# Patient Record
Sex: Male | Born: 1935 | Race: White | Hispanic: No | Marital: Married | Smoking: Former smoker
Health system: Southern US, Community
[De-identification: ages and names within clinical notes are randomized; demographics above are authoritative.]

## PROBLEM LIST (undated history)

## (undated) DIAGNOSIS — Z951 Presence of aortocoronary bypass graft: Secondary | ICD-10-CM

## (undated) DIAGNOSIS — K5730 Diverticulosis of large intestine without perforation or abscess without bleeding: Secondary | ICD-10-CM

## (undated) DIAGNOSIS — E7800 Pure hypercholesterolemia, unspecified: Secondary | ICD-10-CM

## (undated) DIAGNOSIS — G4733 Obstructive sleep apnea (adult) (pediatric): Secondary | ICD-10-CM

## (undated) DIAGNOSIS — J42 Unspecified chronic bronchitis: Secondary | ICD-10-CM

## (undated) DIAGNOSIS — IMO0001 Reserved for inherently not codable concepts without codable children: Secondary | ICD-10-CM

## (undated) DIAGNOSIS — J449 Chronic obstructive pulmonary disease, unspecified: Secondary | ICD-10-CM

## (undated) DIAGNOSIS — Z950 Presence of cardiac pacemaker: Secondary | ICD-10-CM

## (undated) DIAGNOSIS — M545 Other chronic pain: Secondary | ICD-10-CM

## (undated) DIAGNOSIS — J180 Bronchopneumonia, unspecified organism: Secondary | ICD-10-CM

## (undated) DIAGNOSIS — R519 Headache, unspecified: Secondary | ICD-10-CM

## (undated) DIAGNOSIS — I739 Peripheral vascular disease, unspecified: Secondary | ICD-10-CM

## (undated) DIAGNOSIS — E669 Obesity, unspecified: Secondary | ICD-10-CM

## (undated) DIAGNOSIS — F419 Anxiety disorder, unspecified: Secondary | ICD-10-CM

## (undated) DIAGNOSIS — M1990 Unspecified osteoarthritis, unspecified site: Secondary | ICD-10-CM

## (undated) DIAGNOSIS — J209 Acute bronchitis, unspecified: Secondary | ICD-10-CM

## (undated) DIAGNOSIS — H9190 Unspecified hearing loss, unspecified ear: Secondary | ICD-10-CM

## (undated) DIAGNOSIS — I2510 Atherosclerotic heart disease of native coronary artery without angina pectoris: Secondary | ICD-10-CM

## (undated) DIAGNOSIS — R51 Headache: Secondary | ICD-10-CM

## (undated) DIAGNOSIS — G8929 Other chronic pain: Secondary | ICD-10-CM

## (undated) DIAGNOSIS — I10 Essential (primary) hypertension: Secondary | ICD-10-CM

## (undated) DIAGNOSIS — I779 Disorder of arteries and arterioles, unspecified: Secondary | ICD-10-CM

## (undated) DIAGNOSIS — I679 Cerebrovascular disease, unspecified: Secondary | ICD-10-CM

## (undated) DIAGNOSIS — D126 Benign neoplasm of colon, unspecified: Secondary | ICD-10-CM

## (undated) DIAGNOSIS — I4891 Unspecified atrial fibrillation: Secondary | ICD-10-CM

## (undated) DIAGNOSIS — I509 Heart failure, unspecified: Secondary | ICD-10-CM

## (undated) DIAGNOSIS — I442 Atrioventricular block, complete: Secondary | ICD-10-CM

## (undated) HISTORY — DX: Obstructive sleep apnea (adult) (pediatric): G47.33

## (undated) HISTORY — DX: Anxiety disorder, unspecified: F41.9

## (undated) HISTORY — DX: Diverticulosis of large intestine without perforation or abscess without bleeding: K57.30

## (undated) HISTORY — DX: Unspecified chronic bronchitis: J42

## (undated) HISTORY — DX: Obesity, unspecified: E66.9

## (undated) HISTORY — PX: EYE SURGERY: SHX253

## (undated) HISTORY — DX: Atherosclerotic heart disease of native coronary artery without angina pectoris: I25.10

## (undated) HISTORY — DX: Presence of cardiac pacemaker: Z95.0

## (undated) HISTORY — DX: Cerebrovascular disease, unspecified: I67.9

## (undated) HISTORY — DX: Presence of aortocoronary bypass graft: Z95.1

## (undated) HISTORY — DX: Atrioventricular block, complete: I44.2

## (undated) HISTORY — DX: Peripheral vascular disease, unspecified: I73.9

## (undated) HISTORY — DX: Bronchopneumonia, unspecified organism: J18.0

## (undated) HISTORY — DX: Heart failure, unspecified: I50.9

## (undated) HISTORY — DX: Unspecified atrial fibrillation: I48.91

## (undated) HISTORY — DX: Pure hypercholesterolemia, unspecified: E78.00

## (undated) HISTORY — PX: ADENOIDECTOMY: SUR15

## (undated) HISTORY — DX: Essential (primary) hypertension: I10

## (undated) HISTORY — DX: Acute bronchitis, unspecified: J20.9

## (undated) HISTORY — DX: Unspecified osteoarthritis, unspecified site: M19.90

## (undated) HISTORY — DX: Benign neoplasm of colon, unspecified: D12.6

## (undated) HISTORY — DX: Chronic obstructive pulmonary disease, unspecified: J44.9

## (undated) HISTORY — DX: Low back pain: M54.5

## (undated) HISTORY — DX: Other chronic pain: M54.50

## (undated) HISTORY — DX: Other chronic pain: G89.29

## (undated) HISTORY — DX: Disorder of arteries and arterioles, unspecified: I77.9

---
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
## 1994-08-18 DIAGNOSIS — D126 Benign neoplasm of colon, unspecified: Secondary | ICD-10-CM

## 1994-08-18 HISTORY — DX: Benign neoplasm of colon, unspecified: D12.6

## 1998-08-27 ENCOUNTER — Other Ambulatory Visit: Admission: RE | Admit: 1998-08-27 | Discharge: 1998-08-27 | Payer: Self-pay | Admitting: Gastroenterology

## 1999-10-19 HISTORY — PX: OTHER SURGICAL HISTORY: SHX169

## 2000-06-17 ENCOUNTER — Observation Stay (HOSPITAL_COMMUNITY): Admission: RE | Admit: 2000-06-17 | Discharge: 2000-06-18 | Payer: Self-pay | Admitting: *Deleted

## 2000-06-17 ENCOUNTER — Encounter: Payer: Self-pay | Admitting: *Deleted

## 2000-10-18 HISTORY — PX: CAROTID ENDARTERECTOMY: SUR193

## 2001-03-02 ENCOUNTER — Encounter: Payer: Self-pay | Admitting: *Deleted

## 2001-03-02 ENCOUNTER — Ambulatory Visit (HOSPITAL_COMMUNITY): Admission: RE | Admit: 2001-03-02 | Discharge: 2001-03-02 | Payer: Self-pay | Admitting: *Deleted

## 2001-03-22 ENCOUNTER — Encounter (INDEPENDENT_AMBULATORY_CARE_PROVIDER_SITE_OTHER): Payer: Self-pay | Admitting: Specialist

## 2001-03-22 ENCOUNTER — Inpatient Hospital Stay (HOSPITAL_COMMUNITY): Admission: RE | Admit: 2001-03-22 | Discharge: 2001-03-23 | Payer: Self-pay | Admitting: Vascular Surgery

## 2001-04-24 ENCOUNTER — Inpatient Hospital Stay (HOSPITAL_COMMUNITY): Admission: RE | Admit: 2001-04-24 | Discharge: 2001-04-25 | Payer: Self-pay | Admitting: Vascular Surgery

## 2001-07-07 ENCOUNTER — Ambulatory Visit (HOSPITAL_COMMUNITY): Admission: RE | Admit: 2001-07-07 | Discharge: 2001-07-08 | Payer: Self-pay | Admitting: *Deleted

## 2003-09-18 ENCOUNTER — Ambulatory Visit (HOSPITAL_BASED_OUTPATIENT_CLINIC_OR_DEPARTMENT_OTHER): Admission: RE | Admit: 2003-09-18 | Discharge: 2003-09-18 | Payer: Self-pay | Admitting: Pulmonary Disease

## 2003-10-08 ENCOUNTER — Ambulatory Visit (HOSPITAL_COMMUNITY): Admission: RE | Admit: 2003-10-08 | Discharge: 2003-10-08 | Payer: Self-pay | Admitting: *Deleted

## 2003-10-19 DIAGNOSIS — Z951 Presence of aortocoronary bypass graft: Secondary | ICD-10-CM

## 2003-10-19 HISTORY — PX: CORONARY ARTERY BYPASS GRAFT: SHX141

## 2003-10-19 HISTORY — DX: Presence of aortocoronary bypass graft: Z95.1

## 2003-10-25 ENCOUNTER — Encounter (INDEPENDENT_AMBULATORY_CARE_PROVIDER_SITE_OTHER): Payer: Self-pay | Admitting: Specialist

## 2003-10-25 ENCOUNTER — Inpatient Hospital Stay (HOSPITAL_COMMUNITY): Admission: RE | Admit: 2003-10-25 | Discharge: 2003-11-04 | Payer: Self-pay | Admitting: Cardiothoracic Surgery

## 2003-12-02 ENCOUNTER — Encounter (HOSPITAL_COMMUNITY): Admission: RE | Admit: 2003-12-02 | Discharge: 2004-03-01 | Payer: Self-pay | Admitting: *Deleted

## 2004-03-05 ENCOUNTER — Inpatient Hospital Stay (HOSPITAL_COMMUNITY): Admission: AD | Admit: 2004-03-05 | Discharge: 2004-03-07 | Payer: Self-pay | Admitting: *Deleted

## 2004-03-18 ENCOUNTER — Encounter (HOSPITAL_COMMUNITY): Admission: RE | Admit: 2004-03-18 | Discharge: 2004-06-16 | Payer: Self-pay | Admitting: *Deleted

## 2004-09-02 ENCOUNTER — Ambulatory Visit: Payer: Self-pay | Admitting: Pulmonary Disease

## 2004-09-15 ENCOUNTER — Ambulatory Visit: Payer: Self-pay | Admitting: Pulmonary Disease

## 2005-04-08 ENCOUNTER — Ambulatory Visit: Payer: Self-pay | Admitting: Pulmonary Disease

## 2005-09-16 ENCOUNTER — Ambulatory Visit: Payer: Self-pay | Admitting: Pulmonary Disease

## 2006-03-02 ENCOUNTER — Ambulatory Visit: Payer: Self-pay | Admitting: Pulmonary Disease

## 2006-03-10 ENCOUNTER — Ambulatory Visit: Payer: Self-pay | Admitting: Pulmonary Disease

## 2006-09-22 ENCOUNTER — Ambulatory Visit: Payer: Self-pay | Admitting: Pulmonary Disease

## 2006-09-22 LAB — CONVERTED CEMR LAB
ALT: 21 units/L (ref 0–40)
Albumin: 3.8 g/dL (ref 3.5–5.2)
Alkaline Phosphatase: 73 units/L (ref 39–117)
BUN: 23 mg/dL (ref 6–23)
Bacteria, U Microscopic: NEGATIVE /hpf
Basophils Absolute: 0 10*3/uL (ref 0.0–0.1)
Basophils Relative: 0.1 % (ref 0.0–1.0)
Calcium: 9.3 mg/dL (ref 8.4–10.5)
Cholesterol: 130 mg/dL (ref 0–200)
Epithelial cells, urine: NEGATIVE /lpf
Glomerular Filtration Rate, Af Am: 84 mL/min/{1.73_m2}
Glucose, Bld: 109 mg/dL — ABNORMAL HIGH (ref 70–99)
HDL: 32.2 mg/dL — ABNORMAL LOW (ref >39.0)
Hemoglobin, Urine: NEGATIVE
Hemoglobin: 16.6 g/dL (ref 13.0–17.0)
Hgb A1c MFr Bld: 5.6 % (ref 4.6–6.0)
LDL Cholesterol: 68 mg/dL (ref 0–99)
Lymphocytes Relative: 21.3 % (ref 12.0–46.0)
Monocytes Relative: 6.4 % (ref 3.0–11.0)
PSA: 1.3 ng/mL (ref 0.10–4.00)
Platelets: 155 10*3/uL (ref 150–400)
Potassium: 4.5 meq/L (ref 3.5–5.1)
RDW: 12.4 % (ref 11.5–14.6)
TSH: 0.98 microintl units/mL (ref 0.35–5.50)
Total Bilirubin: 1 mg/dL (ref 0.3–1.2)
Total Protein: 6.5 g/dL (ref 6.0–8.3)
WBC: 10.3 10*3/uL (ref 4.5–10.5)

## 2006-09-29 ENCOUNTER — Ambulatory Visit: Payer: Self-pay | Admitting: Gastroenterology

## 2006-10-04 ENCOUNTER — Encounter (INDEPENDENT_AMBULATORY_CARE_PROVIDER_SITE_OTHER): Payer: Self-pay | Admitting: *Deleted

## 2006-10-04 ENCOUNTER — Ambulatory Visit: Payer: Self-pay | Admitting: Gastroenterology

## 2006-12-05 ENCOUNTER — Ambulatory Visit: Payer: Self-pay | Admitting: Pulmonary Disease

## 2007-03-23 ENCOUNTER — Ambulatory Visit: Payer: Self-pay | Admitting: Pulmonary Disease

## 2007-07-06 ENCOUNTER — Ambulatory Visit (HOSPITAL_COMMUNITY): Admission: RE | Admit: 2007-07-06 | Discharge: 2007-07-06 | Payer: Self-pay | Admitting: Cardiovascular Disease

## 2007-09-26 ENCOUNTER — Ambulatory Visit: Payer: Self-pay | Admitting: Pulmonary Disease

## 2007-10-05 ENCOUNTER — Encounter: Admission: RE | Admit: 2007-10-05 | Discharge: 2007-10-18 | Payer: Self-pay | Admitting: Pulmonary Disease

## 2007-10-05 ENCOUNTER — Encounter: Payer: Self-pay | Admitting: Pulmonary Disease

## 2007-10-07 DIAGNOSIS — I739 Peripheral vascular disease, unspecified: Secondary | ICD-10-CM | POA: Insufficient documentation

## 2007-10-07 DIAGNOSIS — I679 Cerebrovascular disease, unspecified: Secondary | ICD-10-CM

## 2007-10-07 DIAGNOSIS — G4733 Obstructive sleep apnea (adult) (pediatric): Secondary | ICD-10-CM

## 2007-10-07 DIAGNOSIS — K5730 Diverticulosis of large intestine without perforation or abscess without bleeding: Secondary | ICD-10-CM | POA: Insufficient documentation

## 2007-10-07 DIAGNOSIS — K649 Unspecified hemorrhoids: Secondary | ICD-10-CM | POA: Insufficient documentation

## 2007-10-07 DIAGNOSIS — D126 Benign neoplasm of colon, unspecified: Secondary | ICD-10-CM

## 2007-10-07 DIAGNOSIS — F411 Generalized anxiety disorder: Secondary | ICD-10-CM | POA: Insufficient documentation

## 2007-10-07 DIAGNOSIS — M1990 Unspecified osteoarthritis, unspecified site: Secondary | ICD-10-CM | POA: Insufficient documentation

## 2007-10-07 DIAGNOSIS — M545 Low back pain: Secondary | ICD-10-CM | POA: Insufficient documentation

## 2007-10-07 DIAGNOSIS — E7800 Pure hypercholesterolemia, unspecified: Secondary | ICD-10-CM | POA: Insufficient documentation

## 2007-10-07 DIAGNOSIS — J449 Chronic obstructive pulmonary disease, unspecified: Secondary | ICD-10-CM

## 2007-10-07 DIAGNOSIS — I2510 Atherosclerotic heart disease of native coronary artery without angina pectoris: Secondary | ICD-10-CM | POA: Insufficient documentation

## 2007-10-07 DIAGNOSIS — I10 Essential (primary) hypertension: Secondary | ICD-10-CM

## 2007-10-07 LAB — CONVERTED CEMR LAB
ALT: 28 units/L (ref 0–53)
AST: 23 units/L (ref 0–37)
Alkaline Phosphatase: 64 units/L (ref 39–117)
BUN: 20 mg/dL (ref 6–23)
Basophils Relative: 0 % (ref 0.0–1.0)
Bilirubin, Direct: 0.1 mg/dL (ref 0.0–0.3)
CO2: 32 meq/L (ref 19–32)
Calcium: 9.5 mg/dL (ref 8.4–10.5)
Chloride: 102 meq/L (ref 96–112)
Creatinine, Ser: 1.1 mg/dL (ref 0.4–1.5)
Eosinophils Relative: 0.6 % (ref 0.0–5.0)
GFR calc Af Amer: 84 mL/min
Glucose, Bld: 121 mg/dL — ABNORMAL HIGH (ref 70–99)
Lymphocytes Relative: 21.7 % (ref 12.0–46.0)
Monocytes Relative: 7.5 % (ref 3.0–11.0)
Platelets: 163 10*3/uL (ref 150–400)
RBC: 5.24 M/uL (ref 4.22–5.81)
RDW: 12.5 % (ref 11.5–14.6)
Total Bilirubin: 0.9 mg/dL (ref 0.3–1.2)
Total Protein: 7 g/dL (ref 6.0–8.3)
WBC: 11 10*3/uL — ABNORMAL HIGH (ref 4.5–10.5)

## 2007-10-24 ENCOUNTER — Encounter: Admission: RE | Admit: 2007-10-24 | Discharge: 2007-11-02 | Payer: Self-pay | Admitting: Pulmonary Disease

## 2007-11-01 ENCOUNTER — Encounter: Payer: Self-pay | Admitting: Pulmonary Disease

## 2007-11-24 ENCOUNTER — Telehealth: Payer: Self-pay | Admitting: Pulmonary Disease

## 2007-12-06 ENCOUNTER — Encounter: Payer: Self-pay | Admitting: Pulmonary Disease

## 2007-12-07 ENCOUNTER — Encounter: Payer: Self-pay | Admitting: Pulmonary Disease

## 2007-12-28 ENCOUNTER — Ambulatory Visit: Payer: Self-pay | Admitting: Pulmonary Disease

## 2007-12-28 DIAGNOSIS — J441 Chronic obstructive pulmonary disease with (acute) exacerbation: Secondary | ICD-10-CM | POA: Insufficient documentation

## 2008-01-15 ENCOUNTER — Telehealth: Payer: Self-pay | Admitting: Pulmonary Disease

## 2008-01-29 ENCOUNTER — Ambulatory Visit: Payer: Self-pay | Admitting: Pulmonary Disease

## 2008-01-30 ENCOUNTER — Ambulatory Visit: Payer: Self-pay | Admitting: Cardiology

## 2008-02-02 ENCOUNTER — Inpatient Hospital Stay (HOSPITAL_COMMUNITY): Admission: EM | Admit: 2008-02-02 | Discharge: 2008-02-06 | Payer: Self-pay | Admitting: Emergency Medicine

## 2008-02-02 HISTORY — PX: CARDIAC CATHETERIZATION: SHX172

## 2008-02-04 ENCOUNTER — Telehealth: Payer: Self-pay | Admitting: Pulmonary Disease

## 2008-02-06 ENCOUNTER — Encounter: Payer: Self-pay | Admitting: Pulmonary Disease

## 2008-02-07 ENCOUNTER — Telehealth (INDEPENDENT_AMBULATORY_CARE_PROVIDER_SITE_OTHER): Payer: Self-pay | Admitting: *Deleted

## 2008-02-07 ENCOUNTER — Telehealth: Payer: Self-pay | Admitting: Pulmonary Disease

## 2008-04-08 ENCOUNTER — Ambulatory Visit: Payer: Self-pay | Admitting: Pulmonary Disease

## 2008-04-11 ENCOUNTER — Inpatient Hospital Stay (HOSPITAL_COMMUNITY): Admission: EM | Admit: 2008-04-11 | Discharge: 2008-04-19 | Payer: Self-pay | Admitting: Emergency Medicine

## 2008-04-12 DIAGNOSIS — Z950 Presence of cardiac pacemaker: Secondary | ICD-10-CM

## 2008-04-12 HISTORY — DX: Presence of cardiac pacemaker: Z95.0

## 2008-04-16 ENCOUNTER — Ambulatory Visit: Payer: Self-pay | Admitting: Internal Medicine

## 2008-04-17 HISTORY — PX: PACEMAKER PLACEMENT: SHX43

## 2008-04-26 ENCOUNTER — Encounter: Payer: Self-pay | Admitting: Pulmonary Disease

## 2008-05-04 ENCOUNTER — Inpatient Hospital Stay (HOSPITAL_COMMUNITY): Admission: EM | Admit: 2008-05-04 | Discharge: 2008-05-11 | Payer: Self-pay | Admitting: Pulmonary Disease

## 2008-05-15 ENCOUNTER — Encounter: Payer: Self-pay | Admitting: Pulmonary Disease

## 2008-08-26 ENCOUNTER — Ambulatory Visit: Payer: Self-pay | Admitting: Pulmonary Disease

## 2008-08-26 DIAGNOSIS — J180 Bronchopneumonia, unspecified organism: Secondary | ICD-10-CM | POA: Insufficient documentation

## 2008-10-01 ENCOUNTER — Ambulatory Visit: Payer: Self-pay | Admitting: Pulmonary Disease

## 2008-10-01 DIAGNOSIS — I4891 Unspecified atrial fibrillation: Secondary | ICD-10-CM

## 2008-10-01 DIAGNOSIS — Z950 Presence of cardiac pacemaker: Secondary | ICD-10-CM | POA: Insufficient documentation

## 2008-10-06 LAB — CONVERTED CEMR LAB
Albumin: 3.6 g/dL (ref 3.5–5.2)
Alkaline Phosphatase: 75 units/L (ref 39–117)
BUN: 23 mg/dL (ref 6–23)
Basophils Relative: 1.9 % (ref 0.0–3.0)
Calcium: 9.2 mg/dL (ref 8.4–10.5)
Cholesterol: 138 mg/dL (ref 0–200)
Creatinine, Ser: 1 mg/dL (ref 0.4–1.5)
Eosinophils Absolute: 0 10*3/uL (ref 0.0–0.7)
GFR calc Af Amer: 94 mL/min
GFR calc non Af Amer: 77 mL/min
Hemoglobin: 17.3 g/dL — ABNORMAL HIGH (ref 13.0–17.0)
Hgb A1c MFr Bld: 6.1 % — ABNORMAL HIGH (ref 4.6–6.0)
LDL Cholesterol: 60 mg/dL (ref 0–99)
Lymphocytes Relative: 20.1 % (ref 12.0–46.0)
MCHC: 34.8 g/dL (ref 30.0–36.0)
Monocytes Absolute: 0.5 10*3/uL (ref 0.1–1.0)
Monocytes Relative: 4.8 % (ref 3.0–12.0)
Neutro Abs: 7.2 10*3/uL (ref 1.4–7.7)
Neutrophils Relative %: 72.9 % (ref 43.0–77.0)
PSA: 1.54 ng/mL (ref 0.10–4.00)
Potassium: 3.8 meq/L (ref 3.5–5.1)
Pro B Natriuretic peptide (BNP): 203 pg/mL — ABNORMAL HIGH (ref 0.0–100.0)
RBC: 5.14 M/uL (ref 4.22–5.81)
RDW: 13.4 % (ref 11.5–14.6)
TSH: 0.9 microintl units/mL (ref 0.35–5.50)
Total CHOL/HDL Ratio: 4
VLDL: 44 mg/dL — ABNORMAL HIGH (ref 0–40)

## 2008-10-07 ENCOUNTER — Telehealth (INDEPENDENT_AMBULATORY_CARE_PROVIDER_SITE_OTHER): Payer: Self-pay | Admitting: *Deleted

## 2008-10-23 ENCOUNTER — Telehealth (INDEPENDENT_AMBULATORY_CARE_PROVIDER_SITE_OTHER): Payer: Self-pay | Admitting: *Deleted

## 2008-12-09 ENCOUNTER — Telehealth (INDEPENDENT_AMBULATORY_CARE_PROVIDER_SITE_OTHER): Payer: Self-pay | Admitting: *Deleted

## 2008-12-24 ENCOUNTER — Ambulatory Visit: Payer: Self-pay | Admitting: Pulmonary Disease

## 2008-12-31 ENCOUNTER — Ambulatory Visit: Payer: Self-pay | Admitting: Internal Medicine

## 2009-01-09 ENCOUNTER — Ambulatory Visit: Payer: Self-pay | Admitting: Pulmonary Disease

## 2009-01-09 ENCOUNTER — Telehealth (INDEPENDENT_AMBULATORY_CARE_PROVIDER_SITE_OTHER): Payer: Self-pay | Admitting: *Deleted

## 2009-01-10 LAB — CONVERTED CEMR LAB
ALT: 23 units/L (ref 0–53)
Alkaline Phosphatase: 58 units/L (ref 39–117)
BUN: 27 mg/dL — ABNORMAL HIGH (ref 6–23)
Basophils Relative: 3.4 % — ABNORMAL HIGH (ref 0.0–3.0)
Bilirubin, Direct: 0.2 mg/dL (ref 0.0–0.3)
Calcium: 9 mg/dL (ref 8.4–10.5)
Creatinine, Ser: 1.2 mg/dL (ref 0.4–1.5)
Eosinophils Relative: 0.4 % (ref 0.0–5.0)
GFR calc non Af Amer: 62.61 mL/min (ref >60)
Lymphocytes Relative: 18.3 % (ref 12.0–46.0)
Neutrophils Relative %: 70.9 % (ref 43.0–77.0)
Platelets: 140 10*3/uL — ABNORMAL LOW (ref 150.0–400.0)
RBC: 5.04 M/uL (ref 4.22–5.81)
Total Bilirubin: 0.9 mg/dL (ref 0.3–1.2)
Total Protein: 6.5 g/dL (ref 6.0–8.3)
WBC: 11.1 10*3/uL — ABNORMAL HIGH (ref 4.5–10.5)

## 2009-02-03 ENCOUNTER — Ambulatory Visit: Payer: Self-pay | Admitting: Pulmonary Disease

## 2009-03-31 ENCOUNTER — Ambulatory Visit: Payer: Self-pay | Admitting: Pulmonary Disease

## 2009-04-23 ENCOUNTER — Encounter: Payer: Self-pay | Admitting: Pulmonary Disease

## 2009-05-12 ENCOUNTER — Ambulatory Visit (HOSPITAL_COMMUNITY): Admission: RE | Admit: 2009-05-12 | Discharge: 2009-05-12 | Payer: Self-pay | Admitting: Cardiovascular Disease

## 2009-06-03 ENCOUNTER — Encounter: Payer: Self-pay | Admitting: Pulmonary Disease

## 2009-07-01 ENCOUNTER — Ambulatory Visit: Payer: Self-pay | Admitting: Pulmonary Disease

## 2009-08-14 ENCOUNTER — Encounter: Payer: Self-pay | Admitting: Pulmonary Disease

## 2009-08-21 ENCOUNTER — Encounter (INDEPENDENT_AMBULATORY_CARE_PROVIDER_SITE_OTHER): Payer: Self-pay | Admitting: *Deleted

## 2009-09-17 ENCOUNTER — Encounter: Payer: Self-pay | Admitting: Pulmonary Disease

## 2009-10-03 ENCOUNTER — Telehealth: Payer: Self-pay | Admitting: Pulmonary Disease

## 2009-10-07 ENCOUNTER — Ambulatory Visit: Payer: Self-pay | Admitting: Pulmonary Disease

## 2009-10-14 ENCOUNTER — Ambulatory Visit: Payer: Self-pay | Admitting: Pulmonary Disease

## 2009-10-14 LAB — CONVERTED CEMR LAB
ALT: 18 units/L (ref 0–53)
AST: 18 units/L (ref 0–37)
Albumin: 3.5 g/dL (ref 3.5–5.2)
Alkaline Phosphatase: 62 units/L (ref 39–117)
Basophils Absolute: 0 10*3/uL (ref 0.0–0.1)
Bilirubin, Direct: 0 mg/dL (ref 0.0–0.3)
CO2: 32 meq/L (ref 19–32)
Chloride: 104 meq/L (ref 96–112)
Cholesterol: 132 mg/dL (ref 0–200)
Eosinophils Relative: 0.6 % (ref 0.0–5.0)
Glucose, Bld: 90 mg/dL (ref 70–99)
HCT: 50.1 % (ref 39.0–52.0)
Hemoglobin: 16.9 g/dL (ref 13.0–17.0)
Lymphocytes Relative: 25 % (ref 12.0–46.0)
Lymphs Abs: 2.5 10*3/uL (ref 0.7–4.0)
Monocytes Relative: 9.3 % (ref 3.0–12.0)
PSA: 1.65 ng/mL (ref 0.10–4.00)
Platelets: 133 10*3/uL — ABNORMAL LOW (ref 150.0–400.0)
Potassium: 3.8 meq/L (ref 3.5–5.1)
RDW: 13.3 % (ref 11.5–14.6)
Sodium: 146 meq/L — ABNORMAL HIGH (ref 135–145)
Total CHOL/HDL Ratio: 4
Total Protein: 6.3 g/dL (ref 6.0–8.3)
Triglycerides: 253 mg/dL — ABNORMAL HIGH (ref 0.0–149.0)
VLDL: 50.6 mg/dL — ABNORMAL HIGH (ref 0.0–40.0)
WBC: 9.8 10*3/uL (ref 4.5–10.5)

## 2009-10-16 ENCOUNTER — Telehealth (INDEPENDENT_AMBULATORY_CARE_PROVIDER_SITE_OTHER): Payer: Self-pay | Admitting: *Deleted

## 2009-10-20 ENCOUNTER — Telehealth: Payer: Self-pay | Admitting: Pulmonary Disease

## 2010-02-09 ENCOUNTER — Encounter: Payer: Self-pay | Admitting: Pulmonary Disease

## 2010-02-16 ENCOUNTER — Encounter: Payer: Self-pay | Admitting: Pulmonary Disease

## 2010-02-24 ENCOUNTER — Telehealth: Payer: Self-pay | Admitting: Pulmonary Disease

## 2010-03-09 IMAGING — CT CT HEAD W/O CM
1 series · 16 of 30 positions shown, 20 images · non-contrast
Comparison: None

CLINICAL DATA: Syncope and headache

CT HEAD WITHOUT CONTRAST
TECHNIQUE: Contiguous axial images were obtained from the base of
the skull through the vertex without contrast.

[Series 2: brain · axial · 0.47mm/px · z∈[+120,+275]mm · 16 of 32 slices shown, 20 images]
[im 2/32  brain]
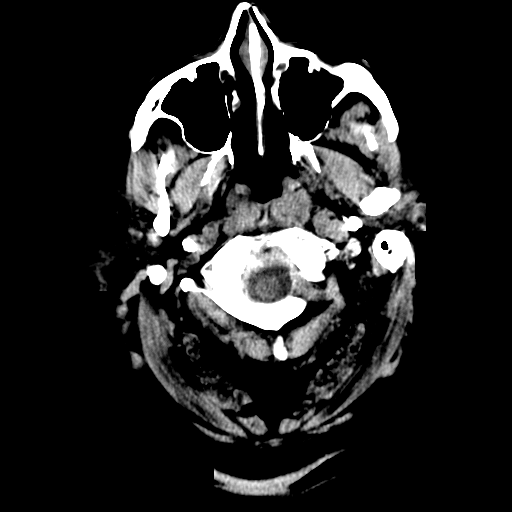
[im 2/32  bone]
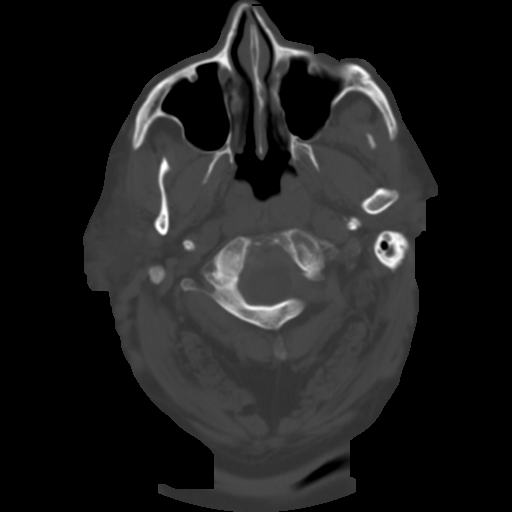
[im 4/32  brain]
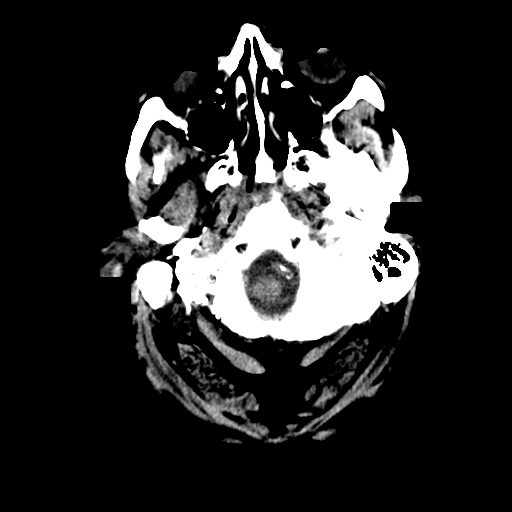
[im 6/32  brain]
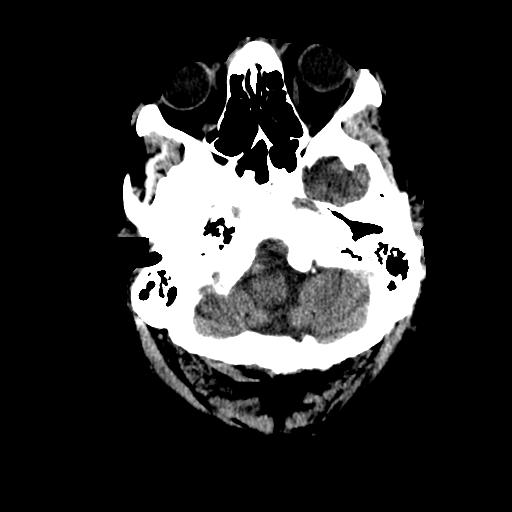
[im 8/32  brain]
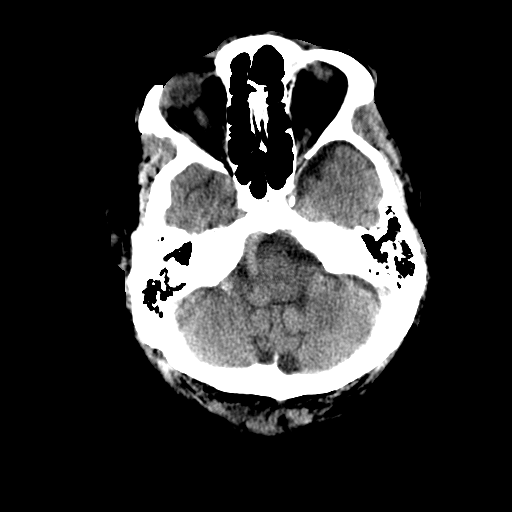
[im 9/32  brain]
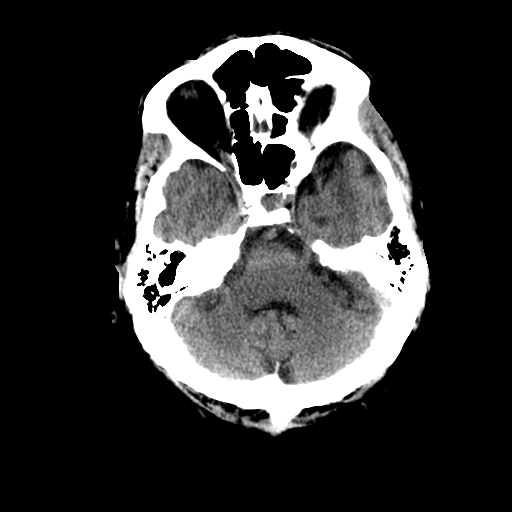
[im 9/32  bone]
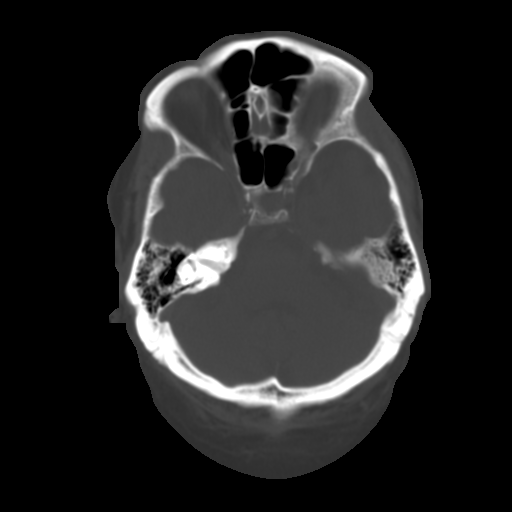
[im 11/32  brain]
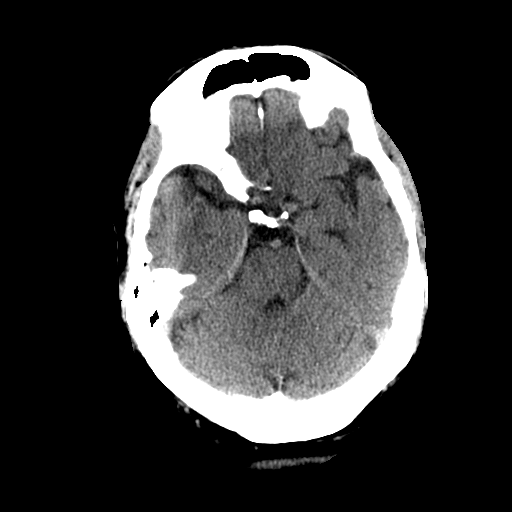
[im 13/32  brain]
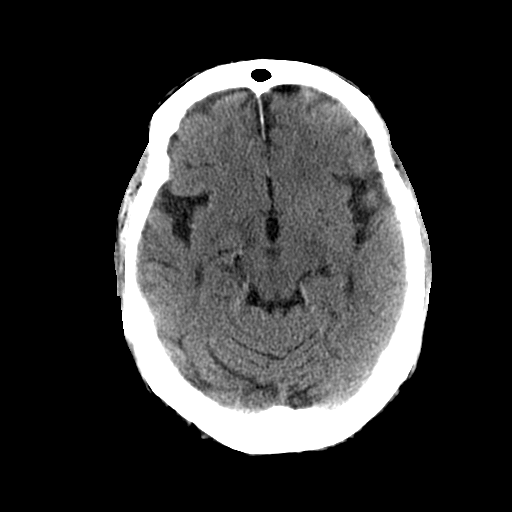
[im 15/32  brain]
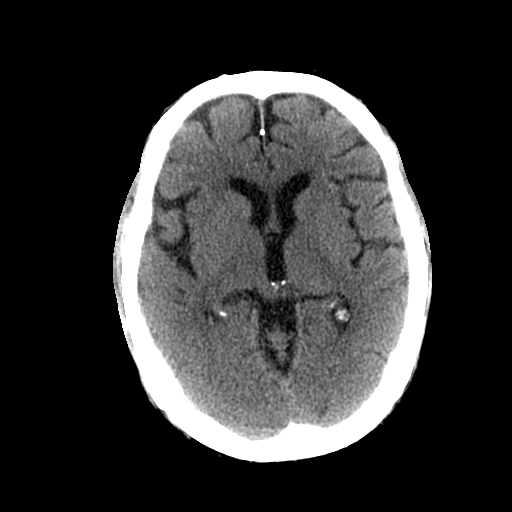
[im 17/32  brain]
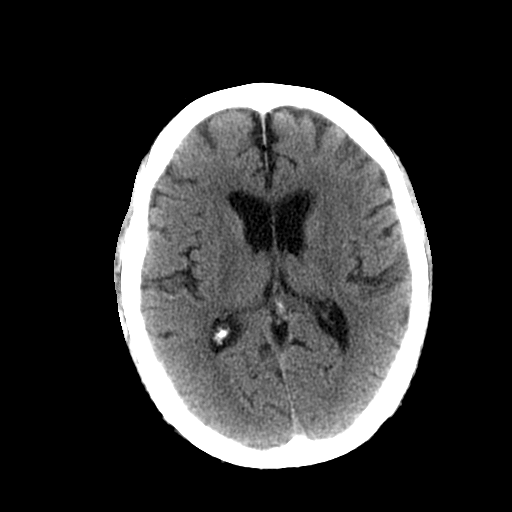
[im 17/32  bone]
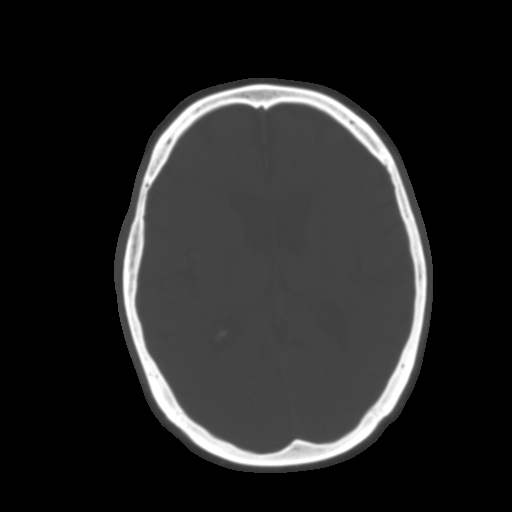
[im 19/32  brain]
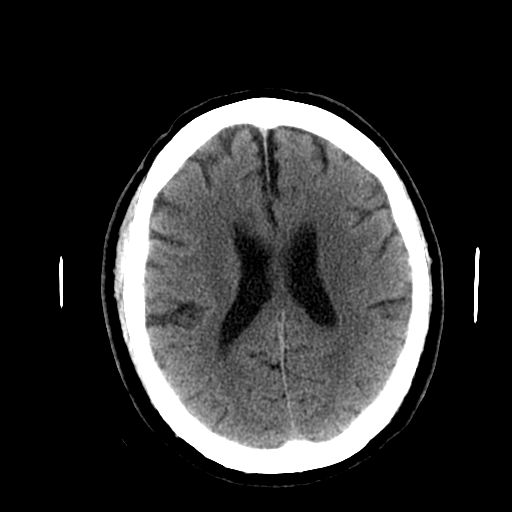
[im 21/32  brain]
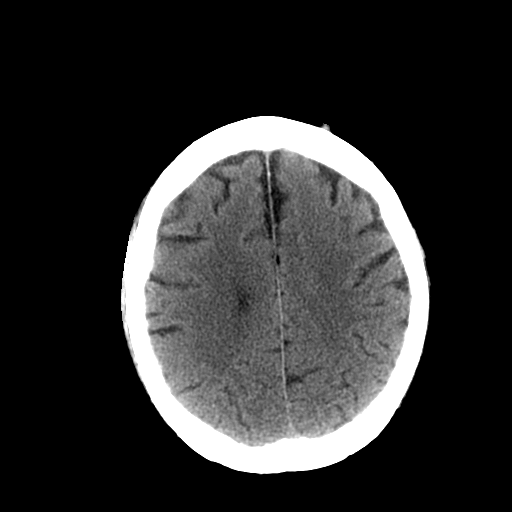
[im 23/32  brain]
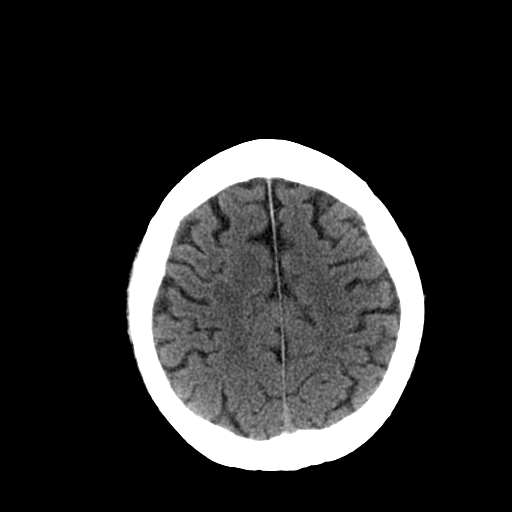
[im 24/32  brain]
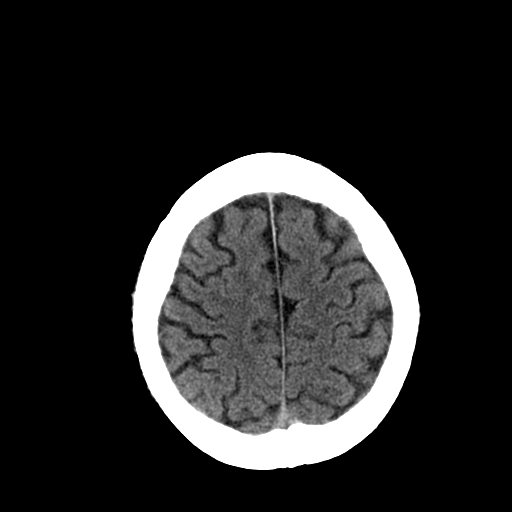
[im 24/32  bone]
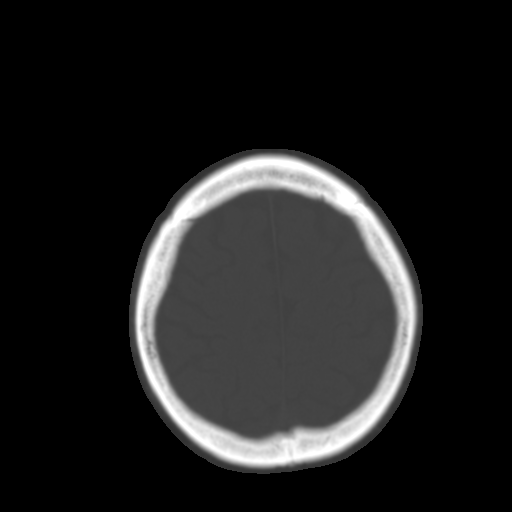
[im 26/32  brain]
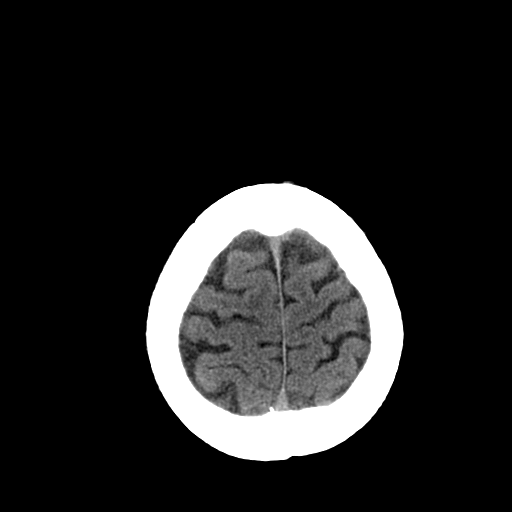
[im 28/32  brain]
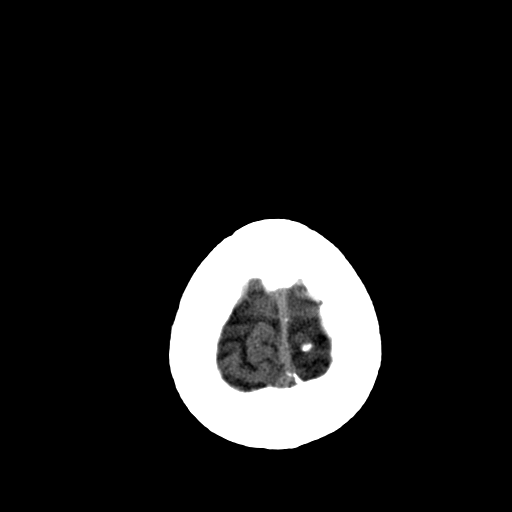
[im 30/32  brain]
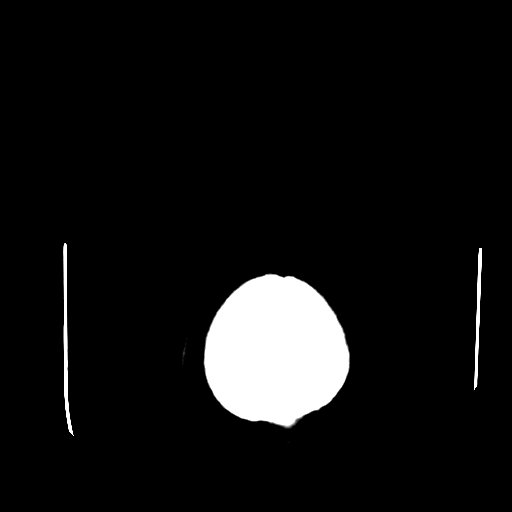

[16 of 30 positions shown; findings below may reference images not displayed]

FINDINGS: There is no intra or extra-axial hemorrhage.  The
ventricles are normal in size.  No evidence of acute infarct, mass
effect, or midline shift.  The calvarium is intact.  The visualized
paranasal sinuses and mastoid air cells are clear.
IMPRESSION: No evidence of acute intracranial abnormality.

## 2010-04-02 ENCOUNTER — Telehealth: Payer: Self-pay | Admitting: Gastroenterology

## 2010-04-13 ENCOUNTER — Ambulatory Visit: Payer: Self-pay | Admitting: Pulmonary Disease

## 2010-08-06 ENCOUNTER — Encounter: Payer: Self-pay | Admitting: Pulmonary Disease

## 2010-08-17 ENCOUNTER — Encounter: Payer: Self-pay | Admitting: Pulmonary Disease

## 2010-09-04 HISTORY — PX: NM MYOVIEW LTD: HXRAD82

## 2010-09-25 ENCOUNTER — Encounter: Payer: Self-pay | Admitting: Pulmonary Disease

## 2010-10-14 ENCOUNTER — Ambulatory Visit: Payer: Self-pay | Admitting: Pulmonary Disease

## 2010-10-14 ENCOUNTER — Ambulatory Visit
Admission: RE | Admit: 2010-10-14 | Discharge: 2010-10-14 | Payer: Self-pay | Source: Home / Self Care | Attending: Pulmonary Disease | Admitting: Pulmonary Disease

## 2010-10-19 LAB — CONVERTED CEMR LAB
ALT: 21 units/L (ref 0–53)
AST: 23 units/L (ref 0–37)
BUN: 23 mg/dL (ref 6–23)
CO2: 30 meq/L (ref 19–32)
Chloride: 104 meq/L (ref 96–112)
Cholesterol: 144 mg/dL (ref 0–200)
Creatinine, Ser: 0.9 mg/dL (ref 0.4–1.5)
Eosinophils Absolute: 0.1 10*3/uL (ref 0.0–0.7)
Eosinophils Relative: 0.5 % (ref 0.0–5.0)
Glucose, Bld: 103 mg/dL — ABNORMAL HIGH (ref 70–99)
HDL: 33.3 mg/dL — ABNORMAL LOW (ref >39.00)
LDL Cholesterol: 71 mg/dL (ref 0–99)
Lymphocytes Relative: 21.6 % (ref 12.0–46.0)
MCV: 93.7 fL (ref 78.0–100.0)
Monocytes Absolute: 0.8 10*3/uL (ref 0.1–1.0)
Neutrophils Relative %: 70.8 % (ref 43.0–77.0)
Platelets: 149 10*3/uL — ABNORMAL LOW (ref 150.0–400.0)
Potassium: 3.9 meq/L (ref 3.5–5.1)
Total Bilirubin: 0.7 mg/dL (ref 0.3–1.2)
Total Protein: 6.4 g/dL (ref 6.0–8.3)
Triglycerides: 200 mg/dL — ABNORMAL HIGH (ref 0.0–149.0)
WBC: 11.2 10*3/uL — ABNORMAL HIGH (ref 4.5–10.5)

## 2010-11-17 NOTE — Progress Notes (Signed)
Summary: Schedule Colonoscopy  Phone Note Outgoing Call Call back at Home Phone [phone]   Call placed by: Harlow Mares CMA Duncan Dull),  April 02, 2010 8:47 AM Call placed to: Patient Summary of Call: Left message on patients machine to call back. patient is due for colonoscopy Initial call taken by: Harlow Mares CMA Duncan Dull),  April 02, 2010 8:47 AM  Follow-up for Phone Call        Left a message on the patient machine to call back and schedule a previsit and procedure with our office. A letter will be mailed to the patient.   Follow-up by: Harlow Mares CMA (AAMA),  April 23, 2010 3:01 PM

## 2010-11-17 NOTE — Progress Notes (Signed)
Summary: rx request/ flu-like symptoms  Phone Note Call from Patient   Caller: Spouse Call For: nadel Summary of Call: pt's spouse says that pt is still in bed- not feeling any better from cold than he did when he was last seen recently for cpx. coughing/ chills/ low-grade fever. spouse is [facility] [address] (spouse says she doesn't care that ins. won't cover this- says doxicycline just isn't helping). call spouse at work # [phone] Initial call taken by: Tivis Ringer,  October 20, 2009 10:45 AM  Follow-up for Phone Call        Please advise. Zackery Barefoot CMA  October 20, 2009 12:35 P   per SN----change to avelox  400mg   #7  1 by mouth once daily until gone and refill x 1.  thanks Randell Loop CMA  October 21, 2009 12:21 PM   rx sent. pt aware.Carron Curie CMA  October 21, 2009 12:43 PM     New/Updated Medications: AVELOX 400 MG TABS (MOXIFLOXACIN HCL) Take 1 tablet by mouth once a day Prescriptions: AVELOX 400 MG TABS (MOXIFLOXACIN HCL) Take 1 tablet by mouth once a day  #7 x 1   Entered by:   Carron Curie CMA   Authorized by:   Michele Mcalpine MD   Signed by:   Carron Curie CMA on 10/21/2009   Method used:   [facility] to        [facility] [address] [phone]* (retail)       [address]       [place], [place]  [address]       Ph: [phone] or [phone]       Fax: [phone]   RxID:   [id number]

## 2010-11-17 NOTE — Assessment & Plan Note (Signed)
Summary: 6 MONTHS/ MBW   CC:  6 month ROV & review of mult medical problems....  History of Present Illness: 77 y/o WM here for a follow up visit... he has mult med problems as noted below...  Followed for COPD/ AB/ ex-smoker, HBP/ ASHD/ AFib/ Pacer- followed by DrBerry/ [facility], cerebrovasc & peripheral vasc disease, Hyperlipidemia/ Obesity, DJD/ LBP, Anxiety, etc...   ~  October 14, 2009:  here for 3mo ROV w/ another exac- cough, congestion, sore throat, brown sputum, & incr SOB w/ wheezing etc... doesn't want Avelox due to cost- discussed trying Depo80/ Medrol dosepak, Doxycycline + his Symbicort/ Spiriva/ Singulair/ Mucinex regularly w/ fluids etc... he has gained to 252# & MUST diet, incr exercise & get weight down if he wants to survive...    ~  April 13, 2010:  no luck w/ weight reduction but then again he is not really on diet or exercising & we discussed this... [facility]- pacer function normally & 2DEcho showed mod conc LVH, mod reduced LVF w/ EF= 35-40% w/ apical AK, dil RA... he is c/o a skin lesion ?SK? on vertex & I advised excision by Derm (he wants to call on his own to set this up)... needs refill Vicodin & Xanax...    Current Problem List:  BRONCHITIS, CHRONIC, ACUTE EXACERBATION (ICD-491.21) >> see prev notes... COPD (ICD-496) - ex-smoker, freq flairs when off Rx... now taking: SYMBICORT 160- 2spBid,  SPIRIVA daily, SINGULAIR 10mg /d,  MUCINEX 1-2 Bid w/ fluids, + FLONASE...  ~  baseline CXR shows COPD, prev median sternotomy, NAD.Marland KitchenMarland Kitchen   ~   CT CHEST 4/09 w/ extensive coronary calcif, prior CABG, no signif abn in the lungs.  ~  PFT's 4/09 showed FVC=2.88 (71%), FEV1=2.13 (67%), FEV1/ FVC= 74%, mid-flows= 51%pred...  ~  CXR 3/10 showed sl cardiomeg, pleural thickening, pacer, DJD sp, NAD...  OBSTRUCTIVE SLEEP APNEA (ICD-327.23) - he does not tolerate his CPAP...  HYPERTENSION (ICD-401.9) controlled on meds:  METOPROLOL 50mg Bid, COZAAR 100/d, CARDIZEM CD 180mg /d,  & HCTZ 12.5mg /d... BP today = 150/80 and not really checking BP at home as requested... advised to elim sodium, get on deit & get weight down!!!  ARTERIOSCLEROTIC HEART DISEASE (ICD-414.00) - follow by [facility]... he is s/p CABG 1/05... he is on ASA 81mg /d,  now COUMADIN, + meds as listed above... note 2DEcho w/ LVD & EF= 35-40%...  ~   CT CHEST 4/09 w/ extensive coronary calcif, prior CABG, no signif abn in the lungs.  ~  cath 4/09 showed patent grafts, some disease in Circ & RCA, patent stent to left renal art... med Rx.   ~  2DEcho 5/11 showed mod conc LVH, mod reduced LVF w/ EF= 35-40% w/ apical AK, dil RA...  ATRIAL FIBRILLATION (ICD-427.31) CARDIAC PACEMAKER IN SITU (ICD-V45.01) - he had syncope w/ 4 fx right ribs and CHB & AFib requiring pacer & Coumadin started... [facility] Dr[facility]... seen 4/11 & pacer check OK, no changes made...  CEREBROVASCULAR DISEASE (ICD-437.9) - S/P Bilat CAE's 2000 & doppler's are followed by DrBerry...   ~  last CDoppler reported by [facility] 4/09 showed mod distal right CCA stenosis.  PERIPHERAL VASCULAR DISEASE (ICD-443.9) - S/P Rt fem art thrombectomy 2000, and Rt renal art stent 2002... he has all this followed by [facility] now & last doppler & cath showed patent stent.  HYPERCHOLESTEROLEMIA (ICD-272.0) - controlled on SIMVASTATIN 80mg /d & Fish Oil and labs followed by DrBerry (pt will ask for copies to be  sent to [place])... he recently added NIASPAN for HDL of 31, but this was stopped due to flushing/ intol...   ~  FLP 12/09 showed TChol 138, TG 220, HDL 34, LDL 60  ~  FLP 12/10 showed TChol 132, TG 253, HDL 33, LDL 61  OBESITY (ICD-278.00) - weight fluctuates betw 240-250# & he has been unable to lose weight... he is sedentary and we discussed an exercise program for him, since he is limited by LBP.  ~  NOTE:  labs 12/09 showed BS= 131, HgA1c= 6.1.[name]. advised low carb diet, get wt down!  ~  6/10: wt today= 249#, but pt states down to 233# at home- "just decr  portions"  ~  12/10:  wt today= 252# post holiday... he states "I'm starving myself to death"...  ~  04/08/23:  weight = 252#.[name]. states he's lost 4" at the waist...  DIVERTICULOSIS OF COLON (ICD-562.10) COLONIC POLYPS (ICD-211.3) HEMORRHOIDS (ICD-455.6)  ~  last colonoscopy was 12/07 by DrStark and showed divertic, polyps 2-2mm size (ademomas), and hems... f/u planned 44yrs.  DEGENERATIVE JOINT DISEASE (ICD-715.90) LOW BACK PAIN, CHRONIC (ICD-724.2)  ~  he uses DCN 100 for pain and prev requested refill of #360 for 3 month supply...  ~  12/10: DCN now off the market & Rx for Vicodin #270- up to 3/d as needed for pain.  ANXIETY (ICD-300.00) - he uses ALPRAZOLAM 0.5mg  Tid and prev requested #270 for 3 month supply...   Preventive Screening-Counseling & Management  Alcohol-Tobacco     Smoking Status: quit     Year Quit: 1998  Allergies: 1)  ! Pcn  Comments:  Nurse/Medical Assistant: The patient's medications and allergies were reviewed with the patient and were updated in the Medication and Allergy Lists.  Past History:  Past Medical History: OBSTRUCTIVE SLEEP APNEA (ICD-327.23) Hx of BRONCHIAL PNEUMONIA (ICD-485) BRONCHITIS, CHRONIC, ACUTE EXACERBATION (ICD-491.21) COPD (ICD-496) HYPERTENSION (ICD-401.9) ARTERIOSCLEROTIC HEART DISEASE (ICD-414.00) ATRIAL FIBRILLATION (ICD-427.31) CARDIAC PACEMAKER IN SITU (ICD-V45.01) CEREBROVASCULAR DISEASE (ICD-437.9) PERIPHERAL VASCULAR DISEASE (ICD-443.9) HYPERCHOLESTEROLEMIA (ICD-272.0) OBESITY (ICD-278.00) DIVERTICULOSIS OF COLON (ICD-562.10) COLONIC POLYPS (ICD-211.3) HEMORRHOIDS (ICD-455.6) DEGENERATIVE JOINT DISEASE (ICD-715.90) LOW BACK PAIN, CHRONIC (ICD-724.2) ANXIETY (ICD-300.00)  Past Surgical History: S/P PTA to right common femoral artery by drGupta in 2001 S/P bilat carotid endarterectomies in 2002 by DrLawson S/P CABG in 2005 S/P pacemaker for CHB 7/09 by DrBerry  Family History: Reviewed history from 07/01/2009  and no changes required. asthma - paternal uncle heart disease - PGM, mother, father had MI rheumatism - father, paternal uncle cancer - mother (pancreatic) hypercholesterolemia - mother  Social History: Reviewed history from 07/01/2009 and no changes required. Retired - worked for medical supply Former Smoker---quit in 1998---smoked 1 1/2 ppd no alcohol drinks 1 cup of caffeine married no children  Review of Systems      See HPI       The patient complains of decreased hearing, dyspnea on exertion, and difficulty walking.  The patient denies anorexia, fever, weight loss, weight gain, vision loss, hoarseness, chest pain, syncope, peripheral edema, prolonged cough, headaches, hemoptysis, abdominal pain, melena, hematochezia, severe indigestion/heartburn, hematuria, incontinence, muscle weakness, suspicious skin lesions, transient blindness, depression, unusual weight change, abnormal bleeding, enlarged lymph nodes, and angioedema.    Vital Signs:  Patient profile:   77 year old male Height:      69 inches Weight:      252 pounds BMI:     37.35 O2 Sat:      96 % on Room air Temp:  98.1 degrees F oral Pulse rate:   81 / minute BP sitting:   150 / 80  (left arm) Cuff size:   regular  Vitals Entered By: Randell Loop CMA (April 13, 2010 10:16 AM)  O2 Sat at Rest %:  96 O2 Flow:  Room air CC: 6 month ROV & review of mult medical problems... Is Patient Diabetic? No Pain Assessment Patient in pain? no      Comments meds updated today with pt   Physical Exam  Additional Exam:  WD, Obese, 77 y/o WM in NAD... GENERAL:  Alert & oriented; pleasant & cooperative... HEENT:  [place]/AT, EOM-full, PERRLA, TMs-wnl, NOSE-clear, THROAT- sl red w/o exud... NECK:  Supple w/ fair ROM; no JVD; s/p bilat CAE's w/ bruits; no thyromegaly or nodules palpated; no lymphadenopathy. CHEST:  decr BS bilat w/ few scat rhonchi & wheezes, no rales, no signs of consolidation... HEART:  Regular Rhythm;  without murmurs/ rubs/ or gallops- median sternotomy scar...pacemaker in place ABDOMEN:  Obese, soft & nontender; normal bowel sounds; no organomegaly or masses detected. EXT: warm and dry, mild arthritic changes; no varicose veins/ +venous insuffic/ no edema. NEURO:  CN's intact; no focal neuro deficits...  DERM: no lesions seen, no rash etc...    Impression & Recommendations:  Problem # 1:  BRONCHITIS, CHRONIC, ACUTE EXACERBATION (ICD-491.21) COPD, AB>  improved if he says on his meds regularly- Symbicort, Spiriva, Singulaire, Mucinex...  Problem # 2:  HYPERTENSION (ICD-401.9) Controleed>  same meds, must get weight down!!! His updated medication list for this problem includes:    Metoprolol Tartrate 50 Mg Tabs (Metoprolol tartrate) .[name]... Take 1 tabs by mouth two times a day.    Cardizem Cd 180 Mg Xr24h-cap (Diltiazem hcl coated beads) .[name]... Take 1 capsule by mouth once a day    Cozaar 100 Mg Tabs (Losartan potassium) .[name]... Take 1 tablet by mouth once a day    Hydrochlorothiazide 12.5 Mg Tabs (Hydrochlorothiazide) .[name]... Take 1 tablet by mouth once a day  Problem # 3:  ARTERIOSCLEROTIC HEART DISEASE (ICD-414.00) Followed by [facility]>  HBP, ASHD, LVDysfunction, Hx AFib, Pacer... continue their Rx including Coumadin. His updated medication list for this problem includes:    Adult Aspirin Low Strength 81 Mg Tbdp (Aspirin) .[name]... Take 1 tablet by mouth once a day    Metoprolol Tartrate 50 Mg Tabs (Metoprolol tartrate) .[name]... Take 1 tabs by mouth two times a day.    Cardizem Cd 180 Mg Xr24h-cap (Diltiazem hcl coated beads) .[name]... Take 1 capsule by mouth once a day    Cozaar 100 Mg Tabs (Losartan potassium) .[name]... Take 1 tablet by mouth once a day    Hydrochlorothiazide 12.5 Mg Tabs (Hydrochlorothiazide) .[name]... Take 1 tablet by mouth once a day  Problem # 4:  CEREBROVASCULAR DISEASE (ICD-437.9) He is a vasculopath>  cerebrovasc & periph vasc dis... on COUMADIN per [facility].  Problem # 5:   HYPERCHOLESTEROLEMIA (ICD-272.0) Stable-  same Rx. His updated medication list for this problem includes:    Simvastatin 80 Mg Tabs (Simvastatin) .[name]... Take 1 tab by mouth at bedtime  Problem # 6:  OBESITY (ICD-278.00) Weight reduction is critically important but he doesn't see it> discussed w/ pt & advised diet + exercise.  Problem # 7:  DEGENERATIVE JOINT DISEASE (ICD-715.90) Vicodin refilled per request... His updated medication list for this problem includes:    Adult Aspirin Low Strength 81 Mg Tbdp (Aspirin) .[name]... Take 1 tablet by mouth once a day    Vicodin 5-500  Mg Tabs (Hydrocodone-acetaminophen) .[name]... Take 1 tab by mouth every 8-12 h as needed for pain... not to exceed 3 per day.  Problem # 8:  ANXIETY (ICD-300.00) Alpraz refilled per request... His updated medication list for this problem includes:    Xanax 0.5 Mg Tabs (Alprazolam) .[name]... Take 1 tablet by mouth three times a day as needed for nerves.... not to exceed 3 per day.  Complete Medication List: 1)  Fluticasone Propionate 50 Mcg/act Susp (Fluticasone propionate) .[name].. 1 spray each nostril two times a day 2)  Symbicort 160-4.5 Mcg/act Aero (Budesonide-formoterol fumarate) .... 2 sprays two times a day... 3)  Spiriva Handihaler 18 Mcg Caps (Tiotropium bromide monohydrate) .... Inhale the contents of one capsule via handihaler daily.Marland KitchenMarland Kitchen 4)  Singulair 10 Mg Tabs (Montelukast sodium) .... Take 1 tab by mouth once daily as directed... 5)  Mucinex 600 Mg Tb12 (Guaifenesin) .... Take 1-2 tabs by mouth two times a day w/ fluids for thick phlegm... 6)  Promethazine-codeine 6.25-10 Mg/64ml Syrp (Promethazine-codeine) .[name].. 1-2 tsp every 4-6 hr as needed cough 7)  Adult Aspirin Low Strength 81 Mg Tbdp (Aspirin) .... Take 1 tablet by mouth once a day 8)  Warfarin Sodium 5 Mg Tabs (Warfarin sodium) .... As directed by drberry 9)  Metoprolol Tartrate 50 Mg Tabs (Metoprolol tartrate) .... Take 1 tabs by mouth two times a day. 10)  Cardizem  Cd 180 Mg Xr24h-cap (Diltiazem hcl coated beads) .... Take 1 capsule by mouth once a day 11)  Cozaar 100 Mg Tabs (Losartan potassium) .... Take 1 tablet by mouth once a day 12)  Hydrochlorothiazide 12.5 Mg Tabs (Hydrochlorothiazide) .... Take 1 tablet by mouth once a day 13)  Simvastatin 80 Mg Tabs (Simvastatin) .... Take 1 tab by mouth at bedtime 14)  Fish Oil Concentrate 1000 Mg Caps (Omega-3 fatty acids) .... Take 1 tab by mouth at bedtime 15)  Prilosec Otc 20 Mg Tbec (Omeprazole magnesium) .... Take 1 tablet by mouth once a day 16)  Miralax Powd (Polyethylene glycol 3350) .... Once daily 17)  Colace 100 Mg Caps (Docusate sodium) .... Take 2 tablets by mouth once daily 18)  Vicodin 5-500 Mg Tabs (Hydrocodone-acetaminophen) .... Take 1 tab by mouth every 8-12 h as needed for pain... not to exceed 3 per day. 19)  Xanax 0.5 Mg Tabs (Alprazolam) .... Take 1 tablet by mouth three times a day as needed for nerves.... not to exceed 3 per day. 20)  Meclizine Hcl 25 Mg Tabs (Meclizine hcl) .... 1/2 to 1 tab by mouth every 6h as needed for dizziness... 21)  Proctosol Hc 2.5 % Crea (Hydrocortisone) .... Apply as directed... 22)  Coq10 100 Mg Caps (Coenzyme q10) .... Take 1 tablet by mouth once a day  Patient Instructions: 1)  Today we updated your med list- see below.... 2)  Continue your current medications the same... 3)  Today we refilled your Vicodin & Xanax per request... 4)  Keep up the good work w/ diet + exercise... 5)  Call for any questions.Marland KitchenMarland Kitchen 6)  Please schedule a follow-up appointment in 6 months, & we will plan FASTING blood work at that time... Prescriptions: XANAX 0.5 MG  TABS (ALPRAZOLAM) Take 1 tablet by mouth three times a day as needed for nerves.... not to exceed 3 per day.  #270 x 1   Entered and Authorized by:   Michele Mcalpine MD   Signed by:   Michele Mcalpine MD on 04/13/2010   Method used:   Print  then Give to Patient   RxID:   [id number] VICODIN 5-500 MG TABS  (HYDROCODONE-ACETAMINOPHEN) take 1 tab by mouth every 8-12 H as needed for pain... not to exceed 3 per day.  #270 x 1   Entered and Authorized by:   Michele Mcalpine MD   Signed by:   Michele Mcalpine MD on 04/13/2010   Method used:   Print then Give to Patient   RxID:   [id number]

## 2010-11-17 NOTE — Consult Note (Signed)
Summary: [facility]  [facility]   Imported By: Sherian Rein 02/17/2010 13:19:13  _____________________________________________________________________  External Attachment:    Type:   Image     Comment:   External Document

## 2010-11-17 NOTE — Letter (Signed)
Summary: Pre-Op Clearance/[facility]  Pre-Op Clearance/[facility]   Imported By: Sherian Rein 08/25/2010 09:21:57  _____________________________________________________________________  External Attachment:    Type:   Image     Comment:   External Document

## 2010-11-17 NOTE — Progress Notes (Signed)
Summary: handicapp sticker  Phone Note Call from Patient Call back at [phone]   Caller: Spouse Call For: Laberta Wilbon Summary of Call: pt purchased another car need note to get handicapp sticker. will pick up Initial call taken by: Rickard Patience,  Feb 24, 2010 10:25 AM  Follow-up for Phone Call        called and spoke with pt's spouse.  spouse states pt recently got a new car and they didn't want to trade the tags on the old car and therefore got new tags and now need a new handicap form filled out.  Will forward message to SN to address.  Aundra Millet Reynolds LPN  Feb 24, 2010 10:40 AM   Additional Follow-up for Phone Call Additional follow up Details #1::        called and spoke with pts wife and she will stop by this afternoon and pick this up for the pt.  left placard up front for her. Randell Loop CMA  Feb 24, 2010 3:06 PM

## 2010-11-19 NOTE — Assessment & Plan Note (Signed)
Summary: 6 months w/labs/apc   CC:  6 month ROV & review of mult medical problems....  History of Present Illness: 77 y/o WM here for a follow up visit... he has mult med problems as noted below...  Followed for COPD/ AB/ ex-smoker, HBP/ ASHD/ AFib/ Pacer- followed by DrSolomon/ [facility], cerebrovasc & peripheral vasc disease, Hyperlipidemia/ Obesity, DJD/ LBP, Anxiety, etc...   ~  October 14, 2009:  here for 68mo ROV w/ another exac- cough, congestion, sore throat, brown sputum, & incr SOB w/ wheezing etc... doesn't want Avelox due to cost- discussed trying Depo80/ Medrol dosepak, Doxycycline + his Symbicort/ Spiriva/ Singulair/ Mucinex regularly w/ fluids etc... he has gained to 252# & MUST diet, incr exercise & get weight down if he wants to survive...    ~  April 13, 2010:  no luck w/ weight reduction but then again he is not really on diet or exercising & we discussed this... he saw DrSolomon [facility] 4/11- pacer function normally & 2DEcho showed mod conc LVH, mod reduced LVF w/ EF= 35-40% w/ apical AK, dil RA... he is c/o a skin lesion ?SK? on vertex & I advised excision by Derm (he wants to call on his own to set this up)... needs refill Vicodin & Xanax...   ~  October 14, 2010:  68mo ROV- c/o recent dental issues, otherw Ok  w/ f/u DrSolomon 10/11- he had CDoppler & Nuclear Stress Test which were OK according to the pt (we don't have these reports)... weight remains 252# (no change);  BP controlled on meds;  denies CP, palpit edema;  remains in AFib on Coumadin per [facility];  he is on Simva80- & they want him to continue this med & this dose... meds refilled per request.    Current Problem List:  BRONCHITIS, CHRONIC, ACUTE EXACERBATION (ICD-491.21) >> see prev notes... COPD (ICD-496) - ex-smoker, freq flairs when off Rx... now taking: SYMBICORT 160- 2spBid,  SPIRIVA daily, SINGULAIR 10mg /d,  MUCINEX 1-2 Bid w/ fluids, + FLONASE...  ~  baseline CXR shows COPD, prev median sternotomy, NAD.Marland KitchenMarland Kitchen   ~   CT  CHEST 4/09 w/ extensive coronary calcif, prior CABG, no signif abn in the lungs.  ~  PFT's 4/09 showed FVC=2.88 (71%), FEV1=2.13 (67%), FEV1/ FVC= 74%, mid-flows= 51%pred...  ~  CXR 3/10 showed sl cardiomeg, pleural thickening, pacer, DJD sp, NAD.  ~  CXR 121// showed s/p CABG, pacer on left, borderline cardiomeg, basilar scarring, NAD.  OBSTRUCTIVE SLEEP APNEA (ICD-327.23) - he does not tolerate his CPAP...  HYPERTENSION (ICD-401.9) controlled on meds:  METOPROLOL 50mg Bid, COZAAR 100/d, CARDIZEM CD 180mg /d, & HCTZ 12.5mg /d... BP today = 140/72 and not really checking BP at home as requested... advised to elim sodium, get on deit & get weight down!!!  ARTERIOSCLEROTIC HEART DISEASE (ICD-414.00) - follow by [facility]... he is s/p CABG 1/05... he is on ASA 81mg /d,  now COUMADIN, + meds as listed above... note 2DEcho w/ LVD & EF= 35-40%...  ~   CT CHEST 4/09 w/ extensive coronary calcif, prior CABG, no signif abn in the lungs.  ~  cath 4/09 showed patent grafts, some disease in Circ & RCA, patent stent to left renal art... med Rx.   ~  2DEcho 5/11 showed mod conc LVH, mod reduced LVF w/ EF= 35-40% w/ apical AK, dil RA...  ~  pt reports Nuclear Stress Test after 10/11 OV w/ DrSolomon was OK.  ATRIAL FIBRILLATION (ICD-427.31) CARDIAC PACEMAKER IN SITU (ICD-V45.01) - he had syncope w/ 4 fx  right ribs and CHB & AFib requiring pacer & Coumadin started... [facility] + DrBerry... f/u pacer checks OK & no changes made...  CEREBROVASCULAR DISEASE (ICD-437.9) - S/P Bilat CAE's 2000 & doppler's are followed by DrBerry...   ~  last CDoppler reported by [facility] 4/09 showed mod distal right CCA stenosis.  ~  pt reports CDopplers done after 10/11 OV w/ DrSolomon was OK.  PERIPHERAL VASCULAR DISEASE (ICD-443.9) - S/P Rt fem art thrombectomy 2000, and Rt renal art stent 2002... he has all this followed by [facility] now & last doppler & cath showed patent stent.  HYPERCHOLESTEROLEMIA (ICD-272.0) - controlled on  SIMVASTATIN 80mg /d & Fish Oil and labs followed by DrBerry (pt will ask for copies to be sent to [place])... he recently added NIASPAN for HDL of 31, but this was stopped due to flushing/ intol...   ~  FLP 12/09 showed TChol 138, TG 220, HDL 34, LDL 60  ~  FLP 12/10 showed TChol 132, TG 253, HDL 33, LDL 61  ~  FLP 12/11 showed TChol 144, TG 200, HDL 33, LDL 71... advised low fat diet & get weight down  OBESITY (ICD-278.00) - weight fluctuates betw 240-250# & he has been unable to lose weight... he is sedentary and we discussed an exercise program for him, since he is limited by LBP.  ~  NOTE:  labs 12/09 showed BS= 131, HgA1c= 6.1.[name]. advised low carb diet, get wt down!  ~  6/10: wt today= 249#, but pt states down to 233# at home- "just decr portions"  ~  12/10:  wt today= 252# post holiday... he states "I'm starving myself to death"...  ~  April 02, 2023:  weight = 252#.[name]. states he's lost 4" at the waist...  ~  12/11:  weight = 252#  DIVERTICULOSIS OF COLON (ICD-562.10) COLONIC POLYPS (ICD-211.3) HEMORRHOIDS (ICD-455.6)  ~  last colonoscopy was 12/07 by DrStark and showed divertic, polyps 2-18mm size (ademomas), and hems... f/u planned 3yrs.  DEGENERATIVE JOINT DISEASE (ICD-715.90) LOW BACK PAIN, CHRONIC (ICD-724.2)  ~  he uses DCN 100 for pain and prev requested refill of #360 for 3 month supply...  ~  12/10: DCN now off the market & Rx for Vicodin #270- up to 3/d as needed for pain.  ANXIETY (ICD-300.00) - he uses ALPRAZOLAM 0.5mg  Tid and prev requested #270 for 3 month supply...   Preventive Screening-Counseling & Management  Alcohol-Tobacco     Smoking Status: quit     Packs/Day: 2ppd     Year Started: 1946     Year Quit: 1998     Pack years: 33yrs, 1-3ppd  Allergies: 1)  ! Pcn  Comments:  Nurse/Medical Assistant: The patient's medications and allergies were reviewed with the patient and were updated in the Medication and Allergy Lists.  Past History:  Past Medical  History: OBSTRUCTIVE SLEEP APNEA (ICD-327.23) Hx of BRONCHIAL PNEUMONIA (ICD-485) BRONCHITIS, CHRONIC, ACUTE EXACERBATION (ICD-491.21) COPD (ICD-496) HYPERTENSION (ICD-401.9) ARTERIOSCLEROTIC HEART DISEASE (ICD-414.00) ATRIAL FIBRILLATION (ICD-427.31) CARDIAC PACEMAKER IN SITU (ICD-V45.01) CEREBROVASCULAR DISEASE (ICD-437.9) PERIPHERAL VASCULAR DISEASE (ICD-443.9) HYPERCHOLESTEROLEMIA (ICD-272.0) OBESITY (ICD-278.00) DIVERTICULOSIS OF COLON (ICD-562.10) COLONIC POLYPS (ICD-211.3) HEMORRHOIDS (ICD-455.6) DEGENERATIVE JOINT DISEASE (ICD-715.90) LOW BACK PAIN, CHRONIC (ICD-724.2) ANXIETY (ICD-300.00)  Past Surgical History: S/P PTA to right common femoral artery by drGupta in 2001 S/P bilat carotid endarterectomies in 2002 by DrLawson S/P CABG in 2005 S/P pacemaker for CHB 7/09 by DrBerry  Family History: Reviewed history from 04/13/2010 and no changes required. asthma - paternal uncle heart disease -  PGM, mother, father had MI rheumatism - father, paternal uncle cancer - mother (pancreatic) hypercholesterolemia - mother  Social History: Reviewed history from 04/13/2010 and no changes required. Retired - worked for medical supply Former Smoker---quit in 1998---smoked 1 1/2 ppd no alcohol drinks 1 cup of caffeine married no children  Review of Systems      See HPI       The patient complains of dyspnea on exertion.  The patient denies anorexia, fever, weight loss, weight gain, vision loss, decreased hearing, hoarseness, chest pain, syncope, peripheral edema, prolonged cough, headaches, hemoptysis, abdominal pain, melena, hematochezia, severe indigestion/heartburn, hematuria, incontinence, muscle weakness, suspicious skin lesions, transient blindness, difficulty walking, depression, unusual weight change, abnormal bleeding, enlarged lymph nodes, and angioedema.    Vital Signs:  Patient profile:   77 year old male Height:      69 inches Weight:      252 pounds O2 Sat:       96 % on Room air Temp:     96.8 degrees F oral Pulse rate:   53 / minute BP sitting:   140 / 72  (left arm) Cuff size:   regular  Vitals Entered By: Randell Loop CMA (October 14, 2010 9:19 AM)  O2 Sat at Rest %:  96 O2 Flow:  Room air CC: 6 month ROV & review of mult medical problems... Is Patient Diabetic? No Pain Assessment Patient in pain? no      Comments meds  updated today with pt   Physical Exam  Additional Exam:  WD, Obese, 77 y/o WM in NAD... GENERAL:  Alert & oriented; pleasant & cooperative... HEENT:  [place]/AT, EOM-full, PERRLA, TMs-wnl, NOSE-clear, THROAT- sl red w/o exud... NECK:  Supple w/ fair ROM; no JVD; s/p bilat CAE's w/ bruits; no thyromegaly or nodules palpated; no lymphadenopathy. CHEST:  decr BS bilat w/ few scat rhonchi & wheezes, no rales, no signs of consolidation... HEART:  Regular Rhythm; without murmurs/ rubs/ or gallops- median sternotomy scar...pacemaker in place ABDOMEN:  Obese, soft & nontender; normal bowel sounds; no organomegaly or masses detected. EXT: warm and dry, mild arthritic changes; no varicose veins/ +venous insuffic/ no edema. NEURO:  CN's intact; no focal neuro deficits...  DERM: no lesions seen, no rash etc...    MISC. Report  Procedure date:  10/14/2010  Findings:      Data Reviewed >> reported to pt via Phone Tree.  ~  CXR  ~  Fasting labs   Impression & Recommendations:  Problem # 1:  COPD (ICD-496) Stable w/o acute exac > continue current meds. His updated medication list for this problem includes:    Symbicort 160-4.5 Mcg/act Aero (Budesonide-formoterol fumarate) .[name]... 2 sprays two times a day...    Spiriva Handihaler 18 Mcg Caps (Tiotropium bromide monohydrate) ..... Inhale the contents of one capsule via handihaler daily...    Singulair 10 Mg Tabs (Montelukast sodium) .[name]... Take 1 tab by mouth once daily as directed...  Orders: T-2 View CXR (71020TC)  Problem # 2:  HYPERTENSION (ICD-401.9) BP controlled>   Same meds. His updated medication list for this problem includes:    Metoprolol Tartrate 50 Mg Tabs (Metoprolol tartrate) .[name]... Take 1 tabs by mouth two times a day.    Cardizem Cd 180 Mg Xr24h-cap (Diltiazem hcl coated beads) .[name]... Take 1 capsule by mouth once a day    Cozaar 100 Mg Tabs (Losartan potassium) .[name]... Take 1 tablet by mouth once a day    Hydrochlorothiazide 12.5 Mg Tabs (  Hydrochlorothiazide) .[name]... Take 1 tablet by mouth once a day  Orders: T-2 View CXR (71020TC) TLB-BMP (Basic Metabolic Panel-BMET) (80048-METABOL) TLB-Hepatic/Liver Function Pnl (80076-HEPATIC) TLB-CBC Platelet - w/Differential (85025-CBCD) TLB-Lipid Panel (80061-LIPID) TLB-TSH (Thyroid Stimulating Hormone) (84443-TSH) TLB-PSA (Prostate Specific Antigen) (84153-PSA)  Problem # 3:  ARTERIOSCLEROTIC HEART DISEASE (ICD-414.00) Followed by [facility]- DrSolomon et al... continue their Rx. His updated medication list for this problem includes:    Adult Aspirin Low Strength 81 Mg Tbdp (Aspirin) .[name]... Take 1 tablet by mouth once a day    Metoprolol Tartrate 50 Mg Tabs (Metoprolol tartrate) .[name]... Take 1 tabs by mouth two times a day.    Cardizem Cd 180 Mg Xr24h-cap (Diltiazem hcl coated beads) .[name]... Take 1 capsule by mouth once a day    Cozaar 100 Mg Tabs (Losartan potassium) .[name]... Take 1 tablet by mouth once a day    Hydrochlorothiazide 12.5 Mg Tabs (Hydrochlorothiazide) .[name]... Take 1 tablet by mouth once a day  Problem # 4:  ATRIAL FIBRILLATION (ICD-427.31) Coumadin per [facility]... His updated medication list for this problem includes:    Adult Aspirin Low Strength 81 Mg Tbdp (Aspirin) .[name]... Take 1 tablet by mouth once a day    Warfarin Sodium 5 Mg Tabs (Warfarin sodium) .[name]... As directed by drberry    Metoprolol Tartrate 50 Mg Tabs (Metoprolol tartrate) .[name]... Take 1 tabs by mouth two times a day.    Cardizem Cd 180 Mg Xr24h-cap (Diltiazem hcl coated beads) .[name]... Take 1 capsule by mouth once a day  Problem # 5:  CARDIAC  PACEMAKER IN SITU (ICD-V45.01) Pacer checks per [facility]...  Problem # 6:  CEREBROVASCULAR DISEASE (ICD-437.9) CDopplers per [facility]> pt states OK, we don't have copies to review.  Problem # 7:  HYPERCHOLESTEROLEMIA (ICD-272.0) Chol looks OK, still w/ incr TG & low HDL>  advised low fat diet, get weight down, incr exerc. His updated medication list for this problem includes:    Simvastatin 80 Mg Tabs (Simvastatin) .[name]... Take 1 tab by mouth at bedtime  Problem # 8:  OTHER MEDICAL ISSUES AS NOTED>>>  Complete Medication List: 1)  Fluticasone Propionate 50 Mcg/act Susp (Fluticasone propionate) .[name].. 1 spray each nostril two times a day 2)  Symbicort 160-4.5 Mcg/act Aero (Budesonide-formoterol fumarate) .... 2 sprays two times a day... 3)  Spiriva Handihaler 18 Mcg Caps (Tiotropium bromide monohydrate) .... Inhale the contents of one capsule via handihaler daily.Marland KitchenMarland Kitchen 4)  Singulair 10 Mg Tabs (Montelukast sodium) .... Take 1 tab by mouth once daily as directed... 5)  Mucinex 600 Mg Tb12 (Guaifenesin) .... Take 1-2 tabs by mouth two times a day w/ fluids for thick phlegm... 6)  Adult Aspirin Low Strength 81 Mg Tbdp (Aspirin) .... Take 1 tablet by mouth once a day 7)  Warfarin Sodium 5 Mg Tabs (Warfarin sodium) .... As directed by drberry 8)  Metoprolol Tartrate 50 Mg Tabs (Metoprolol tartrate) .... Take 1 tabs by mouth two times a day. 9)  Cardizem Cd 180 Mg Xr24h-cap (Diltiazem hcl coated beads) .... Take 1 capsule by mouth once a day 10)  Cozaar 100 Mg Tabs (Losartan potassium) .... Take 1 tablet by mouth once a day 11)  Hydrochlorothiazide 12.5 Mg Tabs (Hydrochlorothiazide) .... Take 1 tablet by mouth once a day 12)  Simvastatin 80 Mg Tabs (Simvastatin) .... Take 1 tab by mouth at bedtime 13)  Fish Oil Concentrate 1000 Mg Caps (Omega-3 fatty acids) .... Take 1 tab by mouth at bedtime 14)  Coq10 100 Mg Caps (Coenzyme q10) .Marland KitchenMarland KitchenMarland Kitchen  Take 1 tablet by mouth once a day 15)  Prilosec Otc 20 Mg Tbec (Omeprazole  magnesium) .... Take 1 tablet by mouth once a day 16)  Miralax Powd (Polyethylene glycol 3350) .... Once daily 17)  Colace 100 Mg Caps (Docusate sodium) .... Take 2 tablets by mouth once daily 18)  Proctosol Hc 2.5 % Crea (Hydrocortisone) .... Apply as directed... 19)  Vicodin 5-500 Mg Tabs (Hydrocodone-acetaminophen) .... Take 1 tab by mouth every 8-12 h as needed for pain... not to exceed 3 per day. 20)  Xanax 0.5 Mg Tabs (Alprazolam) .... Take 1 tablet by mouth three times a day as needed for nerves.... not to exceed 3 per day. 21)  Meclizine Hcl 25 Mg Tabs (Meclizine hcl) .... 1/2 to 1 tab by mouth every 6h as needed for dizziness...  Patient Instructions: 1)  Today we updated your med list- see below.... 2)  We refilled the meds you requested... 3)  Today we did your f/u CXR & FASTING blood work... please call the "phone tree" in a few days for your lab results.Marland KitchenMarland Kitchen 4)  Let's get on track w/ our diet + exercise program> the goal is to lose 15-20 lbs over the next few months... 5)  Call for any problems.Marland KitchenMarland Kitchen 6)  Please schedule a follow-up appointment in 6 months. Prescriptions: MECLIZINE HCL 25 MG  TABS (MECLIZINE HCL) 1/2 to 1 tab by mouth every 6H as needed for dizziness...  #100 x 6   Entered and Authorized by:   Michele Mcalpine MD   Signed by:   Michele Mcalpine MD on 10/14/2010   Method used:   Print then Give to Patient   RxID:   [id number] XANAX 0.5 MG  TABS (ALPRAZOLAM) Take 1 tablet by mouth three times a day as needed for nerves.... not to exceed 3 per day.  #270 x 1   Entered and Authorized by:   Michele Mcalpine MD   Signed by:   Michele Mcalpine MD on 10/14/2010   Method used:   Print then Give to Patient   RxID:   [id number] VICODIN 5-500 MG TABS (HYDROCODONE-ACETAMINOPHEN) take 1 tab by mouth every 8-12 H as needed for pain... not to exceed 3 per day.  #270 x 1   Entered and Authorized by:   Michele Mcalpine MD   Signed by:   Michele Mcalpine MD on 10/14/2010   Method used:    Print then Give to Patient   RxID:   [id number] SINGULAIR 10 MG  TABS (MONTELUKAST SODIUM) take 1 tab by mouth once daily as directed...  #90 x 4   Entered and Authorized by:   Michele Mcalpine MD   Signed by:   Michele Mcalpine MD on 10/14/2010   Method used:   Print then Give to Patient   RxID:   [id number] SPIRIVA HANDIHALER 18 MCG CAPS (TIOTROPIUM BROMIDE MONOHYDRATE) inhale the contents of one capsule via Handihaler daily...  #90 x 4   Entered and Authorized by:   Michele Mcalpine MD   Signed by:   Michele Mcalpine MD on 10/14/2010   Method used:   Print then Give to Patient   RxID:   [id number] SYMBICORT 160-4.5 MCG/ACT  AERO (BUDESONIDE-FORMOTEROL FUMARATE) 2 sprays two times a day...  #3 x 4   Entered and Authorized by:   Michele Mcalpine MD   Signed by:   Michele Mcalpine MD on 10/14/2010   Method used:  Print then Give to Patient   RxID:   [id number] FLUTICASONE PROPIONATE 50 MCG/ACT SUSP (FLUTICASONE PROPIONATE) 1 spray each nostril two times a day  #3 x 4   Entered and Authorized by:   Michele Mcalpine MD   Signed by:   Michele Mcalpine MD on 10/14/2010   Method used:   Print then Give to Patient   RxID:   [id number]    Immunization History:  Influenza Immunization History:    Influenza:  historical (07/27/2010)

## 2010-11-19 NOTE — Letter (Signed)
Summary: [facility]  [facility]   Imported By: Lester [place] 10/15/2010 09:15:38  _____________________________________________________________________  External Attachment:    Type:   Image     Comment:   External Document

## 2010-11-20 NOTE — Consult Note (Signed)
Summary: [facility]  [facility]   Imported By: Sherian Rein 08/21/2010 11:10:56  _____________________________________________________________________  External Attachment:    Type:   Image     Comment:   External Document

## 2010-12-08 ENCOUNTER — Telehealth (INDEPENDENT_AMBULATORY_CARE_PROVIDER_SITE_OTHER): Payer: Self-pay | Admitting: *Deleted

## 2010-12-15 NOTE — Progress Notes (Signed)
Summary: sick-LMOMTCB x 1  Phone Note Call from Patient Call back at [phone] wife cell-until 4:00   Caller: Spouse--shirley Call For: nadel Reason for Call: Acute Illness, Talk to Nurse Summary of Call: Wife calling for husband c/o nonproductive cough and congestion since Thursday last week.  Patient started using mucinex with little help.  Requesting appt or rx.  [facility]   after 4:00 use home number [phone] Initial call taken by: Lehman Prom,  December 08, 2010 2:22 PM  Follow-up for Phone Call        Spoke with pt's spouse.  She states that pt has been c/o dry cough and chest congestion x 2 days.  Feels very weak and tired.  No fever, SOB, wheeze.  Taking mucinex DM without much relief. Pls advise thanks allergic to PCN.  Follow-up by: Vernie Murders,  December 08, 2010 3:08 PM  Additional Follow-up for Phone Call Additional follow up Details #1::        per SN---ok for pt to have the avelox 400mg    #7  1 daily and pred dosepak #1  take as directed with no refills.  5mg    6 day pack, cont the mucinex 2 by mouth two times a day with plenty of fluids.  thanks Randell Loop CMA  December 08, 2010 4:44 PM     Additional Follow-up for Phone Call Additional follow up Details #2::    LMOMTCB  Vernie Murders  December 08, 2010 4:56 PM    New/Updated Medications: AVELOX 400 MG TABS (MOXIFLOXACIN HCL) 1 by mouth once daily until gone PREDNISONE (PAK) 5 MG TABS (PREDNISONE) 6 day pack, take as directed Prescriptions: PREDNISONE (PAK) 5 MG TABS (PREDNISONE) 6 day pack, take as directed  #1 x 0   Entered by:   Vernie Murders   Authorized by:   Michele Mcalpine MD   Signed by:   Vernie Murders on 12/08/2010   Method used:   [facility]ronically to        [facility] [address] [phone]* (retail)       [address]       [place], [place]  [address]       Ph: [phone] or [phone]       Fax: [phone]   RxID:   [id number] AVELOX 400 MG TABS  (MOXIFLOXACIN HCL) 1 by mouth once daily until gone  #7 x 0   Entered by:   Vernie Murders   Authorized by:   Michele Mcalpine MD   Signed by:   Vernie Murders on 12/08/2010   Method used:   Electronically to        [facility] [address] [phone]* (retail)       [address]       [place], [place]  [address]       Ph: [phone] or [phone]       Fax: [phone]   RxID:   [id number]

## 2010-12-18 ENCOUNTER — Telehealth (INDEPENDENT_AMBULATORY_CARE_PROVIDER_SITE_OTHER): Payer: Self-pay | Admitting: *Deleted

## 2010-12-21 ENCOUNTER — Telehealth (INDEPENDENT_AMBULATORY_CARE_PROVIDER_SITE_OTHER): Payer: Self-pay | Admitting: *Deleted

## 2010-12-24 ENCOUNTER — Ambulatory Visit: Payer: Self-pay | Admitting: Adult Health

## 2010-12-24 NOTE — Progress Notes (Signed)
Summary: Refill Avelox and Prednisone  Phone Note Call from Patient Call back at [phone]   Caller: Spouse Shirley Call For: nadel Summary of Call: Patient had COPD episode and has finished taking Avelox and Prednisone.  Only slight improvement and running low-grade fever.  Taking Mucinex DM still.  Patient's wife wants refill on Avelox and Prednisone.  [facility] [address] Initial call taken by: Leonette Monarch,  December 18, 2010 11:20 AM  Follow-up for Phone Call        Spoke with pt wife [name] and she staets the pt has had some inprovement but he is still having chest congestion, productive cough with yellow phlegm and a low grade temp.   Pt has finsihed Avelox and pred pak on 12-15-10. Pt spouse is requesting a refill on avelox and prednisone. Please advise. Carron Curie CMA  December 18, 2010 11:40 AM allergies: PCN  Additional Follow-up for Phone Call Additional follow up Details #1::        per SN---ok for pt to have avelox 400mg   #7  1 by mouth once daily and pred 20mg   #30  1 by mouth once daily as directed.  thanks Randell Loop CMA  December 18, 2010 12:31 PM      Additional Follow-up for Phone Call Additional follow up Details #2::    Rxs were sent to pharm.  Pt's spouse made aware this has been done.  Follow-up by: Vernie Murders,  December 18, 2010 1:40 PM  New/Updated Medications: PREDNISONE 20 MG TABS (PREDNISONE) 1 by mouth once daily as directed AVELOX 400 MG TABS (MOXIFLOXACIN HCL) 1 by mouth once daily until gone Prescriptions: AVELOX 400 MG TABS (MOXIFLOXACIN HCL) 1 by mouth once daily until gone  #7 x 0   Entered by:   Vernie Murders   Authorized by:   Michele Mcalpine MD   Signed by:   Vernie Murders on 12/18/2010   Method used:   Electronically to        [facility] [address] [phone]* (retail)       [address]       [place], [place]  [address]       Ph: [phone] or [phone]       Fax: [phone]   RxID:    [id number] PREDNISONE 20 MG TABS (PREDNISONE) 1 by mouth once daily as directed  #30 x 0   Entered by:   Vernie Murders   Authorized by:   Michele Mcalpine MD   Signed by:   Vernie Murders on 12/18/2010   Method used:   Electronically to        [facility]  [facility] [address] [phone]* (retail)       [address]       [place], [place]  [address]       Ph: [phone] or [phone]       Fax: [phone]   RxID:   [id number]

## 2010-12-29 NOTE — Progress Notes (Signed)
Summary: req to see dr Thomasena Edis  Phone Note Call from Patient   Caller: Spouse Dorita Fray Call For: nadel Summary of Call: spouse wants pt to see dr Thomasena Edis. offered appt w/ tp but she requests that pt see dr Kriste Basque for non-productive cough. pt still taking abx/ musinex. spouse # [phone] Initial call taken by: Tivis Ringer, CNA,  December 21, 2010 2:46 PM  Follow-up for Phone Call        Spoke with pt's spouse, [name].  She states that overall pt is improving, but she is worried that the cough will not completely go away before the end of the wk, so wanted to sched appt with SN for Friday "just in case".  I advised that SN has no opeings, but can see TP on Thurs 3/8.  Appt sched for 2:45 pm and she will call and cancel if appt no longer needed.  Follow-up by: Vernie Murders,  December 21, 2010 5:18 PM

## 2011-01-18 ENCOUNTER — Telehealth: Payer: Self-pay | Admitting: Pulmonary Disease

## 2011-01-18 DIAGNOSIS — J180 Bronchopneumonia, unspecified organism: Secondary | ICD-10-CM

## 2011-01-18 DIAGNOSIS — J449 Chronic obstructive pulmonary disease, unspecified: Secondary | ICD-10-CM

## 2011-01-18 NOTE — Telephone Encounter (Signed)
Per SN----pt needs ov with Sn this week.  Ok to add pt on for in the am at 9:00.  With cxr prior to ov please. thanks

## 2011-01-18 NOTE — Telephone Encounter (Signed)
lmomtcb x1 on home number

## 2011-01-18 NOTE — Telephone Encounter (Signed)
Spoke with pt wife and she states pt has been c/o chest congestion, dry cough, wheezing, some increased SOB at rest. Pt has already been through 2 rounds of avelox and pred taper. Pt also been taking mucinex dm twice a day and is on his 6th box. Wife thinks pt may have PNA. Wife would like pt to be worked in if possible. Please advise Dr. Kriste Basque. Thanks  Allergies  Allergen Reactions  . Penicillins     REACTION: Allergic to PCN w/ throat swelling \\T \ hives

## 2011-01-19 NOTE — Telephone Encounter (Signed)
Spoke with [name] (pt wife) and informed her pt needs to be seen this with w/ cxr prior. Pt is scheduled to come in 4/5 at 2:30 w/ cxr prior to OV. Pt verbalized understanding of the ov w/ cxr. Order already placed for cxr to be done

## 2011-01-19 NOTE — Telephone Encounter (Signed)
Wife calling back.  [phone]

## 2011-01-20 ENCOUNTER — Encounter: Payer: Self-pay | Admitting: Pulmonary Disease

## 2011-01-21 ENCOUNTER — Ambulatory Visit (INDEPENDENT_AMBULATORY_CARE_PROVIDER_SITE_OTHER): Payer: 59 | Admitting: Pulmonary Disease

## 2011-01-21 ENCOUNTER — Ambulatory Visit (INDEPENDENT_AMBULATORY_CARE_PROVIDER_SITE_OTHER)
Admission: RE | Admit: 2011-01-21 | Discharge: 2011-01-21 | Disposition: A | Discharge status: Home / Self Care | Payer: 59 | Source: Ambulatory Visit | Attending: Pulmonary Disease | Admitting: Pulmonary Disease

## 2011-01-21 ENCOUNTER — Encounter: Payer: Self-pay | Admitting: Pulmonary Disease

## 2011-01-21 VITALS — BP 140/84 | HR 50 | Temp 97.0°F | Ht 69.0 in | Wt 247.4 lb

## 2011-01-21 DIAGNOSIS — J180 Bronchopneumonia, unspecified organism: Secondary | ICD-10-CM

## 2011-01-21 DIAGNOSIS — J449 Chronic obstructive pulmonary disease, unspecified: Secondary | ICD-10-CM

## 2011-01-21 DIAGNOSIS — J4489 Other specified chronic obstructive pulmonary disease: Secondary | ICD-10-CM

## 2011-01-21 DIAGNOSIS — H9192 Unspecified hearing loss, left ear: Secondary | ICD-10-CM

## 2011-01-21 DIAGNOSIS — I2510 Atherosclerotic heart disease of native coronary artery without angina pectoris: Secondary | ICD-10-CM

## 2011-01-21 DIAGNOSIS — N139 Obstructive and reflux uropathy, unspecified: Secondary | ICD-10-CM

## 2011-01-21 DIAGNOSIS — J441 Chronic obstructive pulmonary disease with (acute) exacerbation: Secondary | ICD-10-CM

## 2011-01-21 DIAGNOSIS — H9190 Unspecified hearing loss, unspecified ear: Secondary | ICD-10-CM

## 2011-01-21 MED ORDER — METHYLPREDNISOLONE ACETATE 80 MG/ML IJ SUSP
80.0000 mg | Freq: Once | INTRAMUSCULAR | Status: AC
  Administered 2011-01-21: 80 mg via INTRA_ARTICULAR

## 2011-01-21 MED ORDER — PREDNISONE 20 MG PO TABS: 20.0000 mg | ORAL_TABLET | Freq: Every day | ORAL | Status: AC

## 2011-01-21 NOTE — Progress Notes (Signed)
Subjective:    Patient ID: Todd Kelly, male    DOB: Jan 23, 1931, 77 y.o.   MRN: [id number]  HPI 77 y/o WM here for a follow up visit... he has mult med problems including:  COPD/ AB/ ex-smoker;  HBP/ ASHD/ AFib/ Pacer- followed by DrSolomon/ [facility];  Cerebrovasc & peripheral vasc disease;  Hyperlipidemia/ Obesity;  DJD/ LBP;  Anxiety, etc...  ~  October 14, 2010:  36mo ROV- c/o recent dental issues, otherw OK w/ f/u DrSolomon 10/11- he had CDoppler & Nuclear Stress Test which were OK according to the pt (we don't have these reports)... weight remains 252# (no change);  BP controlled on meds;  denies CP, palpit edema;  remains in AFib on Coumadin per [facility];  he is on Simva80- & they want him to continue this med & this dose... meds refilled per request.  ~  January 21, 2011:  Add-on due to refractory AB episode> started 2/12 w/ cough, congestion, weak & tired- given Avelox & Dosepak w/ improvement;  Called again 3/12 w/ recurrent symptoms & wanted refill Rx, this time given Pred 20mg /d for longer course;  Presents now w/ same senario> symptoms returned off Rx & now w/ thick beige mucus, cough/ congestion, etc;  Exam is actually clear & CXR is clear (chr changes, no pneumonia);  Sputum is thick, beige & not purulent;  We discussed Rx w/ Mucinex 1200mg  Bid regularly & lots of fluids, plus continuing his regular Symbicort, Spiriva, Singulair, Flonase, and adding Pred 20mg  tapering sched but STAY on this til rov...      He is also c/o decr hearing in left ear> exam shows hard wax on the drum distally, he says he checked w/ ENT Jearld Fenton recently & told OK, I rec that he start using debrox to loosen wax & seek second opinion from DrCrosley for lavage & ?hearing eval...    He is also c/o BOO symptoms & we will arrange fopr Urology eval (he has seen DrPeterson in the past...   Past Medical History  Diagnosis Date  . OSA (obstructive sleep apnea)   . Bronchial pneumonia   . Bronchitis, chronic with acute  exacerbation   . COPD (chronic obstructive pulmonary disease)   . Hypertension   . Arteriosclerotic heart disease   . Atrial fibrillation   . Cardiac pacemaker in situ   . Cerebrovascular disease   . Peripheral vascular disease   . Hypercholesteremia   . Obesity   . Diverticulosis of colon   . Colon polyps   . Hemorrhoids   . DJD (degenerative joint disease)   . Chronic low back pain   . Anxiety     Past Surgical History  Procedure Date  . Pta to right common femoral artery 2001    Dr. [name]  . Carotid endarterectomy 2002    bilateral.  Dr. Hart Rochester  . Coronary artery bypass graft 2005  . Pacemaker placement July 2009    for CHB  Dr. Allyson Sabal.    Outpatient Encounter Prescriptions as of 01/21/2011  Medication Sig Dispense Refill  . ALPRAZolam (XANAX) 0.5 MG tablet Take 0.5 mg by mouth 3 (three) times daily as needed. Not to exceed 3 per day.       [name] aspirin 81 MG tablet Take 81 mg by mouth daily.        . budesonide-formoterol (SYMBICORT) 160-4.5 MCG/ACT inhaler Inhale 2 puffs into the lungs 2 (two) times daily.        . Coenzyme Q10 (COQ10) 100  MG CAPS Take 1 capsule by mouth daily.        [name] diltiazem (CARDIZEM CD) 180 MG 24 hr capsule Take 180 mg by mouth daily.        [name] docusate sodium (COLACE) 100 MG capsule Take 2 tabs by mouth once daily       . fluticasone (FLONASE) 50 MCG/ACT nasal spray 1 spray by Nasal route 2 (two) times daily.        [name] guaiFENesin (MUCINEX) 600 MG 12 hr tablet Take 1 to 2 tabs by mouth two times a day with fluids for thick phlegm       . hydrochlorothiazide (,MICROZIDE/HYDRODIURIL,) 12.5 MG capsule Take 12.5 mg by mouth daily.        [name] HYDROcodone-acetaminophen (VICODIN) 5-500 MG per tablet Take 1 tab by mouth every 8 to 12 hours as needed for pain. Not to exceed 3 per day.       . hydrocortisone (PROCTOSOL HC) 2.5 % rectal cream as directed.        [name] losartan (COZAAR) 100 MG tablet Take 100 mg by mouth daily.        . meclizine (ANTIVERT) 25 MG tablet  Take 1/2 to 1 tab by mouth every 6 hours as needed for dizziness.       . metoprolol (LOPRESSOR) 50 MG tablet Take 50 mg by mouth 2 (two) times daily.        . montelukast (SINGULAIR) 10 MG tablet Take 10 mg by mouth at bedtime.        . Omega-3 Fatty Acids (FISH OIL) 1000 MG CAPS Take 1 capsule by mouth daily.        [name] omeprazole (PRILOSEC OTC) 20 MG tablet Take 20 mg by mouth daily.        . polyethylene glycol (MIRALAX / GLYCOLAX) packet Take 17 g by mouth daily.        . simvastatin (ZOCOR) 80 MG tablet Take 80 mg by mouth at bedtime.        [name] tiotropium (SPIRIVA) 18 MCG inhalation capsule Place 18 mcg into inhaler and inhale daily.        [name] warfarin (COUMADIN) 5 MG tablet Take as directed by Dr. Allyson Sabal       . moxifloxacin (AVELOX) 400 MG tablet Take 400 mg by mouth daily. Until gone.       . predniSONE (DELTASONE) 20 MG tablet Take as directed         Allergies  Allergen Reactions  . Penicillins     REACTION: Allergic to PCN w/ throat swelling \\T \ hives    Review of Systems        See HPI - all other systems neg except as noted...       The patient complains of dyspnea on exertion.  The patient denies anorexia, fever, weight loss, weight gain, vision loss, decreased hearing, hoarseness, chest pain, syncope, peripheral edema, prolonged cough, headaches, hemoptysis, abdominal pain, melena, hematochezia, severe indigestion/heartburn, hematuria, incontinence, muscle weakness, suspicious skin lesions, transient blindness, difficulty walking, depression, unusual weight change, abnormal bleeding, enlarged lymph nodes, and angioedema.     Objective:   Physical Exam     WD, Obese, 77 y/o WM in NAD... GENERAL:  Alert & oriented; pleasant & cooperative... HEENT:  [place]/AT, EOM-full, PERRLA, TMs- distal wax blocking left TM, NOSE-clear, THROAT- sl red w/o exud... NECK:  Supple w/ fair ROM; no JVD; s/p bilat CAE's w/ bruits; no thyromegaly or nodules palpated; no lymphadenopathy. CHEST:  decr  BS bilat w/ few scat rhonchi, no rales, no signs of consolidation... HEART:  Regular Rhythm; without murmurs/ rubs/ or gallops- median sternotomy scar...pacemaker in place ABDOMEN:  Obese, soft & nontender; normal bowel sounds; no organomegaly or masses detected. EXT: warm and dry, mild arthritic changes; no varicose veins/ +venous insuffic/ no edema. NEURO:  CN's intact; no focal neuro deficits...  DERM: no lesions seen, no rash etc...   Assessment & Plan:

## 2011-01-21 NOTE — Patient Instructions (Addendum)
We updated your med list today...    We wrote to re-start your Prednisone 20mg  per day for now...    Continue your Symbicort, Spiriva, Singulair, and the Mucinex 1200mg  twice daily WITH LOTS OF WATER>  We will arrange for an ENT eval of your left ear> in the meanwhile try the [facility] or similar ear wax removal kit to loosen the wax...  We will also arrange for a Urology appt to check your voiding symptoms & prostate gland...  Let's plan a brief follow up appt in 3 weeks to see if we can decr the Pred dose at that time.Marland KitchenMarland Kitchen

## 2011-01-22 ENCOUNTER — Encounter: Payer: Self-pay | Admitting: Pulmonary Disease

## 2011-01-22 DIAGNOSIS — N320 Bladder-neck obstruction: Secondary | ICD-10-CM | POA: Insufficient documentation

## 2011-01-22 DIAGNOSIS — H9192 Unspecified hearing loss, left ear: Secondary | ICD-10-CM | POA: Insufficient documentation

## 2011-01-22 NOTE — Assessment & Plan Note (Signed)
He notes hearing loss on left side & ?recent eval by ENT DrByers w/ nothing found per pt hx (we don't have note to review);  Exam shows distal wax on drum & this could be impacting his hearing> rec DEBROX of similar ear wax removal kit & second opinion ENT eval from DrCrossley w/ hearing testing as needed.Marland KitchenMarland Kitchen

## 2011-01-22 NOTE — Assessment & Plan Note (Signed)
He complains of recent BOO symptoms and has seen Urology, DrPeterson in the past;  His PSAs have been normal & last checked 12/11 = 1.75;  We will refer to Urology for further eval..[name]

## 2011-01-22 NOTE — Assessment & Plan Note (Signed)
He is followed by [facility] on Metop, Cozaar, Cardizem, HCT, ASA, Coumadin, etc...  BP stable & denies angina, ch in DOE, palpit, syncope, edema, etc... Continue same Rx & f/u w/ Cards regularly.Todd KitchenMarland Kelly

## 2011-01-22 NOTE — Assessment & Plan Note (Signed)
Presents w/ refractory AB exac>  Wife was concerned he had pneumonia & his CXR is clear (no infiltrate, etc);  Sputum examined & is a thick, ropey, beige but non-purulent;  We discussed RX for refractory attacks w/ Depo/ Pred taper/ Mucinex/ Fluids/ and continuing his regular regimen w/ Symbicort, Spiriva, Singulair, etc..[name]

## 2011-02-10 ENCOUNTER — Encounter: Payer: Self-pay | Admitting: Pulmonary Disease

## 2011-02-11 ENCOUNTER — Ambulatory Visit (INDEPENDENT_AMBULATORY_CARE_PROVIDER_SITE_OTHER): Payer: 59 | Admitting: Pulmonary Disease

## 2011-02-11 ENCOUNTER — Encounter: Payer: Self-pay | Admitting: Pulmonary Disease

## 2011-02-11 DIAGNOSIS — I679 Cerebrovascular disease, unspecified: Secondary | ICD-10-CM

## 2011-02-11 DIAGNOSIS — I2510 Atherosclerotic heart disease of native coronary artery without angina pectoris: Secondary | ICD-10-CM

## 2011-02-11 DIAGNOSIS — M545 Low back pain, unspecified: Secondary | ICD-10-CM

## 2011-02-11 DIAGNOSIS — J4489 Other specified chronic obstructive pulmonary disease: Secondary | ICD-10-CM

## 2011-02-11 DIAGNOSIS — E7800 Pure hypercholesterolemia, unspecified: Secondary | ICD-10-CM

## 2011-02-11 DIAGNOSIS — F411 Generalized anxiety disorder: Secondary | ICD-10-CM

## 2011-02-11 DIAGNOSIS — I4891 Unspecified atrial fibrillation: Secondary | ICD-10-CM

## 2011-02-11 DIAGNOSIS — I739 Peripheral vascular disease, unspecified: Secondary | ICD-10-CM

## 2011-02-11 DIAGNOSIS — J441 Chronic obstructive pulmonary disease with (acute) exacerbation: Secondary | ICD-10-CM

## 2011-02-11 DIAGNOSIS — M1990 Unspecified osteoarthritis, unspecified site: Secondary | ICD-10-CM

## 2011-02-11 DIAGNOSIS — J449 Chronic obstructive pulmonary disease, unspecified: Secondary | ICD-10-CM

## 2011-02-11 DIAGNOSIS — I10 Essential (primary) hypertension: Secondary | ICD-10-CM

## 2011-02-11 DIAGNOSIS — E669 Obesity, unspecified: Secondary | ICD-10-CM

## 2011-02-11 NOTE — Patient Instructions (Signed)
Today we updated your medication list in our EPIC system...    We decided to continue ALL meds the same, except for the PREDNISONE> decrease this to 1/2 tab (10mg ) each AM until your return visit...  Let's get on track w/ our diet & exercise program, the goal is to lose 15-20 lbs!!!  Call for any problems... Keep your scheduled follow up appt in June.Marland KitchenMarland Kitchen

## 2011-02-11 NOTE — Progress Notes (Signed)
Subjective:    Patient ID: Todd Kelly, male    DOB: August 18, 1931, 77 y.o.   MRN: [id number]  HPI  77 y/o WM here for a follow up visit... he has mult med problems including:  COPD/ AB/ ex-smoker;  HBP/ ASHD/ AFib/ Pacer- followed by DrSolomon/ [facility];  Cerebrovasc & peripheral vasc disease;  Hyperlipidemia/ Obesity;  DJD/ LBP;  Anxiety, etc...  ~  October 14, 2010:  79mo ROV- c/o recent dental issues, otherw OK w/ f/u DrSolomon 10/11- he had CDoppler & Nuclear Stress Test which were OK according to the pt (we don't have these reports)... weight remains 252# (no change);  BP controlled on meds;  denies CP, palpit edema;  remains in AFib on Coumadin per [facility];  he is on Simva80- & they want him to continue this med & this dose... meds refilled per request.  ~  January 21, 2011:  Add-on due to refractory AB episode> started 2/12 w/ cough, congestion, weak & tired- given Avelox & Dosepak w/ improvement;  Called again 3/12 w/ recurrent symptoms & wanted refill Rx, this time given Pred 20mg /d for longer course;  Presents now w/ same senario> symptoms returned off Rx & now w/ thick beige mucus, cough/ congestion, etc;  Exam is actually clear & CXR is clear (chr changes, no pneumonia);  Sputum is thick, beige & not purulent;  We discussed Rx w/ Mucinex 1200mg  Bid regularly & lots of fluids, plus continuing his regular Symbicort, Spiriva, Singulair, Flonase, and adding Pred 20mg  tapering sched but STAY on this til rov...      He is also c/o decr hearing in left ear> exam shows hard wax on the drum distally, he says he checked w/ ENT Jearld Fenton recently & told OK, I rec that he start using debrox to loosen wax & seek second opinion from DrCrosley for lavage & ?hearing eval...  He is also c/o BOO symptoms & we will arrange for Urology eval (he has seen DrPeterson in the past)...  ~  February 11, 2011:  3 week ROV & he reports improved> on the Prednisone 20mg /d & we discussed decr to 10mg /d and stay on this... He saw  DrCrossley in the interim & he said canal was "caked over" & he lavaged the wax out & prescribed some ear drops...  Fringe benefit from the Pred "I don't hurt like I did, my body has made a change for the better", he's more active, etc...         Problem List:  BRONCHITIS, CHRONIC, ACUTE EXACERBATION (ICD-491.21) >> see prev notes... COPD (ICD-496) - ex-smoker, freq flairs when off Rx... now taking: SYMBICORT 160- 2spBid,  SPIRIVA daily, SINGULAIR 10mg /d,  MUCINEX 1-2 Bid w/ fluids, + FLONASE... ~  baseline CXR shows COPD, prev median sternotomy, NAD.Marland KitchenMarland Kitchen  ~   CT CHEST 4/09 w/ extensive coronary calcif, prior CABG, no signif abn in the lungs. ~  PFT's 4/09 showed FVC=2.88 (71%), FEV1=2.13 (67%), FEV1/ FVC= 74%, mid-flows= 51%pred... ~  CXR 3/10 showed sl cardiomeg, pleural thickening, pacer, DJD sp, NAD. ~  CXR 12/11 showed s/p CABG, pacer on left, borderline cardiomeg, basilar scarring, NAD. ~  CXR 4/12 showed s/p CABG, pacer, extrapleural fat deposition bilat, NAD...  OBSTRUCTIVE SLEEP APNEA (ICD-327.23) - he does not tolerate his CPAP...  HYPERTENSION (ICD-401.9) controlled on meds:  METOPROLOL 50mg Bid, COZAAR 100/d, CARDIZEM CD 180mg /d, & HCTZ 12.5mg /d...  ~  4/12:  BP = 140/72 and not really checking BP at home as requested... advised to elim  sodium, get on deit & get weight down!!!  ARTERIOSCLEROTIC HEART DISEASE (ICD-414.00) - follow by [facility]... he is s/p CABG 1/05... he is on ASA 81mg /d,  now COUMADIN, + meds as listed above... note 2DEcho w/ LVD & EF= 35-40%... ~   CT CHEST 4/09 w/ extensive coronary calcif, prior CABG, no signif abn in the lungs. ~  cath 4/09 showed patent grafts, some disease in Circ & RCA, patent stent to left renal art... med Rx.  ~  2DEcho 5/11 showed mod conc LVH, mod reduced LVF w/ EF= 35-40% w/ apical AK, dil RA... ~  pt reports Nuclear Stress Test after 10/11 OV w/ DrSolomon was OK.  ATRIAL FIBRILLATION (ICD-427.31) CARDIAC PACEMAKER IN SITU (ICD-V45.01) - he  had syncope w/ 4 fx right ribs and CHB & AFib requiring pacer & Coumadin started... [facility]Solomon [facility] + DrBerry... f/u pacer checks OK & no changes made...  CEREBROVASCULAR DISEASE (ICD-437.9) - S/P Bilat CAE's 2000 & doppler's are followed by DrBerry...  ~  last CDoppler reported by [facility] 4/09 showed mod distal right CCA stenosis. ~  pt reports CDopplers done after 10/11 OV w/ DrSolomon was OK.  PERIPHERAL VASCULAR DISEASE (ICD-443.9) - S/P Rt fem art thrombectomy 2000, and Rt renal art stent 2002... he has all this followed by [facility] now & last doppler & cath showed patent stent.  HYPERCHOLESTEROLEMIA (ICD-272.0) - controlled on SIMVASTATIN 80mg /d & Fish Oil and labs followed by DrBerry (pt will ask for copies to be sent to [place])... he recently added NIASPAN for HDL of 31, but this was stopped due to flushing/ intol...  ~  FLP 12/09 showed TChol 138, TG 220, HDL 34, LDL 60 ~  FLP 12/10 showed TChol 132, TG 253, HDL 33, LDL 61 ~  FLP 12/11 showed TChol 144, TG 200, HDL 33, LDL 71... advised low fat diet & get weight down  OBESITY (ICD-278.00) - weight fluctuates betw 240-250# & he has been unable to lose weight... he is sedentary and we discussed an exercise program for him, since he is limited by LBP. ~  NOTE:  labs 12/09 showed BS= 131, HgA1c= 6.1.[name]. advised low carb diet, get wt down! ~  6/10: wt today= 249#, but pt states down to 233# at home- "just decr portions" ~  12/10:  wt today= 252# post holiday... he states "I'm starving myself to death"... ~  01-Apr-2023:  weight = 252#.[name]. states he's lost 4" at the waist... ~  12/11:  weight = 252# ~  4/12:  Weight = 256#  DIVERTICULOSIS OF COLON (ICD-562.10) COLONIC POLYPS (ICD-211.3) HEMORRHOIDS (ICD-455.6) ~  last colonoscopy was 12/07 by DrStark and showed divertic, polyps 2-61mm size (ademomas), and hems... f/u planned 95yrs.  DEGENERATIVE JOINT DISEASE (ICD-715.90) LOW BACK PAIN, CHRONIC (ICD-724.2) ~  he uses DCN 100 for pain and prev  requested refill of #360 for 3 month supply... ~  12/10: DCN now off the market & Rx for Vicodin #270- up to 3/d as needed for pain.  ANXIETY (ICD-300.00) - he uses ALPRAZOLAM 0.5mg  Tid and prev requested #270 for 3 month supply...   Past Surgical History  Procedure Date  . Pta to right common femoral artery 2001    Dr. [name]  . Carotid endarterectomy 2002    bilateral.  Dr. Hart Rochester  . Coronary artery bypass graft 2005  . Pacemaker placement July 2009    for CHB  Dr. Allyson Sabal.    Outpatient Encounter Prescriptions as of 02/11/2011  Medication Sig Dispense  Refill  . ALPRAZolam (XANAX) 0.5 MG tablet Take 0.5 mg by mouth 3 (three) times daily as needed. Not to exceed 3 per day.       [name] aspirin 81 MG tablet Take 81 mg by mouth daily.        . budesonide-formoterol (SYMBICORT) 160-4.5 MCG/ACT inhaler Inhale 2 puffs into the lungs 2 (two) times daily.        . Coenzyme Q10 (COQ10) 100 MG CAPS Take 1 capsule by mouth daily.        [name] diltiazem (CARDIZEM CD) 180 MG 24 hr capsule Take 180 mg by mouth daily.        [name] docusate sodium (COLACE) 100 MG capsule Take 2 tabs by mouth once daily       . fluticasone (FLONASE) 50 MCG/ACT nasal spray 1 spray by Nasal route 2 (two) times daily.        [name] guaiFENesin (MUCINEX) 600 MG 12 hr tablet Take 1 to 2 tabs by mouth two times a day with fluids for thick phlegm       . hydrochlorothiazide (,MICROZIDE/HYDRODIURIL,) 12.5 MG capsule Take 12.5 mg by mouth daily.        [name] HYDROcodone-acetaminophen (VICODIN) 5-500 MG per tablet Take 1 tab by mouth every 8 to 12 hours as needed for pain. Not to exceed 3 per day.       . hydrocortisone (PROCTOSOL HC) 2.5 % rectal cream as directed.        [name] losartan (COZAAR) 100 MG tablet Take 100 mg by mouth daily.        . meclizine (ANTIVERT) 25 MG tablet Take 1/2 to 1 tab by mouth every 6 hours as needed for dizziness.       . metoprolol (LOPRESSOR) 50 MG tablet Take 50 mg by mouth 2 (two) times daily.        . montelukast  (SINGULAIR) 10 MG tablet Take 10 mg by mouth at bedtime.        . moxifloxacin (AVELOX) 400 MG tablet Take 400 mg by mouth daily. Until gone.       . Omega-3 Fatty Acids (FISH OIL) 1000 MG CAPS Take 1 capsule by mouth daily.        [name] omeprazole (PRILOSEC OTC) 20 MG tablet Take 20 mg by mouth daily.        . polyethylene glycol (MIRALAX / GLYCOLAX) packet Take 17 g by mouth daily.        . simvastatin (ZOCOR) 80 MG tablet Take 80 mg by mouth at bedtime.        [name] tiotropium (SPIRIVA) 18 MCG inhalation capsule Place 18 mcg into inhaler and inhale daily.        [name] warfarin (COUMADIN) 5 MG tablet Take as directed by Dr. Allyson Sabal         Allergies  Allergen Reactions  . Penicillins     REACTION: Allergic to PCN w/ throat swelling \\T \ hives    Review of Systems        See HPI - all other systems neg except as noted...       The patient complains of dyspnea on exertion.  The patient denies anorexia, fever, weight loss, weight gain, vision loss, decreased hearing, hoarseness, chest pain, syncope, peripheral edema, prolonged cough, headaches, hemoptysis, abdominal pain, melena, hematochezia, severe indigestion/heartburn, hematuria, incontinence, muscle weakness, suspicious skin lesions, transient blindness, difficulty walking, depression, unusual weight change, abnormal bleeding, enlarged lymph nodes, and angioedema.  Objective:   Physical Exam     WD, Obese, 77 y/o WM in NAD... GENERAL:  Alert & oriented; pleasant & cooperative... HEENT:  [place]/AT, EOM-full, PERRLA, TMs- distal wax blocking left TM, NOSE-clear, THROAT- sl red w/o exud... NECK:  Supple w/ fair ROM; no JVD; s/p bilat CAE's w/ bruits; no thyromegaly or nodules palpated; no lymphadenopathy. CHEST:  decr BS bilat w/ few scat rhonchi, no rales, no signs of consolidation... HEART:  Regular Rhythm; without murmurs/ rubs/ or gallops- median sternotomy scar...pacemaker in place ABDOMEN:  Obese, soft & nontender; normal bowel sounds; no  organomegaly or masses detected. EXT: warm and dry, mild arthritic changes; no varicose veins/ +venous insuffic/ no edema. NEURO:  CN's intact; no focal neuro deficits...  DERM: no lesions seen, no rash etc...   Assessment & Plan:   COPD/ Chr Bronchitis>  Improved w/ the Pred> asked to decr to 10mg /d now & stay on this...  HBP>  controllled on meds, continue same...  ASHD/ AFib/ Pacer>  Followed by Marland Mcalpine [facility] & stable...  CHOL>  Managed by [employer] he says on Simva80...  Obesity>  Still hasn't been able to lose any wt...  DJD>  Feeling better w/ the Pred...  Anxiety>  Stable on alpraz Prn.Marland KitchenMarland Kitchen

## 2011-02-21 ENCOUNTER — Encounter: Payer: Self-pay | Admitting: Pulmonary Disease

## 2011-03-02 NOTE — Op Note (Signed)
Todd Kelly, Todd Kelly NO.:  [id number]   MEDICAL RECORD NO.:  [id number]          PATIENT TYPE:  INP   LOCATION:  4735                         FACILITY:  [facility]   PHYSICIAN:  Georgiana Spinner, M.D.    DATE OF BIRTH:  Jul 18, 1931   DATE OF PROCEDURE:  DATE OF DISCHARGE:                               OPERATIVE REPORT   PROCEDURE:  Upper endoscopy.   INDICATIONS:  Rule out gastric outlet obstruction.   ANESTHESIA:  Fentanyl 35 mcg and Versed 3.5 mg.   PROCEDURE:  With the patient mildly sedated in the left lateral  decubitus position, the Pentax videoscopic endoscope was inserted in the  mouth, passed under direct vision through the esophagus, which appeared  mildly inflamed in the distal esophagus, but there was no evidence of  Barrett's or esophagitis on entering into the stomach and there was  bowel and some food materials in the liquid form in the fundus but  fundus body antrum were seen and  pylorus was open.  We passed through  into the duodenum bulb and second portion duodenum both of which  appeared normal.  From this point, the endoscope was slowly withdrawn  taking circumferential views of the duodenal mucosa.  The endoscope was  then pulled back into the stomach placed in retroflexion revealed the  stomach from below.  The endoscope was then straightened and withdrawn  taking  circumferential views of the remaining gastric and esophageal  mucosa stopping the suction the liquid material and as much of the gas  from the stomach as we could.  The endoscope was withdrawn.  The  patient's vital signs and pulse oximeter remained stable.  The patient  tolerated the procedure well without complications.   FINDINGS:  Electrical engineer with some fluid contents and small amount  of  food particles and also small amount of this material seen in the  duodenum indicating probably bilious, not just a gastric outlet  obstruction.   PLAN:  We will have radiology place  NG-tube and repeat x-rays and placed  the patient on suction.           ______________________________  Georgiana Spinner, M.D.     GMO/MEDQ  D:  04/14/2008  T:  04/15/2008  Job:  [id number]   cc:   Nanetta Batty, M.D.

## 2011-03-02 NOTE — Cardiovascular Report (Signed)
NAME:  Todd Kelly, Todd Kelly NO.:  [id number]   MEDICAL RECORD NO.:  [id number]           PATIENT TYPE:   LOCATION:                                 FACILITY:   PHYSICIAN:  Nicki Guadalajara, M.D.     DATE OF BIRTH:  Jun 07, 1931   DATE OF PROCEDURE:  02/02/2008  DATE OF DISCHARGE:                            CARDIAC CATHETERIZATION   INDICATIONS:  [name] is a 77 year old male with known  coronary artery disease, who is status post CABG surgery x3 in 2005 with  LIMA graft to the LAD, a vein graft to the first intermediate vessel,  and a vein graft to the second intermediate vessel.  He also has  peripheral vascular disease and is status post stenting of his left  renal artery.  He has a history of labile hypertension, hyperlipidemia,  obesity, obstructive sleep with sleep apnea, and recently he had had  syncope.  He was admitted to [facility] yesterday, February 01, 2008, and noncardiac catheterization was recommended.   PROCEDURE:  After premedication with Valium 5 mg intravenous, the  patient prepped and draped in the usual fashion.  His right femoral  artery was punctured anteriorly and a 5-French sheath was inserted.  Diagnostic catheterization was done utilizing 5-French Judkins for left  and right coronary catheters.  The right catheter was used for selective  angiography into both vein grafts.  A LIMA catheter was necessary for  selective angiography into left internal mammary artery.  A pigtail  catheter was used for RAO ventriculography.  With the patient's  peripheral vascular history, distal aortography was also performed, and  due to questionable area of haziness in this previously placed left  renal stent, selective angiography with the right coronary catheter was  performed in the left renal artery.  The patient tolerated the procedure  well.  Hemostasis was obtained by direct manual pressure.   HEMODYNAMIC DATA:  Central aortic pressure is  128/62.  Left ventricle  pressure 120/90.   ANGIOGRAPHIC DATA:  Left main coronary artery had 30% distal left main  narrowing and branched into an LAD, an apparently 2 intermediate-like  vessels, and a dominant circumflex system.   The proximal portion of the LAD was narrowed to approximately 30-40%  diffusely.  Competitive filling was seen via the LIMA graft.   Intermediate vessel was opacified from the native injection and one can  see some slight retrograde filling in the vein graft supplying this  vessel.   This circumflex was a dominant vessel that had 50% ostial stenosis  followed by 40-50% proximal stenosis before a small marginal branch.  The mid AV groove circumflex had 30% narrowing.  The distal AV groove  circumflex had 50% narrowing between several inferior LV branches.  The  vessel ended in the PDA and posterolateral system.   The right coronary was a small-caliber nondominant vessel, and there was  90% focal stenosis in the very proximal branch of this vessel, which was  of very-small caliber.   The vein graft supplying the intermediate 1 vessel was widely patent.   The vein  graft supplying the intermediate 2 vessel was widely patent.   The LIMA graft supplying the LAD was widely patent without distal  disease beyond the graft insertion.   RAO ventriculography revealed preserved global contractility with an  ejection fraction of approximately 55-60%.  There was a small focal area  of mid anterolateral hypocontractility.   Distal aortography revealed mild tortuosity to the infrarenal aorta.  A  stent was placed in the left renal artery, and there was some question  of possible haziness.  For this reason, selective angiography was done,  which revealed the stent to be widely patent.  Mild staining was noted  within the stented segments, which were seen on the distal aortogram and  visualized with the selective injection.   IMPRESSION:  1. Normal left ventricular  function with focal mild mid anterolateral      hypocontractility, an ejection fraction of approximately 55-60%.  2. Native coronary obstructive disease with 30% distal left main      narrowing, diffuse 40% left anterior descending artery stenosis      proximally, 50% stenosis ostially involving the circumflex vessel      followed by 40-50 % narrowing proximally, 30% in the mid      atrioventricular groove circumflex, and segmental 50% narrowings in      the distal atrioventricular groove in the distal circumflex vessel,      and evidence for 90% stenosis of a very small proximal branch of a      nondominant right coronary artery.  3. Patent left internal mammary artery graft to the left anterior      descending artery.  4. Patent vein graft to the intermediate 1.  5. Patent vein graft to the intermediate 2.  6. Patent stent to the left renal artery.   RECOMMENDATION:  Medical therapy.           ______________________________  Nicki Guadalajara, M.D.     TK/MEDQ  D:  02/02/2008  T:  02/03/2008  Job:  [id number]   cc:   Nanetta Batty, M.D.  Lonzo Cloud. Kriste Basque, MD

## 2011-03-02 NOTE — H&P (Signed)
Todd Kelly, HICKLIN NO.:  [id number]   MEDICAL RECORD NO.:  [id number]          PATIENT TYPE:  EMS   LOCATION:  MAJO                         FACILITY:  [facility]   PHYSICIAN:  Nanetta Batty, M.D.   DATE OF BIRTH:  Feb 18, 1931   DATE OF ADMISSION:  04/11/2008  DATE OF DISCHARGE:                              HISTORY & PHYSICAL   CHIEF COMPLAINT:  Syncope.   HISTORY OF PRESENT ILLNESS:  Mr. Deerman is a 77 year old male followed  by Dr. Allyson Sabal.  He has had previous bypass surgery.  He was just admitted  in April of this year and restudied and had patent grafts with some  distal 50% disease.  He has normal LV function.  He had a syncopal spell  back in April.  He had a tilt table and a CT scan of his chest at that  time, both of which were negative.  Since discharge he has done well  until today, when he had another syncopal spell.  This was unwitnessed.  He apparently was standing by a table and collapsed suddenly.  He fell  and hit his right shoulder and right ribs.  He is now seen in the  emergency room for further evaluation.  He denies any spontaneous  urination or other injuries.  In the emergency room he is splinting and  short of breath because of pain in his right chest.   CURRENT MEDICATIONS:  1. Metoprolol 75 mg b.i.d.  2. Cozaar 100 mg b.i.d.  3. Zocor 80 mg a day.  4. Plavix 75 mg a day.  5. Xanax 0.5 mg b.i.d.  6. Aspirin 81 mg a day.  7. Symbicort inhaler b.i.d.  8. Norvasc 10 mg a day.   He was actually supposed to be taking Norvasc 5 mg a day and Cozaar 100  mg a day.  He did not cut these back.   He reportedly is allergic to PENICILLIN.   SOCIAL HISTORY:  He is married.  He has two stepchildren.  He quit  smoking 9 years ago.  Past.   MEDICAL HISTORY:  1. Remarkable for sleep apnea.  He is intolerant to CPAP.  2. He has had previous renal artery stenting, this was patent and      catheterization.  3. He has had prior carotid  endarterectomy and has moderate disease as      an outpatient by Doppler.  4. He has treated hypertension.  5. Dyslipidemia.  6. He has chronic back problems and chronic back pain.  7. His bypass surgery was in January 2005.  He had a LIMA to the LAD,      a vein graft to the intermediate and a vein graft to the      intermediate #2.  8. Other surgeries include appendectomy, tonsillectomy and a bilateral      carotid endarterectomy in 2002.   The patient has a family history of coronary disease, his father died in  his 37s.  His mother had bypass in her 36s with coronary disease.   REVIEW OF SYSTEMS:  The patient has nocturia but  has not been worked up  for prostate problems.  He has had remote peptic ulcer disease but has  not had any recent GI bleeding.  He has not had palpitations or  tachycardia.  He is chronically short of breath and sees Dr. Kriste Basque for  COPD.  He has had some trace lower extremity edema.   PHYSICAL EXAM:  Blood pressure 141/75, pulse 60, respirations 18, O2  saturation is 98% on room air.  GENERAL:  He is an overweight male in moderate distress but complaining  of right chest pain.  HEENT:  Normocephalic, atraumatic.  Extraocular movements are intact.  NECK:  Without significant JVD.  He has bilateral carotid endarterectomy  scars.  CHEST:  Diminished breath sounds bilaterally with splinting on the  right.  CARDIAC:  Regular rate and rhythm with diminished heart sounds.  ABDOMEN:  Obese, nontender.  Bowel sounds are present.  EXTREMITIES:  Trace pretibial edema bilaterally.  NEUROLOGIC:  Grossly intact.  He is awake, alert and oriented and  cooperative.   IMPRESSION:  1. Recurrent syncope, undetermined etiology, negative workup including      a tilt table and cardiac catheterization April 2009.  2. Coronary artery bypass grafting in the past.  3. Chronic obstructive pulmonary disease.  4. Morbid obesity.  5. Sleep apnea, intolerant to continuous positive  airway pressure.  6. Hypertension, he was felt to be hypotensive back in April.  He      apparently is not on the correct doses of his medications.  7. Treated dyslipidemia.   PLAN:  The patient will be admitted to telemetry.  We will check rib  films and rule out a fracture and possible pneumothorax.  A loop  recorder may be indicated, but the patient at this time cannot lie flat.      Abelino Derrick, P.A.      Nanetta Batty, M.D.  Electronically Signed    LKK/MEDQ  D:  04/11/2008  T:  04/11/2008  Job:  [id number]

## 2011-03-02 NOTE — Op Note (Signed)
NAMEJASSIAH, Todd Kelly NO.:  [id number]   MEDICAL RECORD NO.:  [id number]          PATIENT TYPE:  INP   LOCATION:  3714                         FACILITY:  [facility]   PHYSICIAN:  Ritta Slot, MD     DATE OF BIRTH:  08/11/1931   DATE OF PROCEDURE:  02/06/2008  DATE OF DISCHARGE:  02/06/2008                               OPERATIVE REPORT   INDICATIONS:  Mr. Jamelle Goldston is a 77 year old Caucasian gentleman  with a recent history of syncope.  He is status post coronary artery  bypass grafting in 2005 and recently underwent diagnostic coronary  angiography that demonstrated patent LIMA to the LAD, patent saphenous  vein graft to a diagonal with an EF of 55% to 65%.  He has had no  further syncope since admission but was scheduled tilt table study to  exclude any evidence of vasovagal or neurocardiogenic syncope.   PROCEDURE:  He was brought to the second floor at [facility] cardiac  catheterization laboratory and the patient signed the informed consent,  the patient was placed in the supine position for 5 minutes, noticed  that his resting heart rate was 92 beats and sinus rhythm with a blood  pressure 198/105.  He was then tilted to 70 degrees for 45 minutes and  stopped during that period.  There was no evidence of any arrhythmia. BP  remained stable 160/74 and no evidence of significant brady or  tachyarrhythmias.  His heart rate to increase to maximum heart rate of  128 sinus tach, with a blood pressure drop down to 130/85.  He was  returned to his normal baseline parameters in the supine position after  the tilt was discontinued after 45 minutes.   IMPRESSION AND PLAN:  Essentially negative tilt study with no evidence  of tachy or bradyarrhythmias and no evidence of vasodepressive syncope.   PLAN:  I suspect the syncope was most likely due to dehydration.  The  patient will return home today.  Followup with Dr. Allyson Sabal as an  outpatient.      Ritta Slot, MD  Electronically Signed     HS/MEDQ  D:  02/06/2008  T:  02/07/2008  Job:  [id number]

## 2011-03-02 NOTE — Consult Note (Signed)
NAME:  Todd Kelly, HELLBERG NO.:  [id number]   MEDICAL RECORD NO.:  [id number]          PATIENT TYPE:  INP   LOCATION:  2627                         FACILITY:  [facility]   PHYSICIAN:  Melvyn Novas, M.D.  DATE OF BIRTH:  [birth year]   DATE OF CONSULTATION:  DATE OF DISCHARGE:                                 CONSULTATION   This is a 77 year old married Caucasian right-handed gentleman who has  had two previous admissions this year for problems with vertigo,  lightheadedness, increasing fall risks, and finally with syncope.  The  patient was believed to be orthostatic.  He was twice admitted this year in April and in June, first for what was  supposed to be an orthostatic hypotension and a second time for a  catheterization of the heart.  The patient was found to be in Wenckebach  block rhythm and received a pacemaker.  This second evaluation folowed a syncopal spell that caused him to fall,  fracture 2 ribs on the right side, no. #6 and #9.  He was seen in a  trauma consult and has now voiced a main complaint of dizziness upon  standing up.  By description, he developed a clockwise vertigo , a rotation sensation  with the postural change to standing erect after sitting or supine rest.  Orthostatic blood pressures have been obtained and systolic blood  pressures have been varying greatly since the patient was taken off  antihypertensive to prevent a presumed orthostatic component.   REVIEW OF SYSTEMS:  The patient had a vertigo complaint but also has  sometimes lightheadedness that is not vertiginous in nature. The non-  vertiginous component was present prior to his heart block being  diagnosed.    He has no nausea, sickness, fever, vision impairment, hearing loss, or  shortness of breath associated with it.  He states that he took some  meclizine until recently which has sometimes suppressed his  lightheadedness but it did nothing for last Friday's severe vertigo  spell.   PAST MEDICAL HISTORY:  Coronary artery disease, CABG in 2005,  catheterization in April 2009 which showed patent vessels,  obstructive sleep apnea treated by the [facility],  coronary artery disease with carotid endarterectomy in 2002,  history of hypertension,  hypercholesterolemia,  St. Jude valve,  appendectomy,  had been admitted with a paralytic ileus in the past ,  had a cholecystectomy,  and had admissions for atrial fibrillation.   SOCIAL HISTORY:  The patient is an ex-smoker, married, and retired.   FAMILY HISTORY:  Father died in his 23s of coronary artery disease.  Mother lived well and was reaching old age.   LABORATORY DATA:  H&H was 16.4/47.2, white blood cell count was 12.8, it  is definitely elevated.  Glucose was 95, sodium 139, potassium 4,  creatinine 0.82, BUN 17.   MEDICATIONS:  1. Coumadin 5 mg alternating with 7.5.  2. Coenzyme Q10.  3. Symbicort inhalers t.i.d.  I am not sure if he is treated for      asthma or COPD.  4. Aspirin 81 mg p.o.  daily.  5. Cozaar 100 mg p.o. daily.  6. Simvastatin 80 mg at bedtime.  7. Naprosyn t.i.d.  8. Alprazolam b.i.d.  9. Metoprolol 50 mg b.i.d.  10.Hydrochlorothiazide 12.5 p.o. daily.  11.Colace 200 mg p.o. daily.  12.MiraLax 17 grams daily.  13.Prilosec 20 mg daily.  14.Vicodin p.r.n.   PHYSICAL EXAMINATION:  VITAL SIGNS:  The patient had orthostatic blood  pressures just this morning, nurse documented for a supine position  pressure of 144/76 with a heart rate of 68 and regular.  Seated position blood pressure 119/71, heart rate 70.  Standing up 149/61, heart rate 70.  After walking 159/79, heart rate 74.  The patient states that he was dizzy while sitting up and still dizzy  when he stood up. It helps when he closes his eyes and rests supine. No  diplopia.   The patient has a carotid bruit on the left.  Positive rhonchi left over right lung.  Carotid Dopplers were patent,  however.  Edema 2+, lower extremities bilaterally.  Warm, dry skin.   Mental status ; alert , pleasant, slightly anxious. normal speech, good  memory for medical history and details of this admission.  CN; I can not provoke a nystagmus with rapid head movements, with fast  gaze tracings to the left and tilting the head to the left, thepatient  reports onset of vertigo with clockwise rotation. eyemovements do not  reflect this. symmetric facial motor, sensory. Tongue and Uvula midline,  pupils equal. eyeclosure is tight, grin symmetric.  Motor. Equal grip strength, no pronator drift , all 4 extremeties show  equal reflexes, tone, mass, strenth.  sensory intact to primary modalities.    dictation was interrupted and continued after switch to a  seperate  phone line- part 2.      Melvyn Novas, M.D.  Electronically Signed     CD/MEDQ  D:  05/05/2008  T:  05/06/2008  Job:  [id number]

## 2011-03-02 NOTE — Discharge Summary (Signed)
NAMEKENIEL, RALSTON NO.:  [id number]   MEDICAL RECORD NO.:  [id number]          PATIENT TYPE:  INP   LOCATION:  4735                         FACILITY:  [facility]   PHYSICIAN:  Todd Kelly, M.D.   DATE OF BIRTH:  November 29, 1930   DATE OF ADMISSION:  04/11/2008  DATE OF DISCHARGE:  04/19/2008                               DISCHARGE SUMMARY   [name] is a 77 year old white married male patient of Dr. Nanetta Kelly who has had previous bypass.  He was admitted in April 2009 and  restarting to have patent grafts with some distal 50% disease.  He had  normal LV function.  He had a syncopal spell back in April 2009.  He had  a tilt table and CT scan of his chest at that time, both of these were  negative.  It was thought that he had orthostatic hypotension and his  medications were adjusted.  On admission, he had had another syncopal  episode.  This was unwitnessed.  He was standing by the tail and  collapsed suddenly.  He fell on to his right shoulder and right ribs.  He was seen in the emergency room.  He was having splinting and  shortness of breath.  He was found to have rib fractures on the right  sixth to the ninth rib.  He was admitted.  He was placed on the monitor  and he was found at first to be in a junctional and then Wenckebach and  then he went into 2:1, and complete heart block, thus he went to the  cath lab and had a permanent pacemaker placed by Dr. Ritta Kelly.  In  his left subclavian, he had a St. Jude Medical Zephyr XL DR DDDR [phone]  with 2 St. Jude Medical wires.  The chest x-ray post placement showed no  pneumothorax.  He had a lot of trouble with pain because of his rib  fractures, and we called the trauma service.  He was having bloating and  distention, and a GI consult was called.  It was felt that he had an  early ileus secondary to pain or analgesic medications.  He did undergo  EGD, which showed some food and fluid in his stomach and an  NG tube was  inserted.  The trauma service helped [place] with pain medication for his  fractured ribs.  We had a PT/OT consult and they worked with helping him  ambulate.  During his stay, he was kept on oxygen because of his  reluctance to breathe deeply.  His condition started to improve and he  was seen by Dr. Allyson Kelly and thought to be able to be discharged by April 19, 2008.   LABORATORY DATA:  Sodium 141, potassium 3.9, BUN 22, creatinine 0.83,  chloride 103, and CO2 of 32.  INR was 1.0.  Hemoglobin 13.3, hematocrit  39, WBCs 8.1, and platelets 126.  CK-MB and troponins were all negative  x3.  TSH was 0.912.  On April 14, 2008, he had a CT of his abdomen showed  no abdominal wall injury.  Abdominal mesentery was negative for any  hematoma.  He had acute right lateral rib fractures and right pleural  effusion.  On April 15, 2008, abdominal KUB showed no free air or bowel  obstruction.  On April 13, 2008, chest x-ray showed no pneumothorax.  On  April 11, 2008, rib films showed fracture of his right sixth to ninth  ribs.  EGD again showed some fluid and food.  On the day of discharge,  O2 was removed.  He was ambulated.  He was 94% when ambulated on room  air oxygen.  Also, he did go into atrial fibrillation with a left bundle-  branch block while hospitalized, thus he was started on Coumadin and his  Plavix he had previously been on was discontinued.   DISCHARGE MEDICATIONS:  1. Symbicort 160/4.5 inhaler twice a day.  2. Aspirin 81 mg once a day.  3. Cozaar 100 mg once a day.  4. Simvastatin 80 mg at bedtime daily.  5. Naprosyn 500 mg twice a day x1 month.  6. Alprazolam.  7. Xanax 0.5 mg twice a day.  8. Metoprolol 50 mg twice per day.  9. Warfarin 5 mg alternate with 7.5 mg every other day at 6 o'clock.  10.HCTZ 12.5 mg a day.  11.Colace 200 mg a day.  12.MiraLax 17 g a day.  13.Vicodin 5/325 mg 1-2 every 4-6 hours as needed, he was taking      approximately 4-6 a day while  hospitalized.  14.Prilosec 20 mg daily.   He will follow up with Dr. Allyson Kelly on April 26, 2008, at 4:15.  He was  given pacemaker instructions and told not to do any reaching,  stretching, etc.  His Steri-Strips were removed prior to his discharge.  His pacer incision site was healed.  Only 3 Steri-Strips were reapplied.  He was told he could take a shower on arriving at home.  Case manager  came and he will have home health services including PT, OT, and home  health nurse to follow him until he is better mobilized.  He will also  get a rolling walker and a wheelchair and possibly a hospital bed.  He  does not need oxygen secondary to his O2 sat.  I believe he does not  qualify.   DISCHARGE DIAGNOSES:  1. Syncope with traumatic fall with rib fractures on the right from      rib 6 through 9.  2. Status post temporary pacemaker with a St. Jude Zephyr secondary to      complete heart block.  3. Paroxysmal atrial fibrillation, now on Coumadin.  4. Early ileus, status post esophagogastroduodenoscopy, resolved at      the time of discharge.  5. Chronic obstructive pulmonary disease.  6. Morbid obesity.  7. Obstructive sleep apnea and intolerant to continuous positive      airway pressure.  8. History of arteriosclerotic peripheral vascular disease with renal      artery stenting.  9. History of atherosclerotic cardiovascular disease with prior      history of bypass surgery in January 2005 at which time he had left      internal mammary artery to his left anterior descending and vein      graft to his intermediate.  10.Recent cardiac catheterization showing patent grafts.  11.History of carotid endarterectomy.  12.Dyslipidemia.  13.Hypertension.  14.Chronic back problems and chronic back pain.      Todd Kelly, N.P.      Todd Kelly, M.D.  Electronically Signed    BB/MEDQ  D:  04/19/2008  T:  04/20/2008  Job:  [id number]   cc:   Todd Kelly. Todd Basque, MD

## 2011-03-02 NOTE — Consult Note (Signed)
NAMENICKLOUS, ABURTO              ACCOUNT NO.:  [id number]   MEDICAL RECORD NO.:  [id number]          PATIENT TYPE:  INP   LOCATION:  2627                         FACILITY:  [facility]   PHYSICIAN:  Melvyn Novas, M.D.  DATE OF BIRTH:  Jul 14, 1931   DATE OF CONSULTATION:  05/05/2008  DATE OF DISCHARGE:                                 CONSULTATION   ADDENDUM please merge with first part of this dictation....   VITAL SIGNS:  The patient had orthostatic blood pressure as reported  above.  He has no rash, but he has on the right lower ribcage excessive  pain with deep air exchange and he has still some bruising noted.  He  seems to have a regular pulse here and his EKG rhythm strip indicates  that he is indeed not in atrial fibrillation at this time.  MENTAL STATUS:  Alert and oriented x3.  No aphasia, no apraxia, no  dyskinesias, no acute distress.  HEENT:  The patient can follow rapid moving objects with his gaze and  shows no disconjugation.  His rapid gaze towards his left causes him to  develop vertigo again, which improves as he closes his eyes and  reclines.  He has no associated nausea.  Pupils were equal to light.  Full extraocular movements.  No facial droop.  No facial weakness or eye  closure weakness.  Tongue and uvula are midline.  Range of motion for  the neck is full.  Hearing is not lateralized.  EXTREMITIES:  Full range of motion and equal strengths, tone, and equal  deep tendon reflexes, downgoing Babinski.  He has intact sensation to  primary modalities and reports not to have had any limping.  No drift  and no propulsion with spontaneous gait; however, he is now in a  Coronary Care Unit bed and cannot be ambulated.   ASSESSMENT:  I suspect a vertebrobasilar insufficiency rather than a  vestibulitis or vestibular single component.  The patient does not  develop nystagmus.   The patient had problems with turning to the left as well as with left-  sided rapid gaze  redirection and he had variable blood pressures  systolically that may contribute to a hypoperfusion in his cerebellum  and brainstem.   I suggested a CT angiogram to evaluate the vertebrobasilar flow  directly.  A transcranial Doppler can also be done with vertebral windows, but I do  think it will be less effective unless the study is done with dynamic  motions while transcranial Doppler is obtained.  For gait and balance stabilization and to increase his threshold to  develop further vertigo spells, a physical therapyand  vestibular rehab  consultation would be recommended.   I discussed my findings with Earlean Shawl, PA on-call for [facility].      Melvyn Novas, M.D.  Electronically Signed     CD/MEDQ  D:  05/05/2008  T:  05/06/2008  Job:  [id number]   cc:   Nanetta Batty, M.D.

## 2011-03-02 NOTE — Consult Note (Signed)
Todd Kelly, KRONBERG              ACCOUNT NO.:  [id number]   MEDICAL RECORD NO.:  [id number]          PATIENT TYPE:  INP   LOCATION:  4735                         FACILITY:  [facility]   PHYSICIAN:  Gabrielle Dare. Janee Morn, M.D.DATE OF BIRTH:  [birth year]   DATE OF CONSULTATION:  DATE OF DISCHARGE:                                 CONSULTATION   CHIEF COMPLAINT:  Right rib fractures x2.   HISTORY OF PRESENT ILLNESS:  [name] is a very pleasant 77 year old  white male who has been having syncope at home.  He had an syncopal  episode and fell 2 days ago, hitting his right side.  He had increased  pain there at that time.  It is exacerbated by inspiration.  He was  evaluated in the emergency department.  He was found to have right rib  fractures #6 through #9 with no pneumothorax or hemothorax.  He was  admitted for further syncope workup by the Cardiology Service since that  time.  He was found to have complete heart block and a pacemaker has  been placed.  We are asked to evaluate for further management of his rib  fractures.  His pain has been pretty well controlled on Percocet and  morphine.  However, he has developed an ileus and GI has seen him.  NG  tube placement and bowel rest are planned as well as EGD tomorrow.   PAST MEDICAL HISTORY:  1. Coronary artery disease.  2. Syncope.  3. Vascular disease.  4. Sleep apnea.  5. Hypertension.   PAST SURGICAL HISTORY:  1. Carotid endarterectomy.  2. Coronary artery bypass grafting.  3. Renal artery stenting.  4. Appendectomy.   FAMILY HISTORY:  Coronary artery disease.   ALLERGIES:  Penicillin.   CURRENT MEDICATIONS:  Cozaar, Zocor, aspirin, Norvasc, Xanax, Symbicort,  Lopressor, ibuprofen, Dilaudid, Ambien, Tylenol, vancomycin, and  guaifenesin.   REVIEW OF SYSTEMS:  MUSCULOSKELETAL:  He has right lateral chest pain.  PULMONARY:  He has some exacerbation of his right lateral chest pain  with deep inspiration.  GI:  His abdomen  has been nondistended.  He has  had no bowel movements, but he is passing some flatus, having a large  amount of belching.  Remainder of review of systems was unremarkable.   PHYSICAL EXAMINATION:  VITAL SIGNS:  Temperature 98.2, blood pressure  155/75, heart rate 74, and respirations 20.  GENERAL:  He is awake and alert.  He is oriented.  He is in no distress.  HEENT:  No evidence of trauma or tenderness.  NECK:  No tenderness posteriorly and no step-offs.  LUNGS:  Clear to auscultation.  He does have some right lateral rib  tenderness.  There is no crepitance.  Respiratory effort is good.  HEART:  Regular.  Impulses palpable in the left chest.  ABDOMEN:  Soft, but quite distended.  Bowel sounds are present.  He is  nontender.  SKIN:  Warm and dry.  No rashes.  EXTREMITIES:  Some mild edema.   Chest x-ray shows no pneumothorax.   IMPRESSION:  1. Right rib fractures.  I would recommend  continuing pain medication      and bronchodilators.  We need to increase his pulmonary toilet.      Respiratory therapy evaluation was ordered.  In addition, we will      add incentive spirometry.  If his pain is not adequately      controlled, I would recommend starting a PCA.  At this point, he      seems to be in good control of his pain.  2. Ileus.  NG tube and bowel rest as well as GI evaluation are planned      including a EGD tomorrow morning.  The above was discussed in      detail with the patient.      Gabrielle Dare Janee Morn, M.D.  Electronically Signed     BET/MEDQ  D:  04/13/2008  T:  04/14/2008  Job:  [id number]   cc:   Nanetta Batty, M.D.

## 2011-03-02 NOTE — Discharge Summary (Signed)
[name], [name] NO.:  [id number]   MEDICAL RECORD NO.:  [id number]          PATIENT TYPE:  INP   LOCATION:  3714                         FACILITY:  [facility]   PHYSICIAN:  Ritta Slot, MD     DATE OF BIRTH:  1931/04/25   DATE OF ADMISSION:  02/01/2008  DATE OF DISCHARGE:  02/06/2008                               DISCHARGE SUMMARY   [name] is a 77 year old white married male patient who has a  history of coronary disease status post CABG in 2005, peripheral  vascular disease, status post bilateral carotid endarterectomies in  2002, renal artery stenting in 2002, and a right femoral artery  thrombectomy in 2002.  He has hypertension, hyperlipidemia, obesity, and  untreated obstructive sleep apnea.  He apparently was shaving in the  bathroom in the evening of the admission, he walked to the kitchen, sat  down and went to stand back up, and he complained about feeling flushed  tingling from his waist up and he felt to arm, sat on the floor.  His  wife stated his eyes were open, but he was unresponsive for about a  minute.  He came to without any difficulty.  He denied any chest pain,  shortness of breath or palpitations, and EMS was activated.  He was  brought to the emergency room.  He was seen by Dr. Elsie Lincoln.  He thought  may be he had some orthostatic hypotension and some of his blood  pressure medications were held.  It was decided that he should undergo  CT of his chest because of abnormal D-dimer.  The CT of the chest showed  no abnormalities.  He then underwent cardiac catheterization on February 02, 2008, which showed his grafts were patent.  He had some distal  disease in his circumflex 40-50%.  His LIMA to his LAD was okay.  His  SVG to his RI was okay, and his SVG to his intermediate 1 was okay, his  SVG to his intermediate 2 was okay.  He had a patent stent to the left  renal artery and his RCA was nondominant.  It was small and he had a 90%  stenosis proximally.   His medications were adjusted over the next several days.  He initially  was orthostatic.  This improved with changing of his medications.  It  was also felt that he should be treated for his obstructive sleep apnea.  He had a pulse oximetry checked by nursing overnight.  His PA sats  remain 94%-96% on room air with sleeping.  It did decrease down to 90%,  but they apparently recovered within a few seconds.  The patient was  felt to be ready to go home.  However, the patient did not want to leave  until he had further workup of his syncope.  Thus, a tilt table was  ordered.  The tilt table was done on February 06, 2008, by Dr. Lynnea Ferrier and  it was a negative tilt table.  It was felt that he could have carotid  Doppler's at home, and it was felt that his  syncope was related to  orthostatic hypotension.  The patient had recently been sick on  antibiotics and it was felt that he may have been mildly dehydrated.   LABORATORY DATA:  Labs on February 03, 2008, his sodium was 135, potassium  4.0, BUN 19, creatinine 0.91, TSH was 0.968.  Total cholesterol is 125,  triglycerides 106, HDL of 38, and LDL was 66.  CK-MB's and troponins  were negative.  His D-dimer was 0.48.  His initial creatinine was 1.55.  His BUN was 34. Magnesium was 2.3.  Chest x-ray showed no evidence of  cardiopulmonary disease.  He did have a CT of his head without contrast,  which showed no evidence of acute intracranial abnormality.  A CT of his  chest showed no significant abnormality of the lung, evidence of  coronary artery disease, and prior CABG.   DISCHARGE MEDICATIONS:  1. Metoprolol 75 mg twice per day.  2. Cozaar 100 mg a day.  3. Zocor 80 mg at bedtime.  4. Norvasc 5 mg a day.  5. Plavix 75 mg a day.  6. Xanax 0.5 mg 2 times a day.  7. Darvocet p.r.n.  8. Symbicort 160/4.5 mcg.  9. Aspirin 81 mg a day.  10.CoQ10 every day.  11.Fish oil every day.  12.Multivitamin every day.   DISCHARGE  DIAGNOSIS:  1. Syncope related to orthostatic hypotension.  Medications adjusted.      Also, some partial dehydration with elevated BUN on admission back      down to normal at time of discharge.  2. History of coronary artery disease, status post cardiac      catheterization without significant progressive disease.  3. Obesity.  4. Obstructive sleep apnea.  We will need followup with a sleep study      as an outpatient.  5. History of deep venous thrombosis with renal artery stent patent at      time of cath.  6. History of carotid endarterectomy. We will need outpatient carotid      Doppler's.  7. Hypertension.  8. Hyperlipidemia.      Todd Kelly, N.P.      Ritta Slot, MD  Electronically Signed    BB/MEDQ  D:  02/06/2008  T:  02/07/2008  Job:  [id number]   cc:   Lonzo Cloud. Kriste Basque, MD  Tamala Bari

## 2011-03-05 NOTE — Op Note (Signed)
[facility]  Patient:    Todd Kelly, Todd Kelly                     MRN: [id number] Proc. Date: 06/17/00 Adm. Date:  [id number] Disc. Date: [id number] Attending:  [name] CC:         Lonzo Cloud. Kriste Basque, M.D. [facility]  Vascular Lab, [place]   Operative Report  PROCEDURES PERFORMED: 1. Abdominal aortogram. 2. Bilateral lower extremity angiogram. 3. Percutaneous transluminal angioplasty of the right common femoral artery.  DIAGNOSES: 1. Peripheral vascular disease. 2. Left renal artery stenosis.  HISTORY:  Todd Kelly is a 77 year old gentleman who presents with worsening bilateral lower extremity claudication.  This is described as an achiness in his thighs and calves, which is worse with exertion, and appears to be relieved with rest.  Despite a regular exercise program as well as the administration of Pletal, he has had persistence of pain.  Ankle-brachial indices in October 1999 were 0.93 on the right and 1.05 on the left.  Due to persistent symptoms, he presents now for lower extremity angiogram.  TECHNIQUE:  After informed consent was obtained, the patient was brought to the catheterization lab at [facility] where both groins were sterilely prepped and draped.  The left groin was then infiltrated with 1% Lidocaine, and a 6 French sheath placed into the left femoral artery using the modified Seldinger technique.  A 5 French pigtail catheter was advanced into the abdominal aorta, and an abdominal aortogram performed using power injection of contrast.  This was then brought back to the iliac bifurcation, and angiogram of the bifurcation performed using power injections of contrast. The pigtail catheter was then removed, and a left lower extremity angiogram performed using power injections of contrast through the left femoral sheath. Using an IMA catheter, then the iliac bifurcation was crossed with a Wholey and an angled Glidewire, and an end-hole  catheter placed in the right iliac artery.  Right lower extremity angiogram was then performed using power injections of contrast.  FINDINGS:  Initial findings are as follows: 1. Abdominal aorta:  Normal caliber with mild atheromatous buildup. 2. Renal artery:  The right renal artery is single with no significant    stenosis. 3. Left renal artery single:  This is a large caliber vessel that has a    high-grade narrowing of 70% in the proximal section.  There is some    evidence of poststenotic dilatation. 4. Left lower extremity:  The left iliac artery is widely patent, is of normal    caliber.  There is mild calcification and atheromatous buildup.  The common    femoral artery has a tubular narrowing of 30-40% in the mid section.    Superficial femoral artery is patent.  There are mild irregularities of 30%    in the proximal and distal sections.  Popliteal artery also with narrowing    of 30%.  The trifurcation vessels are patent in their proximal segments,    although the anterior tibial artery is severely diseased and then occluded    proximally.  The distal section reconstitutes via collaterals from the    peroneal artery.  There is two-vessel runoff to the extremity. 5. Right lower extremity:  The right iliac artery has mild calcification and    atheromatous buildup.  It is of normal caliber.   The common femoral artery    has a high-grade narrowing of 70% prior to the bifurcation of the  superficial, femoral and profunda branches.  There is a further narrowing    of 50% at the ostium of the SFA.  Superficial femoral artery is of normal    caliber throughout its course with mild irregularities, as is the popliteal    artery.  The trifurcation vessels are patent in the proximal segment,    although the peroneal artery is occluded in its midsection.  Again,    two-vessel runoff is noted to the distal extremity.  With these findings, we elected to proceed with percutaneous  intervention to the right common femoral artery with the [facility] wire across the iliac bifurcation.  The end-hole catheter and left femoral sheath were exchanged for a 6 [place] Balkan sheath.  Heparin was then administered intravenously, and a Wholey wire placed across the right common femoral and ostial superficial femoral stenoses.  A 6 x 4 powerflex balloon was introduced and used to predilate the lesions at 10 atmospheres at 4 and 60 seconds.  Repeat angiography showed an excellent result with reduction of narrowing to approximately 30%.  There was evidence of a dissection on the lateral wall of the vessel.  The ostium of the SFA had reduction of 50% narrowing to less than 10%.  There was improved flow noted.  A 7 x 2 powerflex balloon was introduced and used to further dilate the common femoral lesion at 10 atmospheres for 180 seconds.  Repeat angiography showed only mild residual disease of 20-30% with some improvement in the dissection.  Final angiography was then performed in various projections showing no evidence of compromise of flow and improved flow to the SFA.  This was deemed an acceptable result.  The sheaths were then removed, manual pressure applied to the left groin until adequate hemostasis was achieved.  The patient tolerated the procedure well and was then transferred to the floor in stable condition.  FINAL RESULT:  Successful PTA of the right common femoral artery with reduction of 70% narrowing to less than 30%.  ASSESSMENT/PLAN:  Todd Kelly is a 77 year old gentleman with bilateral limb pain.  The patient had a single stenosis in the right lower extremity which has been responsive to angioplasty.  At this point, the patients peripheral circulation appears to be adequate.  Should he have the persistence of pain, other causes will be investigated. DD:  06/17/00 TD:  06/19/00 Job: [id number]

## 2011-03-05 NOTE — Cardiovascular Report (Signed)
NAMEJAYLENN, Todd Kelly                        ACCOUNT NO.:  [id number]   MEDICAL RECORD NO.:  [id number]                   PATIENT TYPE:  OIB   LOCATION:  2899                                 FACILITY:  [facility]   PHYSICIAN:  [name], M.D.                   DATE OF BIRTH:  10/02/1931   DATE OF PROCEDURE:  10/08/2003  DATE OF DISCHARGE:                              CARDIAC CATHETERIZATION   PROCEDURES PERFORMED:  1. Left heart catheterization.  2. Left ventriculogram.  3. Selective coronary angiography.  4. Abdominal aortogram.  5. Intravascular ultrasound of the LAD and first diagonal branch.   DIAGNOSES:  1. Severe single vessel coronary artery disease.  2. Normal left ventricular systolic function.  3. Peripheral vascular disease.  4. Unstable angina.   HISTORY:  Todd Kelly is a 77 year old gentleman with known of coronary  artery disease who presents with crescendo now unstable angina.  The patient  underwent stress imaging study showing mild to moderate ischemia in the  anterior septal wall that appears to have increased since prior study in  2002.  There was also some diaphragmatic attenuation.  There appeared to be  some mild cavity dilatation, but overall well preserved LV function.  Due to  progression of symptoms and abnormal Cardiolite finding, he presents for  further assessment.   TECHNIQUE:  Informed consent was obtained.  The patient brought to the  catheterization lab.  A 6 French sheath was placed in the right femoral  artery using the modified Seldinger technique.  A 6 [place] JL-4 and JR-4  catheter was then used to engage the left and right coronary arteries and  selective angiography performed in various projections using manual  injection contrast.  A 6 French pigtail catheter was advanced in the left  ventricle and a left ventriculogram performed using power injection  contrast.  Intravascular ultrasound of the LAD and first diagonal branch  were then  performed using a 6 [place] JL-4 guide catheter and a 0.014 inch  Clinical cytogeneticist wire.  3000 units of heparin were administered systemically.  Repeat angiography after IVUS showed no vessel damage.  The guide catheter  was then removed as was the sheath and manual pressure applied until  adequate hemostasis was achieved. The patient tolerated the procedure well  and was transferred to the floor in stable condition. Pigtail was brought  back in the abdominal aorta and abdominal aortogram performed using power  injections of contrast.   FINDINGS:   LEFT HEART CATHETERIZATION:  1. Left main trunk:  Large caliber vessel.  Calcified distal narrowing of 40-     50%.  2. LAD:  This is a medium-caliber vessel that provides two large diagonal     branches in the proximal segment and small third diagonal branch     distally.  The LAD is calcified in its proximal segment.  There is mild  aneurysmal dilatation followed by moderate disease of 70%.  There is then     severe narrowing of 80% between the two proximal diagonal branches and     moderate disease of 50% distally.  The first diagonal branch has an     ostial narrowing of at least 80% with post stenotic dilatation and then     further disease in the proximal segment of 40-50%.  The second diagonal     branch has mild disease of 30%.  3. Left circumflex artery is dominant.  This is medium caliber vessel that     provides several small marginal branches in the mid and distal section in     the posterior descending artery.  There is moderate disease of 50% in the     AV circumflex.  4. Right coronary artery is nondominant small vessel that provides RV     marginal branch.   LEFT VENTRICULOGRAPHY:  1. Normal end-systolic and end-diastolic dimensions.  2. Overall left ventricular function appears well preserved.  3. Ejection fraction greater than 55%.  4. No mitral regurgitation. There is an area of akinesis in the mid anterior     wall  that is focal in nature.  5. LV pressure is 120/10.  6. Aortic pressure is 120/60.  7. LVEDP equals 30.   ABDOMINAL AORTOGRAPHY:  1. Abdominal  aorta is of normal caliber.  2. There is moderate, but noncritical  atheromatous disease in the     infrarenal segment.  3. There is a stent in the proximal segment of the left renal artery that is     widely patent.  4. The iliac arteries are patent bilaterally with mild disease of 40% in the     right proximal segment. The external right iliac artery appears to have     moderate narrowing of 50-60%.  The common femoral artery has moderate     diffuse disease of 50% as well.   ASSESSMENT AND PLAN:  Todd Kelly is a 77 year old gentleman with severe  single vessel coronary artery disease by angiogram and IVUS.  The proximal  mid LAD stenoses have progressed significantly. There is two to three  quarters of calcification which will make percutaneous intervention somewhat  difficult and potentially compromise the diagonal branches.  The first  diagonal branch also has progression of  disease beyond just the ostium into  the proximal segment and is a vessel of 2.5 mm.  Consideration will be given  towards surgical revascularization with graft to the distal  LAD as well as  the two proximal diagonal branches.                                               [name], M.D.    NG/MEDQ  D:  10/08/2003  T:  10/08/2003  Job:  [id number]   cc:   Lonzo Cloud. Kriste Basque, M.D. [facility]

## 2011-03-05 NOTE — H&P (Signed)
[name]  Patient:    Todd Kelly, Todd Kelly                       MRN: [id number] Adm. Date:  03/22/01 Attending:  Quita Skye. Hart Rochester, M.D. Dictator:   Durenda Age, P.A.-C. CC:         [name], M.D.   History and Physical  DATE OF BIRTH:  1930-10-30  HISTORY OF PRESENT ILLNESS:  [name] is a pleasant 77 year old white male referred by Dr. [name] for evaluation of coronary artery disease.  He presented with complaints of dizziness and lightheadedness to his office.  Suspecting coronary artery disease, the patient underwent Cardiolite test which was positive for ischemia.  He then underwent a cardiac catheterization on Mar 02, 2001 in which coronary artery disease was diagnosed, which is to be treated medically.  Moreover, carotid Dopplers were performed which revealed severe right carotid artery disease with moderate left ICA stenosis.  The patient was referred to Dr. Hart Rochester for evaluation who recommended right CEA in order to reduce the risk of stroke, for the patient is symptomatic.  This is scheduled for March 22, 2001.  The patient complains of several episodes of dizziness for the last three weeks which occur at least once or twice a day.  He also has episodes of bilateral blurred vision which often last between 15 and 20 minutes; the longest has lasted two hours a couple of weeks ago.  Also, according to his wife, the patient has shown signs of confusion and memory loss.  He complains of general decrease in energy and profuse diaphoresis.  No leg or arm weakness.  No paresthesias or facial droop.  No impaired speech. No falling.  He does have mild difficulty standing up.  He also complains of vertigo which is rare, the last episode being a few months ago.  No headache, no fever or vomiting.  No amaurosis fugax.  No seizure or dysphagia.  PAST MEDICAL HISTORY:  Carotid artery disease, hypertension, hypercholesterolemia, BPH, CAD - treated  medically, left renal artery stenosis, PVOD, decreased hearing, remote history of tobacco abuse.  SURGERY:  Status post cardiac catheterization Mar 02, 2001 by Dr. [name]. Status post abdominal aortogram with bilateral lower extremity runoff and PTA of the right common femoral artery by Dr. [name] on June 17, 2000.  MEDICATIONS: 1. Ziac 5 mg b.i.d. 2. Altace 10 mg b.i.d. 3. Aspirin 81 mg q.d. 4. Naproxen 500 mg p.o. b.i.d. 5. Verapamil 240 mg q.d. 6. Pravachol 20 mg q.d. 7. Cardura 4 mg q.d. 8. Plavix 75 mg q.d.  ALLERGIES:  PENICILLIN.  REVIEW OF SYSTEMS:  See HPI and past medical history for significant positives.  No history of diabetes, kidney disease, or asthma.  No angina or arrhythmia.  He has a history of hesitancy and occasional dysuria.  He also has cardiomegaly without diagnosed CHF.  FAMILY HISTORY:  Mother died of pancreatic cancer; she also had a history of CAD.  Father died of complications of heart disease.  One brother alive and well.  SOCIAL HISTORY:  Married, no children.  He is retired.  He used to smoke two to three packs a day of tobacco for 50 years, quit in 1999.  No alcohol intake.  PHYSICAL EXAMINATION:  GENERAL:  Well-developed, well-nourished, moderately-obese 77 year old white male in no acute distress, alert and oriented x 3 at the time of admission.  HEENT:  Normocephalic, atraumatic.  PERRLA, EOMI.  Funduscopic  exam within normal limits.  NECK:  Supple.  No JVD, left bruit.  No thyromegaly or lymphadenopathy.  CHEST:  Symmetrical on inspiration.  LUNGS:  Clear to auscultation bilaterally.  CARDIOVASCULAR:  Regular rate and rhythm, 1/6 systolic murmur.  No rubs or gallops.  ABDOMEN:  Soft, nontender.  Bowel sounds x 4.  No hepatosplenomegaly or masses palpable or abdominal bruit.  GENITOURINARY/RECTAL:  Deferred.  EXTREMITIES:  No clubbing, cyanosis, or edema.  SKIN:  No ulcerations, warm temperature.  Normal hair  pattern.  PERIPHERAL PULSES:  Carotid 2+ bilaterally.  Femoral, popliteal, dorsalis pedis, and posterior tibialis 2+ bilaterally.  NEUROLOGIC:  Sensation normal.  Normal gait.  DTRs 2+ bilaterally.  Muscle strength 5/5.  ASSESSMENT AND PLAN:  A 77 year old white male with history of carotid artery disease who will undergo right CEA by Dr. Hart Rochester in order to prevent the risk of stroke.  This will take place on March 22, 2001.  Dr. Hart Rochester has seen and evaluated this patient prior to the admission and has explained the risks and benefits involved in the procedure and the patient has agreed to continue. DD:  03/22/01 TD:  03/22/01 Job: [id number]

## 2011-03-05 NOTE — Discharge Summary (Signed)
Todd Kelly, Todd Kelly                        ACCOUNT NO.:  [id number]   MEDICAL RECORD NO.:  [id number]                   PATIENT TYPE:  INP   LOCATION:  2022                                 FACILITY:  [facility]   PHYSICIAN:  Gwenith Daily. Tyrone Sage, M.D.            DATE OF BIRTH:  1931-07-09   DATE OF ADMISSION:  10/25/2003  DATE OF DISCHARGE:  11/04/2003                                 DISCHARGE SUMMARY   HISTORY OF PRESENT ILLNESS:  This is a 77 year old male who has noted  increasing weakness over the past one to one and one-half years with  exertion.  Over the past three months, these events have been associated  with diaphoresis and chest discomfort.  The pain was noted to be primarily  mid-chest, nonradiating, exertional but would stop with rest.  The patient  is unaware of any previous myocardial infarctions.   He does have a history of hypertension, hyperlipidemia, and was a remote  smoker for greater than 50 years but did quit six years ago.  He does have a  family history for coronary disease with his father having a myocardial  infarction at age 30 and mother had bypass surgery at age 73.  He also has  one brother known to have coronary artery disease.   The patient underwent a Cardiolite stress test with an estimated ejection  fraction of 0.48 with decrease attenuation of the midanterior wall.  Subsequently, cardiac catheterization was performed by Dr. [name] as  well as intravascular ultrasound of the LAD and first diagonal.  Overall, he  had an ejection fraction of 55% with normal end-diastolic pressures.  His  anatomy showed that his LAD is a medium-size vessel but has two large  diagonal branches and the LAD is calcified in the proximal section.  There  was a 70% proximal stenosis and approximately 80% between the two proximal  diagonal branches.  The first diagonal branch had a stenosis of at least  80%.   The circumflex dominant provides several small marginal  vessels.  The right  coronary was small and nondominant.  The left renal artery noted a stent to  be placed that was patent.  After evaluation of the studies, he was referred  to Dr. Ramon Dredge B. Gerhardt for surgical opinion who agreed the patient was a  candidate for bypass surgery.  The patient had to be off his Plavix prior to  surgery.   Of note, preoperative cardiac doppler studies showed approximately 50%  stenosis on the right and 40% of the left carotid.  The patient was admitted  this hospitalization for surgical procedure.   PAST MEDICAL HISTORY:  1. Left renal artery stent placed in June 19, 2001.  2. Angioplasty of the right leg 2002.  3. History of sleep apnea, now on night time BiPAP mask.   PAST SURGICAL HISTORY:  1. Lipoma removed from under the right arm.  2. Appendectomy.  3. Tonsillectomy.  4. Bilateral carotid endarterectomy in 2002.   MEDICATIONS:  1. Aspirin 81 mg q.d.  2. Plavix 75 mg q.d. which was on hold.  3. Pravachol 20 mg q.d.  4. Verapamil 240 mg q.d.  5. Cardura 4 mg q.h.s.  6. Ziac 10 mg q.d.  7. Cozaar 100 mg q.d.  8. Clonidine 0.1 mg b.i.d.  9. Bisoprolol/hydrochlorothiazide 10/6.25 mg q.d.   ALLERGIES:  PENICILLIN causes severe swelling, seizures, and airway closed  up many years ago.   FAMILY HISTORY:  Please see the history and physical done at the time of  admission.   SOCIAL HISTORY:  Please see the history and physical done at the time of  admission.   REVIEW OF SYMPTOMS:  Please see the history and physical done at the time of  admission.   PHYSICAL EXAMINATION:  Please see the history and physical done at the time  of admission.   HOSPITAL COURSE:  The patient was admitted on October 25, 2003 and taken to  the cardiac operating room where he underwent the following procedure:  Coronary artery bypass grafting x 3 with a left internal mammary artery to  the left anterior descending coronary artery, reverse saphenous vein  graft  to the first intermediate coronary artery, reverse saphenous vein graft to  the second intermediate coronary artery with endoscopic vein harvesting.  Additionally, an excision of a 1.5 x 1 cm full-thickness skin lesion of the  midline upper chest was done.  The patient tolerated the procedure well and  was taken to the surgical intensive care unit in stable condition.   Postoperatively, the patient has done well.  He has maintained stable  hemodynamics.  He was weaned from the ventilator without significant  difficulty.  All routine lines, monitors, drains, devices were discontinued  in the standard fashion.  He did develop a rapid atrial fibrillation  requiring subsequent chemical cardioversion initially with intravenous  amiodarone but additionally with the use of beta blocker as well as  Digitalis.  He has maintained normal sinus rhythm for a few days.   Additionally, the patient did have some difficulty with dyspnea on exertion  and overall pulmonary status to include a bronchitis.  This required  aggressive nebulizer therapy as well as pulmonary toilet.  He has responded  well to this and shown good improvement.  Oxygen has been weaned and he  maintains good saturations on room air.  His incisions are healing well  without signs of infection.  The patient's overall status is felt to be  quite status for tentative discharge on the morning of November 04, 2003  pending morning round reevaluation.   DISCHARGE MEDICATIONS:  1. Humibid LA 2 q.12h.  2. Lopressor 50 mg b.i.d.  3. Aspirin 81 mg q.d.  4. Combivent meter dose inhaler 2 puffs q.6h.  5. Pravachol 20 mg q.d.  6. Amiodarone 400 mg q.d.  7. Digoxin 0.25 mg q.d.  8. Cipro 500 mg b.i.d. for 7 additional days.  9. Clonidine 0.1 mg b.i.d.  10.      Lasix 40 mg for five additional days.  11.      K-Dur 20 mEq for five additional days.  12.      For pain, Ultram 50 mg q.6h.  DISCHARGE INSTRUCTIONS:  The patient received  written instructions regarding  medications, activity, diet, wound care, and follow-up.  Follow-up should  include Dr. [name], his cardiologist, in two weeks.  Additionally, [facility]  will call  him for a three week appointment to follow up with Dr. Ramon Dredge B.  Gerhardt.   FINAL DIAGNOSES:  1. Severe multivessel coronary artery disease, as described above.  2. Previous left renal artery stent placement.  3. Previous angioplasty in the right leg.  4. Previous surgeries including appendectomy.  5. Tonsillectomy.  6. Bilateral carotid endarterectomy.  7. History of sleep apnea on BiPAP.  8. Postoperative anemia.  Most recent hemoglobin and hematocrit dated     October 30, 2003 are stable at 11.1 and 31.7.  9. Postoperative atrial fibrillation, currently in normal sinus rhythm.  10.      Postoperative bronchitis.      Rowe Clack, P.A.-C.                    Gwenith Daily Tyrone Sage, M.D.    Sherryll Burger  D:  11/03/2003  T:  11/03/2003  Job:  [id number]   cc:   Gwenith Daily. Tyrone Sage, M.D.  [address]  [place]  [place] [address]   [name], M.D.   Lonzo Cloud. Kriste Basque, M.D. [facility]

## 2011-03-05 NOTE — Op Note (Signed)
. [facility]  Patient:    Todd Kelly, Todd Kelly                     MRN: [id number] Proc. Date: 03/22/01 Adm. Date:  [id number] Disc. Date: [id number] Attending:  Colvin Caroli                           Operative Report  REDICTATION  PREOPERATIVE DIAGNOSIS:  Severe right internal carotid stenosis -- asymptomatic.  POSTOPERATIVE DIAGNOSIS:  Severe right internal carotid stenosis -- asymptomatic.  OPERATION:  Right carotid endarterectomy with Dacron patch angioplasty.  SURGEON:  Quita Skye. Hart Rochester, M.D.  FIRST ASSISTANT:  Adair Patter, P.A.  ANESTHESIA:  General endotracheal.  DESCRIPTION OF PROCEDURE:  Patient was taken to the operating room and placed in the supine position, at which time satisfactory general endotracheal anesthesia was administered.  The right neck was prepped with Betadine scrubbing solution and draped in routine sterile manner.  An incision was made along the anterior border of the sternocleidomastoid muscle and carried down through subcutaneous tissue and platysma using a Bovie.  The common facial vein and external jugular vein were ligated with 3-0 silk ties and divided, exposing the common, internal and external carotid arteries.  Care was taken not to injure the vagus or hypoglossal nerves, both of which were exposed. There was a calcified atherosclerotic plaque at the carotid bifurcation extending up the internal carotid artery about 3 to 4 cm; distal vessel appeared normal.  A #10 shunt was prepared and the patient was heparinized. The carotid vessels were occluded with vascular clamps, longitudinal opening made in the common carotid with a 15 blade and extended up the internal carotid with the Pott scissors to a point distal to the disease.  The #10 shunt was inserted without difficulty, reestablishing flow in about 2 minutes.  The plaque was at least 90% stenotic in severity.  A standard endarterectomy was then  performed using the elevator and the Pott scissors with an eversion endarterectomy of the external carotid.  The plaque feathered off the distal internal carotid artery nicely, not requiring any tacking sutures.  The lumen was thoroughly irrigated with heparin and saline and all loose debris carefully removed.  The enterotomy was closed with a patch using continuous 6-0 Prolene.  Prior to completion of the closure, the shunt was removed after about 30 minutes of shunt time and following antegrade and retrograde flushing, the closure was completed, with reestablishment of flow initially up the external and then up the internal branch.  Carotids were occluded for less than two minutes for removal of the shunt.  Protamine was then given to reverse the heparin.  Following adequate hemostasis, the wound was irrigated with saline and closed in layers with Vicryl in a subcuticular fashion, sterile dressing applied and patient taken to the recovery room in satisfactory condition. DD:  05/11/01 TD:  05/11/01 Job: [id number]

## 2011-03-05 NOTE — Discharge Summary (Signed)
[name], ARCH NO.:  [id number]   MEDICAL RECORD NO.:  [id number]          PATIENT TYPE:  INP   LOCATION:  2041                         FACILITY:  [facility]   PHYSICIAN:  Todd Kelly, M.D.   DATE OF BIRTH:  12/04/1930   DATE OF ADMISSION:  05/03/2008  DATE OF DISCHARGE:  05/11/2008                               DISCHARGE SUMMARY   DISCHARGE DIAGNOSES:  1. 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  2. History of complete heart block with permanent pacemaker.  3. Coronary artery disease with history of bypass grafting.  4. Chronic left bundle-branch block.  5. Paroxysmal atrial fibrillation, currently in sinus rhythm at      discharge.  6. Obstructive sleep apnea, intolerant to CPAP.  7. Normal left ventricular function.   DISCHARGE CONDITION:  Improved.   PROCEDURES:  None.   Discharge plan would be to send the patient to Dr. Jenne Kelly in  [place], ENT specialist, to assist with resolving the dizziness.   DISCHARGE MEDICATIONS:  1. Aspirin 81 mg daily.  2. Zocor 80 mg daily.  3. Xanax 0.5 mg as needed.  4. Lopressor 50 mg twice a day.  5. MiraLax 17 g.  6. Colace 200 mg daily.  7. Hydrochlorothiazide 12.5 mg daily.  8. Prilosec 20 mg daily.  9. Flonase as needed 2 twice a day.  10.Coumadin 5 mg daily alternating with 7.5 mg.  11.Cozaar 100 mg daily.  12.Cardizem CD 180 mg daily.  13.Vicodin as needed.  14.Naprosyn 500 mg twice daily as needed.  15.Multivitamins and Co-Q10 as before.   DISCHARGE INSTRUCTIONS:  1. Low-sodium heart-healthy diet.  2. Increase activity slowly.  3. Follow up with Dr. Allyson Kelly, call his office for an appointment.  4. Follow up with Dr. Jenne Kelly, ENT, in [place], call for an      appointment.   HISTORY OF PRESENT ILLNESS:  [name] was admitted on May 03, 2008, by Dr. Lynnea Kelly who was on call for Dr. Allyson Kelly with complaints of  dizziness and near-syncope.  This is a 77 year old white male with  history of bypass grafting in the past with complex medical course  recently.  He has had a negative tilt study, negative CT of his chest,  and originally, his dizziness was thought to be related to orthostatic  hypotension.  The patient was discharged home.  He was readmitted in  June 2009 after syncopal spell, unwitnessed, resulting in 6th and 9th  rib fractures.  He also during that hospitalization developed junctional  rhythm, Wenckebach 2:1, and complete heart block.  He had a St. Jude  permanent pacemaker placed.  He also had early ileus during that time  frame.  He had improved and was at home doing fairly well.  On day of  admission, he was sitting at a table, eating lunch, stood up, felt  dizzy, everything was spinning.  He ambulated with his walker to  recliner to rest.  Every time he stood, he would have continued  dizziness and if he sat still resting with his head on the back of  recliner, he felt better  and then he had flushing from shoulders and  felt he would pass out.  He felt this was similar to his episode in June  and April.  Home physical therapy was there and took a blood pressure,  and blood pressure was 180 systolic, came to the emergency room for  further evaluation.  No chest pain.  He was admitted for further  evaluation.  Other history includes bilateral carotid endarterectomy.  Recent Dopplers have been stable.  Chronic anticoagulation on Coumadin  secondary to PAF.  He was slightly orthostatic on May 04, 2008, with  blood pressure lying 144/76, sitting 119/71, then on standing it went  back to 149/61, but the dizziness did improve at that point.  Blood  pressure also continued to be elevated during hospitalization.  Neuro  consult was obtained.  Dr. Vickey Kelly felt vertebral basilar insufficiency  rather than vestibulitis or vestibular single component.  He did not  develop nystagmus.  He had problems with turning to the left, as well as  with left rapid gaze  redirection.  He had variable blood pressures.  The  variable blood pressures may contribute to hypoperfusion in his  cerebellum and brainstem.  He then underwent CT angiogram.  A calcific  plaque in both carotid siphon.  The area is difficult to assess  accurately, but the area was thought to be 30-50% narrowing in the  carotid siphon bilaterally.  Smooth narrowing of the distal right ICA,  probably due to circumferential atherosclerosis.  No anterior, middle,  or posterior cerebral artery stenosis.  CTA of the neck, 70% carotid  stenosis.  He also had CT angio of the chest, no acute findings and  specifically no evidence for PE.   The patient was treated with meclizine and slowly improved, though  dizziness would continue at times.   Also, he had dysmobility and due to the hypertension, we increased his  Cozaar.   We then increased his Antivert to 50 mg 3 times a day.  The patient  continued with dizziness and back pain.  He did walk with a walker.  The  plan was to discharge him, and he will follow up with Dr. Jenne Kelly, ENT,  in [facility] for more treatment of vestibular dizziness and disease.  The patient was discharged and will follow up with Dr. Allyson Kelly and Dr.  Jenne Kelly, and prior to discharge, he ambulated without any dizziness at  all.   PHYSICAL EXAMINATION AT DISCHARGE:  VITAL SIGNS:  Blood pressure 133/73,  pulse 64, respirations 20, afebrile, room air oxygen saturation 92%.  He  was not orthostatic on blood pressures.  LUNGS:  Clear.  ABDOMEN:  Obese.  Good bowel sounds.  HEART:  Regular rate and rhythm and trace edema of lower extremities.   LABORATORY DATA:  Hemoglobin stayed stable at 16.4 on admission,  hematocrit 47.2, WBC was slightly up at 12.8 and platelets were 150.  Platelets did drop down to low at 115 and at discharge, white count was  7.9, neutrophils 75, lymphocytes 18, monocytes 6, eosinophil 1, basophil  1.   Protime on admission 22.1, INR of 1.8, PTT  29.  D-dimer was elevated at  1.69.  CT of chest revealed no PE.   At discharge, protime was 27.0.  INR of 2.3.   Chemistry, sodium 139, potassium 4, chloride 102, CO2 28, glucose 95,  BUN 17, creatinine 0.82.  These remained stable.  Cardiac enzymes, CK  ranged 35-54, MB 1.9-2.3.  Troponin I 0.0-0.02.  BNP  was 195.  TSH  1.375.  TSH as stated.  UA, small amount of hemoglobin.   The patient was pacing during hospitalization.  EKG on admission was  pacing.    Please note that the patient was discharged by Dr. Domingo Sep.      Darcella Gasman. Annie Paras, N.P.      Todd Kelly, M.D.  Electronically Signed    LRI/MEDQ  D:  10/24/2008  T:  10/25/2008  Job:  [id number]   cc:   Ritta Slot, MD

## 2011-03-05 NOTE — Discharge Summary (Signed)
[place]. [facility]  Patient:    Todd Kelly, Todd Kelly                     MRN: [id number] Adm. Date:  [id number] Disc. Date: 04/25/01 Attending:  Colvin Caroli Dictator:   Sherrie George, P.A. CC:         [name], M.D.   Discharge Summary  DATE OF BIRTH:  1930/12/02  ADMISSION DIAGNOSES: 1. Bilateral asymptomatic carotid stenosis, status post right carotid    endarterectomy March 22, 2001. 2. Hypertension. 3. Hypercholesterolemia. 4. Peripheral vascular occlusive disease. 5. Chronic obstructive pulmonary disease. 6. Benign prostatic hypertrophy. 7. Left renal artery stenosis of 70%. 8. Obesity. 9. Periarthritis.  DISCHARGE DIAGNOSES: 1. Bilateral asymptomatic carotid stenosis, status post right carotid    endarterectomy March 22, 2001. 2. Hypertension. 3. Hypercholesterolemia. 4. Peripheral vascular occlusive disease. 5. Chronic obstructive pulmonary disease. 6. Benign prostatic hypertrophy. 7. Left renal artery stenosis of 70%. 8. Obesity. 9. Periarthritis.  PROCEDURES:  Left carotid endarterectomy with Dacron patch angioplasty April 24, 2001, Dr. Hart Rochester.  HISTORY OF PRESENT ILLNESS:  The patient is a 77 year old white male, medical patient of Dr. [name], referred for bilateral carotid stenosis by duplex.  He has been completely asymptomatic.  He was found to have a greater than 95% right ICA stenosis and an 80-85% left ICA stenosis with antegrade flow in both vertebrals.  He is status post right carotid endarterectomy in June 2002.  He had some minor difficulty swallowing postoperatively.  He has recovered from this completely and has returned now for elective left carotid endarterectomy on the day of admission.  PAST MEDICAL HISTORY: 1. Peripheral vascular occlusive disease. 2. Coronary artery disease. 3. Hypertension. 4. Hypercholesterolemia. 5. Left renal artery stenosis by catheterization. 6. BPH. 7. COPD. 8.  Obesity. 9. Arthritis.  ADMISSION MEDICATIONS: 1. Ziac 10 mg q.d. 2. Altace 2 mg b.i.d. 3. Aspirin 81 mg q.d. 4. Verapamil 240 mg q.d. 5. Pravachol 20 mg q.d. 6. Cardura 4 mg q.d. 7. Plavix was discontinued on April 16, 2001. 8. He is also on naproxen 500 mg b.i.d.  ALLERGIES:  He is listed as being allergic to PENICILLIN.  For further history and physical, please see the dictated note.  HOSPITAL COURSE:  The patient was admitted and taken to the operating room and underwent left carotid endarterectomy with Dacron patch angioplasty.  He returned to the recovery room and then 3300 in satisfactory condition.  He was neurologically intact on return to the recovery room.  Had a benign postoperative night, with no significant problems.  His telemetry showed sinus rhythm.  Laboratories the following a.m. show a sodium of 137, potassium of 3.7, chloride of 103, CO2 of 27, glucose of 111, BUN of 20, creatinine of 0.9, and calcium of 8.4.  White count is 9.2, hemoglobin is 12.2, hematocrit is 34.8, platelet count is 142,000.  He remains neurologically intact.  He was mobilized on the first postoperative morning and, at this point, we anticipate his discharge at lunchtime.  The patient will need to be able to eat, drink, walk, and void without difficulties prior to discharge.  FOLLOW-UP:  Follow up is scheduled for Tuesday, May 09, 2001, at 1:30 p.m. to see Dr. Hart Rochester.  ACTIVITY:  Light to moderate, as instructed.  No lifting over 10 pounds.  No driving.  No strenuous activity.  MEDICATIONS:  He is to resume his preadmission medicines as before.  He was given Tylox 1-2  p.o. q.4h. p.r.n. for pain.  CONDITION ON DISCHARGE:  Improving. DD:  04/25/01 TD:  04/25/01 Job: [id number]

## 2011-03-05 NOTE — Discharge Summary (Signed)
Todd Kelly, Todd Kelly                        ACCOUNT NO.:  [id number]   MEDICAL RECORD NO.:  [id number]                   PATIENT TYPE:  INP   LOCATION:  2010                                 FACILITY:  [facility]   PHYSICIAN:  [name], M.D.                   DATE OF BIRTH:  1931/01/04   DATE OF ADMISSION:  03/05/2004  DATE OF DISCHARGE:  03/07/2004                                 DISCHARGE SUMMARY   DISCHARGE DIAGNOSES:  1. Admitted with hypertensive crisis.  2. Medical adjustment while at [facility]. [facility], added     Norvasc 10 mg daily and increased clonidine 0.2 mg twice daily.  3. CT angio of the abdomen was done Mar 05, 2004.   SECONDARY DIAGNOSES:  1. History of hypertension with renal artery stenosis, status post left     renal artery stent placement June 19, 2001.  2. History of peripheral vascular occlusive disease with bypass thrombectomy     of the common femoral artery right lower extremity 2002, last ankle     brachial index September 29, 2003, was 0.49 on the right and 0.96 on the     left.  3. Coronary artery disease with abnormal stress Cardiolite study September 29, 2003, mild left ventricular dilatation, ejection fraction 48% leading     to coronary artery bypass graft surgery October 25, 2003.  4. Dyslipidemia.  5. Strong family history of coronary artery disease.  6. Remote tobacco habituation.  7. Obstructive sleep apnea with BiPAP.  8. Status post lipoma resection right arm.  9. Status post appendectomy, tonsillectomy.  10.      Extracranial cerebrovascular occlusive disease, status post     bilateral carotid endarterectomies 2002.   PROCEDURE:  Mar 05, 2004, CT angiogram.  The study shows that the stent in  the left renal artery is widely patent.  The inferior dominant right renal  artery has a 70% focal stenosis with mild post stenotic dilatation.  There  was a 1.5 cm long 70% stenosis in the SMA, 1 cm distal to its takeoff.  The  celiac and IMA are patent.   DISPOSITION:  Mr. Jeziah Kretschmer is ready for discharge hospital day #3.  His medications have been adjusted to bring his systolic blood pressure  below 150.  He has had no more lightheadedness.  His rhythm has been a sinus  rhythm.  He is ambulating independently.  He has been afebrile.   DISCHARGE MEDICATIONS:  1. Prevacid 30 mg daily as needed.  2. Enteric coated aspirin 81 mg daily.  3. Clonidine 0.2 mg twice daily.  4. Cozaar 100 mg twice daily.  5. Norvasc 10 mg daily.  6. Lopressor 75 mg twice daily.  7. Lipitor 80 mg daily at bedtime.   DISCHARGE DIET:  Low sodium, low cholesterol diet.   FOLLOW UP:  He will keep an August  visit that he has scheduled with Dr. Emilie Rutter. Pulsipher for Thursday, Mar 12, 2004.   BRIEF HISTORY:  Mr. Coutts is a 77 year old gentleman.  He has a history of  peripheral vascular occlusive disease and extracranial cerebrovascular  occlusive disease.  He also has dyslipidemia, a strong family history of  coronary artery disease.  He was found to have severe three vessel CAD and  is status post coronary artery bypass graft surgery October 25, 2003.  He has  been complaining of lightheadedness and dizziness and some mild blurred  vision.  He was seen in cardiac rehab on Mar 04, 2004, and found to have  elevated systolic blood pressure greater than 200 mm.  Follow-up was made  for the office Mar 05, 2004.  The patient does relate a history of feeling  lightheaded for the last couple of days.  Also mild shortness of breath.  He  is having no discomfort in the chest.  No significant lower extremity edema.  No syncope or presyncope.  Even prior to this admission, the patient has  some increase in blood pressures requiring advancement of his  antihypertensives; however, he has had some profound lability lately with  unclear etiology.   The plan is to admit the patient for monitoring and start him on a calcium  channel blocker  and advance his clonidine.  Further medication adjustments  will be made as necessary upon observation at [facility]. [facility].  In addition, the renal arteries will be reimaged.   HOSPITAL COURSE:  Mr. Hoge was admitted to [facility]. [facility] from the office of [facility] Mar 05, 2004.  He underwent  CT angiogram of the abdomen the same day with the finding as dictated above.  On blood pressure medication adjustments, his systolic blood pressure has  moderated.  He is found to have some bradycardia at times and his Lopressor  has been held on one or two occasions here at [facility] [employer].  He will go home with medications and follow-up as dictated above.      Maple Mirza, P.A.                    [name], M.D.    GM/MEDQ  D:  03/07/2004  T:  03/09/2004  Job:  [id number]   cc:   Carole Binning, M.D. [facility]

## 2011-03-05 NOTE — Discharge Summary (Signed)
[place]. [facility]  Patient:    Todd Kelly, Todd Kelly Visit Number: [id number] MRN: [id number]          Service Type: Dictated by:   Rozell Searing, P.A. Adm. Date:  07/07/01 Disc. Date: 07/08/01                             Discharge Summary  NO DICTATION Dictated by:   Rozell Searing, P.A. DD:  07/08/01 TD:  07/08/01 Job: [id number]

## 2011-03-05 NOTE — Op Note (Signed)
[place]. [facility]  Patient:    Todd Kelly, Todd Kelly                       MRN: [id number] Proc. Date: 04/24/01 Attending:  Quita Skye. Hart Rochester, M.D. CC:         [name], M.D.   Operative Report  PREOPERATIVE DIAGNOSIS:  Severe left internal carotid artery stenosis, asymptomatic.  POSTOPERATIVE DIAGNOSIS:  Severe left internal carotid artery stenosis, asymptomatic.  OPERATION:  Left carotid endarterectomy with Dacron patch angioplasty.  SURGEON:  Quita Skye. Hart Rochester, M.D.  FIRST ASSISTANT:  Sherrie George, P.A.  ANESTHESIA:  General endotracheal.  HISTORY OF PRESENT ILLNESS:  This patient recently was found to have severe bilateral internal carotid artery stenosis and has previously undergone a right carotid endarterectomy with no difficulty.  He now returns to have an 80% left internal carotid stenosis corrected.  DESCRIPTION OF PROCEDURE:  The patient was taken to the operating room, placed in the supine position, at which time satisfactory general endotracheal anesthesia was administered.  The left neck was prepped with Betadine scrub and solution, draped in a routine sterile manner.  Incision was made along the anterior border of the sternocleidomastoid muscle, carried down through subcutaneous tissue and platysma using the Bovie.  The common facial vein and external jugular vein was ligated with 3-0 silk ties and divided exposing the common internal and external carotid arteries.  Care was taken not to injure the vagus or hypoglossal nerves, both of which were exposed.  There was calcified atherosclerotic plaque at the carotid bifurcation extending up the internal carotid artery about 3-4 cm.  The distal vessel appeared normal. ______ were repaired, and the patient was heparinized.  The carotid vessels were occluded, and vascular clamps along the ______ opening made and the common carotid with a 15 blade extending up the internal carotid with  the Potts scissors to a point distal to the disease.  The plaque was at least 90% stenotic in severity.  The distal vessel appeared normal.  A #10 shunt was inserted without difficulty, reestablishing flow in about two minutes.  A standard endarterectomy was then performed using the elevator and the Potts scissors with ______ endarterectomy at the external carotid artery.  The plaque feathered off the distal internal carotid artery nicely, not requiring any tacking sutures.  The lumen was thoroughly irrigated with heparin saline and all this debris carefully removed, and the arteriotomy was closed with a patch using continuous 6-0 Prolene.  Prior to completion of the closure, the shunt was removed after about 35 minutes of shunt time following antegrade and retrograde flushing.  The closure was completed with reestablishment of flow initially up the external and up the internal branch.  The carotid was occluded for less than two minutes for removal of the shunt.  Protamine was then given to reverse the heparin.  Following adequate hemostasis the wound was irrigated with saline, closed in layers with Vicryl in a subcuticular fashion, sterilely draped applied, and the patient taken to the recovery room in satisfactory condition. DD:  04/24/01 TD:  04/24/01 Job: [id number]

## 2011-03-05 NOTE — Discharge Summary (Signed)
[place]. [facility]  Patient:    Todd Kelly, Todd Kelly                     MRN: [id number] Adm. Date:  [id number] Disc. Date: [id number] Attending:  Colvin Caroli Dictator:   Adair Patter, P.A.C. CC:         Quita Skye. Hart Rochester, M.D.  [name], M.D.   Discharge Summary  DATE OF BIRTH:  1931/09/19.  ADMISSION DIAGNOSIS: Right carotid artery stenosis.  SECONDARY DIAGNOSES: 1. Hypertension. 2. Hypercholesterolemia. 3. Peripheral vascular disease. 4. Chronic obstructive pulmonary disease. 5. Benign prostatic hyperplasia.  DISCHARGE DIAGNOSIS: Right carotid artery stenosis.  HOSPITAL PROCEDURE:  Right carotid endarterectomy.  HOSPITAL COURSE:  [name] was admitted to [facility]. [facility] on March 22, 2001, secondary to his right carotid artery stenosis.  On this date, Quita Skye. Hart Rochester, M.D., performed a right carotid endarterectomy with Dacron patch angioplasty.  No complications noted during the procedure. Postoperatively, the patient was found to be neurologically intact. He had no complications during his hospital course and was subsequently discharged home in stable condition on March 23, 2001.  MEDICATIONS AT TIME OF DISCHARGE: 1. Tylox one to two tablets every four to six hours as needed for pain. 2. Ziac 5 mg one tablet twice daily. 3. Altace 10 mg one tablet twice daily. 4. Aspirin 81 mg one tablet daily. 5. Verapamil 240 mg one tablet daily. 6. Pravachol 20 mg one tablet daily. 7. Cardura 4 mg one tablet daily.  DISCHARGE ACTIVITY:  The patient is told to avoid driving, strenuous activity and lifting heavy objects.  DISCHARGE DIET:  Low fat, low salt.  WOUND CARE:  The patient was told he could shower and clean his incision with soap and water.  DISPOSITION:  Home.  FOLLOW-UP:  The patient was told to follow up with Dr. Hart Rochester at his office on Tuesday, April 04, 2001, at 8:20 a.m. DD:  03/23/01 TD:  03/24/01 Job:  [id number]

## 2011-03-05 NOTE — Assessment & Plan Note (Signed)
[facility]                         GASTROENTEROLOGY OFFICE NOTE   Todd Kelly, Todd Kelly                     MRN:          [id number]  DATE:09/29/2006                            DOB:          [birth year]    REFERRING PHYSICIAN:  Lonzo Cloud. Kriste Basque, MD   REASON FOR REFERRAL:  Personal history of adenomatous colon polyps.   Todd Kelly is a very nice 77 year old white male that I have evaluated  in the past. Colonoscopy in November 1995 revealed adenomatous colon  polyps. Surveillance colonoscopy in November 1999 revealed hyperplastic  polyps. Since I saw him in 1999, he has undergone bilateral carotid  endarterectomies in 2002 and a three-vessel CABG in 2005. He relates  episodes of occasional mild constipation with small amounts of bright  red blood on the tissue paper following a constipated bowel movement. He  has no other colorectal complaints. He specifically denies any change in  stool caliber, change in bowel habits, abdominal pain or weight loss. No  family history of colon cancer, colon polyps or inflammatory bowel  disease.   PAST MEDICAL HISTORY:  1. Coronary artery disease.  2. Peripheral vascular disease.  3. Hypertension.  4. Hyperlipidemia.  5. Arthritis.  6. Anxiety.  7. Rhinitis.  8. Sleep apnea.  9. Status post CABG x3 in 2005.  10.Status post bilateral carotid endarterectomies 2002.  11.Status post renal artery stent placements.  12.Status post appendectomy.  13.Obesity.   CURRENT MEDICATIONS:  Listed on the chart, updated and reviewed.   MEDICATION ALLERGIES:  PENICILLIN.   SOCIAL HISTORY:  Per the handwritten form.   REVIEW OF SYSTEMS:  Per the handwritten form.   PHYSICAL EXAMINATION:  GENERAL:  Obese white male in no acute distress.  VITAL SIGNS:  Height 6 feet, weight 238 pounds. Blood pressure 160/80,  pulse 72 and regular.  HEENT:  Anicteric sclera. Oropharynx clear.  CHEST:  Clear to auscultation bilaterally.  CARDIAC:  Regular rate and rhythm without murmurs appreciated.  ABDOMEN:  Soft, nontender, nondistended. Normal active bowel sounds, no  palpable organomegaly, masses or hernias.  RECTAL:  Deferred until time of colonoscopy.  NEUROLOGIC:  Alert and oriented x3. Grossly nonfocal.   ASSESSMENT/PLAN:  Intermittent small volume hematochezia associated with  intermittent constipation. Personal history of adenomatous colon polyps.  He is advised to increase his fluid and fiber intake on a daily basis.  The risks, benefits, and alternatives to colonoscopy with possible  biopsy and possible polypectomy discussed with the patient and he  consents to proceed. We have already received written approval from Dr.  Nanetta Batty to hold aspirin and Plavix prior to colonoscopy. Will  plan to hold Plavix for 5 days prior to his procedure and aspirin at 81  mg daily can be continued through the procedure.     Venita Lick. Russella Dar, MD, [facility]  Electronically Signed    MTS/MedQ  DD: 09/29/2006  DT: 09/29/2006  Job #: [id number]   cc:   Lonzo Cloud. Kriste Basque, MD

## 2011-03-05 NOTE — Cardiovascular Report (Signed)
[place]. [facility]  Patient:    Todd Kelly, Todd Kelly                     MRN: [id number] Proc. Date: 03/02/01 Adm. Date:  [id number] Disc. Date: [id number] Attending:  [name] CC:         Lonzo Cloud. Kriste Basque, M.D. [facility]  Cardiovascular Thoracic Surgeons   Cardiac Catheterization  PROCEDURES PERFORMED: 1. Left heart catheterization. 2. Left ventriculogram. 3. Selective coronary angiography. 4. Abdominal aortogram. 5. Intravascular ultrasound of the left anterior descending coronary artery    and first diagonal branch.  DIAGNOSES: 1. Moderate coronary artery disease. 2. Normal left ventricular systolic function. 3. Peripheral vascular disease.  HISTORY:  Mr. Pitta is a 77 year old gentleman with multiple cardiac risk factors who presents with progressive lightheadedness and dizziness.  The patient also has a history of hypertension.  Recent stress imaging study showed evidence of a small prior infarction in the anterior wall with trivial peri-infarct ischemia and mild to low normal left ventricular systolic function.  Carotid ultrasounds showed severe narrowing of the right internal carotid artery and at least moderate narrowing of the left internal carotid artery.  The patient presents now for cardiac catheterization to assess the extent of his coronary disease in preparation for possible carotid endarterectomies.  TECHNIQUE:  Informed consent was obtained and the patient was brought to the catheterization laboratory.  A 6-French sheath was placed in the right femoral artery.  Left heart catheterization and selective angiography were then performed in the usual fashion using preformed 6-French Judkins catheters. Abdominal aortogram was also performed.  We then proceeded with intravascular ultrasound of the LAD and first diagonal branch.  This was done using a 6-French left #4 guide catheter, a short high-torque floppy wire, and an Atlantis IVUS  catheter.  Heparin 2500 units were administered.  The patient tolerated the procedure well.  Pre and post IVUS angiography showed no evidence of vessel damage.  At the termination of the case, the catheters and sheath were removed and manual pressure was applied until adequate hemostasis was achieved.  FINDINGS: 1. Left main trunk:  Normal caliber.  There is moderate calcification and    distal narrowing of 30% is noted.  This is confirmed by IVUS.  2. Left anterior descending coronary artery:  This is a normal caliber vessel    that provides two diagonal branches in the proximal segment.  The LAD is    calcified in the proximal mid section.  There is mild diffuse disease by    angiogram of 30% to 50% in the proximal mid section.  There does appear to    be some thinning of contrast after the second diagonal branch.  It is    unclear whether this represents stenosis or calcification.  The first    diagonal branch has ostial narrowing of 70%, with diffuse disease of 50%    thereafter.  The second diagonal branch is a smaller caliber vessel with    mild irregularities.     Intravascular ultrasound of the LAD showed this to be a large caliber    vessel, 3.5 mm in its mid and distal reference.  There was moderate diffuse    disease in the proximal mid section without critical stenosis.  IVUS of the    first diagonal branch also confirmed calcification and moderate diffuse    disease.  There was an area of critical narrowing in the proximal segment    of  the vessel.  This was a 2.5 mm vessel in its reference diameter with    reduction of lumen diameter to approximately 1.2 mm.  3. Left circumflex artery:  This is a medium caliber vessel and is dominant.    This provides several marginal branches in the posterior descending artery.    There is mild diffuse disease in the AV circumflex of 30%.  4. Right coronary artery:  Nondominant.  This is a small caliber vessel that    provides two RV  marginal branches.  There is moderate disease of 50% at the    bifurcation.  5. Left ventricle:  Mildly dilated end-systolic and end-diastolic dimensions.    Overall, left ventricular function appears to be well preserved with    ejection fraction of approximately 50% to 55%.  There does appear to be    some mild hypokinesis of the mid anterior wall with possible suggestion of    a small aneurysm.  No mitral regurgitation is noted.  LV pressure is    135/10, aortic 135/65, LV-EDP is 19.  6. Abdominal aortogram:  The abdominal aorta is of normal caliber.  There is    mild diffuse atheromatous disease extending into the iliac arteries.  No    critical stenosis or dilatation.  The right renal artery is single and    patent, with mild narrowing of 30% in its proximal segment.  Left renal    artery is a large caliber vessel with moderate disease of 70% in the    proximal segment.  ASSESSMENT/PLAN:  Mr. Demetriou is a 77 year old gentleman with moderate coronary artery disease.  He has severe disease in the first diagonal branch, but noncritical disease in the left anterior descending coronary artery.  We will thus treat this medically.  Further assessment of his anterior wall will be performed with an echocardiogram.  Carotid ultrasound on January 19, 2001, shows severe right internal carotid artery narrowing of 80% to 99%, with left internal carotid artery narrowing of 60% to 79%.  External carotids appear to be within normal limits and vertebral arteries were antegrade flow.  The patient will be referred for further assessment of carotid endarterectomy. DD:  03/02/01 TD:  03/02/01 Job: [id number]

## 2011-03-05 NOTE — H&P (Signed)
Todd Kelly, Todd Kelly                        ACCOUNT NO.:  [id number]   MEDICAL RECORD NO.:  [id number]                   PATIENT TYPE:  INP   LOCATION:  2010                                 FACILITY:  [facility]   PHYSICIAN:  [name], M.D.                   DATE OF BIRTH:  Mar 11, 1931   DATE OF ADMISSION:  03/05/2004  DATE OF DISCHARGE:                                HISTORY & PHYSICAL   CHIEF COMPLAINT:  Lightheadedness.   HISTORY:  Todd Kelly is a 77 year old gentleman with a history of  peripheral vascular disease, dyslipidemia, sleep apnea, hypertension,  coronary artery disease, status post surgical revascularization on October 25, 2003, who presents with complaints of lightheadedness, dizziness and mild  blurred vision.  The patient was seen in cardiac rehab yesterday and was  found to have elevated systolic blood pressure greater than 200 mmHg.  As it  is not felt to be severely symptomatic, followup was made for today.  The  patient does relate a history of feeling lightheaded for the last couple of  days.  He has also had some mild shortness of breath.  No chest discomfort,  no significant lower extremity edema, no syncope or presyncope.  He has not  had any transient neurologic deficits.   PAST MEDICAL HISTORY:  1. Hypertension with renal artery stenosis, status post left renal artery     stent placement on June 19, 2001.  2. Peripheral vascular disease with __________  to the right lower extremity     in 2002.  Last ankle/brachial index on September 27, 2003, was 0.49 on the     right and 0.96 on the left.  3. Coronary artery disease with abnormal stress __________ study on September 29, 2003 with mild LV dilatation, EF at 48%, leading to surgical     revascularization by Dr. Tyrone Sage on October 25, 2003.  4. Dyslipidemia with LFTs on Mar 04, 2004 showing total cholesterol of 91,     triglycerides 75, HDL 28, LDL 47, LFTs within normal limits.  CK 117, MB  3.9.  5. Obstructive sleep apnea on night time BiPAP.  6. Lipoma resection on the right arm.  7. Status post appendectomy, tonsillectomy, bilateral carotid endarterectomy     2002.   ALLERGIES:  PENICILLIN which caused swelling and respiratory distress many  years ago.   CURRENT MEDICATIONS:  1. Aspirin 81 mg per day.  2. Clonidine 0.1 mg b.i.d.  3. Cozaar 100 mg b.i.d.  4. Lopressor 75 mg b.i.d.  5. Lipitor 80 mg daily.  6. Prevacid 20 mg per day as needed.   SOCIAL HISTORY:  The patient has a history of tobacco use greater than 50  pack years, quit six years ago.   FAMILY HISTORY:  Has strong family history of coronary disease in his father  having a myocardial infarction in his 27's.  Mother  had bypass surgery in  her 71's and he has one brother with coronary disease.   PHYSICAL EXAMINATION:  GENERAL:  He is an overweight, white male in no acute  distress.  VITAL SIGNS:  Blood pressure 220/90, pulse 56, weight 221 pounds.  NECK:  Carotid upstrokes are brisk.  Soft bruits bilaterally.  HEART:  Regular rate, systolic ejection murmur.  LUNGS:  Relatively clear to auscultation.  ABDOMEN:  Protuberant, nontender.  EXTREMITIES:  Trace pedal edema.  NEUROLOGIC:  Motor and sensory 5/5.  Sensory is intact to touch.   ASSESSMENT/PLAN:  Todd Kelly is a 77 year old gentleman with coronary  disease, peripheral vascular disease, dyslipidemia who presents with  hypertensive crisis. The patient has had some increase in blood pressure  recently requiring advancement of his anti-hypertensives.  However, he has  had profound lability lately of unclear etiology.   Due to significant symptomatology, we plan on admitting the patient for  monitoring.  We will start him on calcium channel blocker and advance his  clonidine to 0.2 mg b.i.d.  We will make further medication adjustments in  hospital.  In addition, we will need to reimage his renal arteries.                                                 [name], M.D.    Melton Alar  D:  03/05/2004  T:  03/06/2004  Job:  [id number]

## 2011-03-05 NOTE — Consult Note (Signed)
NAME:  Todd Kelly, Todd Kelly                        ACCOUNT NO.:  [id number]   MEDICAL RECORD NO.:  [id number]                   PATIENT TYPE:  INP   LOCATION:  NA                                   FACILITY:  [facility]   PHYSICIAN:  Edward B. Tyrone Sage, M.D.            DATE OF BIRTH:  02-07-1931   DATE OF CONSULTATION:  10/24/2003  DATE OF DISCHARGE:                                   CONSULTATION   OFFICE CONSULTATION NOTE/HISTORY AND PHYSICAL   REQUESTING PHYSICIAN/FOLLOWUP CARDIOLOGIST:  [name], M.D.   PRIMARY CARE PHYSICIAN:  Dr.  Kriste Basque.   REASON FOR CONSULTATION:  Coronary artery disease.   HISTORY OF PRESENT ILLNESS:  The patient is a 77 year old male with noted  increasing weakness over the past 1-1/2 years with exertion.  Over the past  3 months he has had increasing episodes of diaphoresis and chest discomfort,  primarily with exertion. The pain is midchest, nonradiating, brought on with  exertion, but stops with rest.  Walking to his mailbox approximately 75 feet  will bring on discomfort. He probably had a myocardial infarction in the  past based on changes in LV function, but he is unaware of when this  occurred. He does have hypertension, has hyperlipidemia, is on Lipitor.  Denies diabetes.  Was a remote smoker for 54 years, but quit 6 years ago.   His father had a myocardial infarction at age 53, mother had bypass surgery  at age 48. He has 1 brother without known coronary disease.  The patient  denies any previous stroke, but has had had known cerebrovascular disease  and is status post bilateral carotid endarterectomies in 2002.  He has known  peripheral vascular disease with claudication; and has severe COPD.   PAST MEDICAL HISTORY:  1. Includes a left renal artery stent placed in September 2002.  2. Angioplasty of the right leg in 2002.  3. History of sleep apnea, now on BiPAP.   PAST SURGICAL HISTORY:  1. Includes lipoma removed from under the right arm.  2.  Appendectomy.  3. Tonsillectomy.  4. Bilateral carotid endarterectomy in 2002.   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   MEDICATIONS:  As noted from the [place] office include:  1. Aspirin 81 mg a day.  2. Plavix 75 mg a day.  3. Pravachol 20 mg a day.  4. Verapamil 240 mg a day.  5. __________ 500 b.i.d.  6. Cardura 4 mg q.h.s.  7. Ziac 10 mg a day.  8. Cozaar 100 mg a day.  9. ____________ 0.1 mg b.i.d.  10.      Bisoprolol /HCTZ 10/6.25 a day.   ALLERGIES:  PENICILLIN which causes severe swelling, seizures, and airway  close up many years ago.   REVIEW OF SYSTEMS:  CARDIAC:  Positive for chest pain and exertional  shortness of breath, 2-pillow orthopnea, denies  syncope, presyncope,  palpitations, resting shortness of breath, lower extremity edema.  GENERAL:  The patient has had an approximately 20-pound weight loss since November,  purposely.  Denies any fever, chills, or other constitutional symptoms.  Respiratory status is as noted above.  GASTROINTESTINAL:  Denies any bowel  changes or blood in his stool.  NEUROLOGIC:  Has a history of amaurosis in  both eyes prior to his carotid endarterectomy, but none since.  Denies any  changes in musculoskeletal.  GU:  Positive urinary frequency.  Denies any  recent infection.  HEMATOLOGIC:  Has had easy bruisability.  The patient  stopped Plavix at the time of his catheterization but has been continued on  Naprosyn and aspirin. Denies hypothyroidism, denies psychiatric history.  Has peripheral vascular disease with claudication in both hips, in both  legs.   PHYSICAL EXAMINATION:  VITAL SIGNS:  The patient's blood pressure is 122/72;  pulse 84 and regular.  Respiratory rate is 18.  Height is 6 feet tall, 234  pounds.  The patient is right-handed.  GENERAL:  The patient is awake and alert, neurologically intact.  HEENT:  Pupils are equal and round and reacted to light.   NECK:  Without carotid bruits. He has bilateral incisions of carotid  endarterectomy.  LUNGS:  He has distant breath sounds, but without active wheezing.  ABDOMEN:  Moderately obese abdomen without palpable masses or tenderness.  EXTREMITIES:  He has 2+ DP and PT pulses bilaterally.  He appears to have  adequate vein in both ankles from bypass.  NEUROLOGIC:  Exam is grossly intact.  LYMPH NODES:  He has no cervical or supraclavicular lymph nodes.  SKIN:  He has very slight venous stasis changes in the lower extremities  without varicosities.   The patient underwent Cardiolite stress test with estimated ejection  fraction of 0.48 with decreased attenuation in the midanterior wall.  This  lead to cardiac catheterization by Dr. [name] and also intravascular  ultrasound of the LAD and first diagonal.  Overall he had an ejection  fraction of 55% with normal end-diastolic pressures.  LAD is a medium size  vessel, but has 2 large diagonal branches and the LAD is calcified in the  proximal section.  There is 70% proximal disease with narrowing,  approximately 80% between 2 proximal diagonal branches. The first diagonal  has stenosis of at least 80%.  The circumflex dominant provides several  small marginal branches.  The right coronary artery is small, nondominant.  The left renal artery stent is patent.   After review of the films the patient's symptoms and stress test, I agree  with Dr. Urban Gibson recommendation to proceed with coronary artery bypass  grafting to at least the LAD and possibly one or two diagonal branches and  consider the circumflex.  The risks of surgery including:  Death, infection,  stroke, myocardial infarction, bleeding, blood transfusion all have been  discussed with the patient in detail. The patient's questions have been  answered and he is willing to proceed with surgery.  Tentatively made  arrangements to proceed with bypass surgery on October 25, 2003.  Since  the patient has been off of his Plavix for almost 2 weeks the patient is  agreeable with this approach and we will make arrangements for surgery.  In  the [place] office the patient had carotid Dopplers which demonstrated  approximately 50% stenosis on the right and 40% stenosis on the left  carotid.  Gwenith Daily Tyrone Sage, M.D.   Tyson Babinski  D:  10/24/2003  T:  10/24/2003  Job:  [id number]

## 2011-03-05 NOTE — Op Note (Signed)
Todd Kelly, Todd Kelly                        ACCOUNT NO.:  [id number]   MEDICAL RECORD NO.:  [id number]                   PATIENT TYPE:  INP   LOCATION:  2304                                 FACILITY:  [facility]   PHYSICIAN:  Gwenith Daily. Tyrone Sage, M.D.            DATE OF BIRTH:  November 11, 1930   DATE OF PROCEDURE:  10/25/2003  DATE OF DISCHARGE:                                 OPERATIVE REPORT   PREOPERATIVE DIAGNOSIS:  Coronary occlusive disease and 1.5 cm skin lesion  in the midline of the upper chest.   POSTOPERATIVE DIAGNOSIS:  Coronary occlusive disease and 1.5 cm skin lesion  in the midline of the upper chest.   SURGICAL PROCEDURE:  Coronary artery bypass grafting x 3 with a left  internal mammary artery to left anterior descending coronary artery, reverse  saphenous vein graft to the first intermediate coronary artery, reverse  saphenous vein graft to the second intermediate coronary artery with  Endovein harvesting.  Also excision of 1.5 x 1 cm full-thickness skin  lesion, midline upper chest.   SURGEON:  Gwenith Daily. Tyrone Sage, M.D.   FIRST ASSISTANT:  Dr. Cornelius Moras.   SECOND ASSISTANT:  Gershon Crane, P.A.   BRIEF HISTORY:  The patient is a 77 year old male who had known coronary  occlusive disease, having been catheterized two years previously with LAD  and intermediate disease.  He has a small nondominant right coronary system.  He also has known peripheral vascular disease and cerebral vascular disease  with previous bilateral carotid endarterectomies.  Because of increasing  chest discomfort associated with shortness of breath, usually exertionally  related, the patient underwent repeat evaluation by Dr. [name] including  cardiac catheterization and IVUS, which demonstrated significant progression  of disease in the proximal LAD of greater than 70%.  In addition, the first  intermediate and second intermediate, which were the primary suppliers of  the lateral wall of the heart, both  had significant disease, the first  greater than 80-90% and the second of at least 80%.  The patient had  irregularities throughout the circumflex system, which supplied several  small posterolateral and posterior descending branches.  Overall the  ejection fraction was slightly decreased.   The patient agreed to bypass surgery and signed informed consent after  discussing the risks and options of treatment.   DESCRIPTION OF PROCEDURE:  With Swan-Ganz arterial line and monitors in  place, the patient underwent general endotracheal anesthesia without  incidence.  The chest and legs were prepped with Betadine and draped in the  usual sterile manner.  Using the [employer] Endovein harvesting system, vein  was harvested from the right thigh and was of adequate quality and caliber.  Median sternotomy was performed.  The left internal mammary artery was  dissected down its pedicle graft.  The distal artery was divided and had  good free flow.  Pericardium was opened.  Overall ventricular function  appeared preserved.  Consideration for  bypass off pump had been entertained;  however, both of the intermediate vessels were intramyocardial and it was  not felt that the could safely be bypassed high on the lateral wall and  intramyocardial off pump.   The patient was systemically heparinized.  The ascending aorta and right  atrium were cannulated and the aortic root vent and cardioplegia needle was  introduced into the ascending aorta.  The patient was placed on  cardiopulmonary bypass 2.4 L per minute per m2.  The sites of anastomosis  were selected and dissected out of the epicardium.  The patient's body  temperature was cooled to 30 degrees.  Aortic cross-clamp was applied; 500  cc of cold blood potassium cardioplegia was administered with rapid  diastolic arrest of the heart.  Myocardial septal temperatures were  monitored throughout the cross-clamp.   Attention was turned first to the first  intermediate coronary artery, which  was intramyocardial.  The vessel was opened and admitted a 1.5 mm probe;  using running 7-0 Prolene, distal anastomosis was performed.  Attention was  then turned to the second intermediate coronary artery, which was also  partially intramyocardial.  It was dissected out, was opened, and was  slightly smaller but admitted a 1.5 mm probe; using a running 7-0 Prolene,  distal anastomosis was performed.  Attention was then turned to the left  anterior descending coronary artery, which was opened in the mid portion of  the vessel and admitted a 1.5 mm probe; using running 8-0 Prolene, left  internal mammary artery was anastomosed to the left anterior descending  coronary artery with release of the Edwards bulldog on the mammary artery  there was appropriate rise in myocardial septal temperature.  Aortic cross-  clamp was removed.  Total cross-clamp time was 47 minutes.   The patient spontaneously converted to a sinus rhythm but was bradycardic;  ultimately required atrial pacing.  Partial occlusion clamp was placed on  the ascending aorta.  Two punch aortotomies were performed.  Each of the two  vein grafts were anastomosed to the ascending aorta.  Air was evacuated and  partial occlusion clamp was removed.  The sites were anastomosed and were  inspected and free of bleeding.   The patient was then ventilated, weaned from cardiopulmonary bypass on low  dose of Dopamine.  He remained hemodynamically stable.  He was decannulated  in the usual fashion.  Protamine sulfate was administered.  With the  operative field hemostatic, two atrial and two ventricular pacing wires were  __________ two mediastinal tubes left in place.  The patient was transferred  to the surgical intensive care unit for further postoperative care.   A separate but concomitant procedure included on the patient's upper chest was a 1.5 x 1 cm exophytic mass in the midline of the upper  chest which  interfered with the incision.  Because of this lesion and the difficulty of  closing this lesion, a full-thickness excision of the 1.5 x 1 cm lesion was  carried out, and then the incision closed primarily with the final wound  closure.  The specimen was sent to pathology for examination.                                               Gwenith Daily Tyrone Sage, M.D.    Tyson Babinski  D:  10/25/2003  T:  10/25/2003  Job:  [id number]   cc:   [employer]

## 2011-03-05 NOTE — Procedures (Signed)
[place]. [facility]  Patient:    KARVER, FADDEN Visit Number: [id number] MRN: [id number]          Service Type: DSU Location: 4700 [id number] Attending Physician:  [name] Dictated by:   [name], M.D. Proc. Date: 07/07/01 Admit Date:  07/07/2001   CC:         Lonzo Cloud. Kriste Basque, M.D. [facility]   Procedure Report  PROCEDURES: 1. Abdominal aortogram. 2. Selective renal angiography. 3. Percutaneous transluminal angioplasty and stenting of the left renal    artery.  DIAGNOSES: 1. Peripheral vascular disease. 2. Renal artery stenosis. 3. Hypertension.  HISTORY:  Mr. Matton is a 77 year old white male with peripheral vascular disease and poorly-controlled hypertension, who has known 70% left renal artery by previous angiogram.  Patient has had poorly-controlled hypertension and has been intolerant to several medications.  He presents now for possible percutaneous intervention.  DESCRIPTION OF PROCEDURE:  Informed consent was obtained.  Patient brought to the catheterization lab.  A 5 French sheath was placed in the right groin. Abdominal aortogram was then performed using pigtail catheter and power injections of contrast.  Selective renal angiography was then performed using diagnostic JR4 catheter.  Initial findings are as follows: 1. Abdominal aorta was of normal caliber with moderate atheromatous buildup.    No critical stenosis or dilatation. 2. Right renal artery is single and patent with mild disease of 20-30%. 3. Left renal artery, large-caliber vessel with high-grade narrowing of 70-80%    in the proximal segment, transstenotic gradient is 80 mmHg.  With these findings, we elected to proceed with percutaneous intervention to the left renal artery.  The right groin sheath was exchanged for a 7 [place] sheath and a 7 [place] JR4 short guide catheter used to engage the left renal artery.  The patient was given heparin intravenously to maintain  an ACT at approximately 250 seconds.  A 0.014 inch stabilizer wire was then advanced into the distal left renal artery and a 4 x 2 Aviator balloon used to predilate the lesion at 10 atmospheres for 30 seconds.  This showed the vessel to be a large-caliber vessel of approximately 8 mm in diameter.  An 8 x 2 Powerflex balloon was then introduced with a P204 stent and crimped; however, this was difficult to advance through the 7 [place] system, which was then upsized for an 8 [place] system.  The P204 was then carefully positioned in the proximal left renal artery, extending up to the ostium, and deployed at 10 atmospheres for 30 seconds.  The stent delivery balloon was brought back slightly and used to further dilate the proximal segment of the stent at 12 atmospheres for 60 seconds.  Repeat angiography was then performed after the administration of 200 mcg of nitroglycerin, showing an excellent result with no residual stenosis, full coverage of the lesion, and no evidence of vessel damage.  Repeat gradient was measured, and this was now 0 mmHg.  The guide catheter was then removed, the sheath secured in position.  The patient was transferred to the holding area.  He tolerated the procedure well.  Hemodynamics:  Central pressure was 170/80, while cuff pressure in the left arm was 144/84 and in the right arm was 158/85.  FINAL RESULT:  Successful PTA and stenting of the proximal left renal artery with reduction of 70-80% narrowing to 0% with placement of a P204 stent dilated to 8 mm, with reduction of 80% transstenotic gradient to 0 mmHg. Dictated by:  [name], M.D. Attending Physician:  [name] DD:  07/07/01 TD:  07/07/01 Job: [id number]

## 2011-03-05 NOTE — Discharge Summary (Signed)
[place]. [facility]  Patient:    Todd Kelly, Todd Kelly Visit Number: [id number] MRN: [id number]          Service Type: Attending:  [name], M.D. Dictated by:   Rozell Searing, P.A. Adm. Date:  07/07/01 Disc. Date: 07/08/01                    Referring Physician Discharge [place]  PROCEDURES:  Stent, left renal artery - July 07, 2001.  REASON FOR ADMISSION:  Please refer to dictated admission note for full details.  LABORATORY DATA:  Sodium 140, potassium 3.7, glucose 101, BUN 17, creatinine 1.0 at discharge.  Admission CXR:  Cardiac enlargement; no frank congestive failure.  HOSPITAL COURSE:  Patient was admitted for elective peripheral angiography and possible intervention.  Procedure performed by Dr. [name] on September 20 (see report for full details).  Angiography revealed a 70% left renal artery stenosis which was successfully stented to 0% residual stenosis but no noted complications.  Patient was placed on hydration therapy and kept for overnight observation. Follow-up BMP in the morning revealed normal renal function.  Medication adjustments notable for up titration of Cozaar for better management of blood pressure.  Postoperative course notable for development of moderate sized hematoma with loud bruit on exam.  A duplex scan of the right groin was ordered to exclude pseudoaneurysm or AV fistula.  Plans were to discharge patient home if the ultrasound revealed no significant abnormality.  Recommendation by Dr. [name] is to maintain patient on Plavix indefinately.  MEDICATIONS AT DISCHARGE: 1. Cozaar 100 mg q.d. 2. Altace 10 mg b.i.d. 3. Aspirin 81 mg q.d. 4. Plavix 75 mg q.d. 5. Pravachol 20 mg q.d. 6. Verapamil 240 mg q.d. 7. Naprosyn 500 mg b.i.d. 8. Cardura 4 mg q.d. 9. Ziac 10 mg q.d.  INSTRUCTIONS:  ACTIVITY:  No heavy lifting/driving x 1 week.  DIET:  Maintain low fat/cholesterol diet.  WOUND CARE:  Call the office if  there is any swelling/bleeding of the groin.  FOLLOW-UP:  Patient is instructed to return to [facility] for a groin check in one week.  We will then schedule him for follow-up with Dr. [name] in 4-6 weeks.  DISCHARGE DIAGNOSES: 1. Peripheral vascular disease.    a. Status post stent 70% left renal artery - July 07, 2001.    b. Right groin hematoma. 2. History of nonobstructive coronary artery disease/normal left ventricular    function. 3. Hypertension. 4. Dislipidemia. Dictated by:   Rozell Searing, P.A. Attending:  [name], M.D. DD:  07/08/01 TD:  07/08/01 Job: [id number]

## 2011-04-09 ENCOUNTER — Encounter: Payer: Self-pay | Admitting: Pulmonary Disease

## 2011-04-12 ENCOUNTER — Encounter: Payer: Self-pay | Admitting: Pulmonary Disease

## 2011-04-12 ENCOUNTER — Ambulatory Visit (INDEPENDENT_AMBULATORY_CARE_PROVIDER_SITE_OTHER): Payer: 59 | Admitting: Pulmonary Disease

## 2011-04-12 DIAGNOSIS — E669 Obesity, unspecified: Secondary | ICD-10-CM

## 2011-04-12 DIAGNOSIS — G4733 Obstructive sleep apnea (adult) (pediatric): Secondary | ICD-10-CM

## 2011-04-12 DIAGNOSIS — E7800 Pure hypercholesterolemia, unspecified: Secondary | ICD-10-CM

## 2011-04-12 DIAGNOSIS — J4489 Other specified chronic obstructive pulmonary disease: Secondary | ICD-10-CM

## 2011-04-12 DIAGNOSIS — F411 Generalized anxiety disorder: Secondary | ICD-10-CM

## 2011-04-12 DIAGNOSIS — I4891 Unspecified atrial fibrillation: Secondary | ICD-10-CM

## 2011-04-12 DIAGNOSIS — I2510 Atherosclerotic heart disease of native coronary artery without angina pectoris: Secondary | ICD-10-CM

## 2011-04-12 DIAGNOSIS — Z950 Presence of cardiac pacemaker: Secondary | ICD-10-CM

## 2011-04-12 DIAGNOSIS — J441 Chronic obstructive pulmonary disease with (acute) exacerbation: Secondary | ICD-10-CM

## 2011-04-12 DIAGNOSIS — J449 Chronic obstructive pulmonary disease, unspecified: Secondary | ICD-10-CM

## 2011-04-12 DIAGNOSIS — I679 Cerebrovascular disease, unspecified: Secondary | ICD-10-CM

## 2011-04-12 DIAGNOSIS — M1990 Unspecified osteoarthritis, unspecified site: Secondary | ICD-10-CM

## 2011-04-12 MED ORDER — ALPRAZOLAM 0.5 MG PO TABS: 0.5000 mg | ORAL_TABLET | Freq: Three times a day (TID) | ORAL | Status: DC | PRN

## 2011-04-12 MED ORDER — HYDROCODONE-ACETAMINOPHEN 5-500 MG PO TABS: ORAL_TABLET | ORAL | Status: DC

## 2011-04-12 MED ORDER — PREDNISONE 10 MG PO TABS: ORAL_TABLET | ORAL | Status: DC

## 2011-04-12 NOTE — Patient Instructions (Signed)
Today we updated your med list in EPIC>    We decided to try to decr the Prednisone from 10mg  every day to 10mg  alternating w/ 5mg  every other day...    We also refilled your Xanax & Vicodin per request...  We reviewed your prev CXR, Labs from 12/11, and the Cardiology note from DrSolomon 4/12...  Let's get on track w/ our diet 7 exercise program...    I have every confidence that you can get your weight down w/ a good effort!!!  Call for any questions...  Let's plan another follow up visit in 3-4 months, sooner if needed for problems.Marland KitchenMarland Kitchen

## 2011-04-12 NOTE — Progress Notes (Signed)
Subjective:    Patient ID: Todd Kelly, male    DOB: Mar 03, 1931, 77 y.o.   MRN: [id number]  HPI 77 y/o WM here for a follow up visit... he has mult med problems including:  COPD/ AB/ ex-smoker;  HBP/ ASHD/ AFib/ Pacer- followed by DrSolomon/ [facility];  Cerebrovasc & peripheral vasc disease;  Hyperlipidemia/ Obesity;  DJD/ LBP;  Anxiety, etc...  ~  October 14, 2010:  41mo ROV- c/o recent dental issues, otherw OK w/ f/u DrSolomon 10/11- he had CDoppler & Nuclear Stress Test which were OK according to the pt (we don't have these reports)... weight remains 252# (no change);  BP controlled on meds;  denies CP, palpit edema;  remains in AFib on Coumadin per [facility];  he is on Simva80- & they want him to continue this med & this dose... meds refilled per request.  ~  January 21, 2011:  Add-on due to refractory AB episode> started 2/12 w/ cough, congestion, weak & tired- given Avelox & Dosepak w/ improvement;  Called again 3/12 w/ recurrent symptoms & wanted refill Rx, this time given Pred 20mg /d for longer course;  Presents now w/ same senario> symptoms returned off Rx & now w/ thick beige mucus, cough/ congestion, etc;  Exam is actually clear & CXR is clear (chr changes, no pneumonia);  Sputum is thick, beige & not purulent;  We discussed Rx w/ Mucinex 1200mg  Bid regularly & lots of fluids, plus continuing his regular Symbicort, Spiriva, Singulair, Flonase, and adding Pred 20mg  tapering sched but STAY on this til rov...      He is also c/o decr hearing in left ear> exam shows hard wax on the drum distally, he says he checked w/ ENT Jearld Fenton recently & told OK, I rec that he start using debrox to loosen wax & seek second opinion from DrCrosley for lavage & ?hearing eval...  He is also c/o BOO symptoms & we will arrange for Urology eval (he has seen DrPeterson in the past)...  ~  February 11, 2011:  3 week ROV & he reports improved> on the Prednisone 20mg /d & we discussed decr to 10mg /d and stay on this... He saw  DrCrossley in the interim & he said canal was "caked over" & he lavaged the wax out & prescribed some ear drops...  Fringe benefit from the Pred "I don't hurt like I did, my body has made a change for the better", he's more active, etc...  ~  April 12, 2011:  62mo ROV & he has been stable from the resp standpoint> occas cough, thick beige sputum, no hemoptysis, no ch in dyspnea, etc;  He's been on Pred 10mg /d & still benefiting from the ortho standpoint as well;  We discussed decr the Pred slowly down to 10mg  alt w/ 5mg  every other day;  Unfortunately he has gained a few lbs to 262# & we discussed diet, exercise, & wt reduction program (he will re-join the [facility])...  He had f[facility]u [facility] 4/12 DrSolomon> CAD s/p CABG 2005, bilat CAEs 2000, s/p pacer 2010 for CHB & PAF; no changes made...         Problem List:  BRONCHITIS, CHRONIC, ACUTE EXACERBATION (ICD-491.21) >> see prev notes... COPD (ICD-496) - ex-smoker, freq flairs when off Rx... now taking: SYMBICORT 160- 2spBid,  SPIRIVA daily, SINGULAIR 10mg /d,  MUCINEX 1-2 Bid w/ fluids, + FLONASE... ~  baseline CXR shows COPD, prev median sternotomy, NAD.Marland KitchenMarland Kitchen  ~   CT CHEST 4/09 w/ extensive coronary calcif, prior CABG, no signif  abn in the lungs. ~  PFT's 4/09 showed FVC=2.88 (71%), FEV1=2.13 (67%), FEV1/ FVC= 74%, mid-flows= 51%pred... ~  CXR 3/10 showed sl cardiomeg, pleural thickening, pacer, DJD sp, NAD. ~  CXR 12/11 showed s/p CABG, pacer on left, borderline cardiomeg, basilar scarring, NAD. ~  CXR 4/12 showed s/p CABG, pacer, extrapleural fat deposition bilat, NAD...  OBSTRUCTIVE SLEEP APNEA (ICD-327.23) - he does not tolerate his CPAP...  HYPERTENSION (ICD-401.9) controlled on meds:  METOPROLOL 50mg Bid, COZAAR 100/d, CARDIZEM CD 180mg /d, & HCTZ 12.5mg /d...  ~  4/12:  BP= 140/72 and not really checking BP at home as requested... advised to elim sodium, get on deit & get weight down!!! ~  6/12:  BP= 144/82 & wt up 7# further, we discussed diet, exercise, get  wt down...  ARTERIOSCLEROTIC HEART DISEASE (ICD-414.00) - follow by [facility]... he is s/p CABG 1/05... he is on ASA 81mg /d,  now COUMADIN, + meds as listed above... note 2DEcho w/ LVD & EF= 35-40%... ~   CT CHEST 4/09 w/ extensive coronary calcif, prior CABG, no signif abn in the lungs. ~  cath 4/09 showed patent grafts, some disease in Circ & RCA, patent stent to left renal art... med Rx.  ~  2DEcho 5/11 showed mod conc LVH, mod reduced LVF w/ EF= 35-40% w/ apical AK, dil RA... ~  pt reports Nuclear Stress Test after 10/11 OV w/ DrSolomon was OK.  ATRIAL FIBRILLATION (ICD-427.31) CARDIAC PACEMAKER IN SITU (ICD-V45.01) - he had syncope w/ 4 fx right ribs and CHB & AFib requiring pacer & Coumadin started... [facility] DrBerry... f/u pacer checks OK & no changes made...  CEREBROVASCULAR DISEASE (ICD-437.9) - S/P Bilat CAE's 2000 & doppler's are followed by DrBerry...  ~  last CDoppler reported by [facility] 4/09 showed mod distal right CCA stenosis. ~  pt reports CDopplers done after 10/11 OV w/ DrSolomon was OK.  PERIPHERAL VASCULAR DISEASE (ICD-443.9) - S/P Rt fem art thrombectomy 2000, and Rt renal art stent 2002... he has all this followed by [facility] now & last doppler & cath showed patent stent.  HYPERCHOLESTEROLEMIA (ICD-272.0) - controlled on SIMVASTATIN 80mg /d & Fish Oil and labs followed by DrBerry (pt will ask for copies to be sent to [place])... he recently added NIASPAN for HDL of 31, but this was stopped due to flushing/ intol...  ~  FLP 12/09 showed TChol 138, TG 220, HDL 34, LDL 60 ~  FLP 12/10 showed TChol 132, TG 253, HDL 33, LDL 61 ~  FLP 12/11 showed TChol 144, TG 200, HDL 33, LDL 71... advised low fat diet & get weight down  OBESITY (ICD-278.00) - weight fluctuates betw 240-250# & he has been unable to lose weight... he is sedentary and we discussed an exercise program for him, since he is limited by LBP. ~  NOTE:  labs 12/09 showed BS= 131, HgA1c= 6.1.[name]. advised low carb diet,  get wt down! ~  6/10: wt today= 249#, but pt states down to 233# at home- "just decr portions" ~  12/10:  wt today= 252# post holiday... he states "I'm starving myself to death"... ~  04/25/23:  weight = 252#.[name]. states he's lost 4" at the waist... ~  12/11:  weight = 252# ~  4/12:  Weight = 256# ~  6/12:  Weight = 262#  DIVERTICULOSIS OF COLON (ICD-562.10) COLONIC POLYPS (ICD-211.3) HEMORRHOIDS (ICD-455.6) ~  last colonoscopy was 12/07 by DrStark and showed divertic, polyps 2-22mm size (ademomas), and hems... f/u planned 36yrs.  DEGENERATIVE JOINT DISEASE (ICD-715.90) LOW BACK PAIN, CHRONIC (ICD-724.2) ~  he uses DCN 100 for pain and prev requested refill of #360 for 3 month supply... ~  12/10: DCN now off the market & Rx for Vicodin #270- up to 3/d as needed for pain.  ANXIETY (ICD-300.00) - he uses ALPRAZOLAM 0.5mg  Tid and prev requested #270 for 3 month supply...   Past Surgical History  Procedure Date  . Pta to right common femoral artery 2001    Dr. [name]  . Carotid endarterectomy 2002    bilateral.  Dr. Hart [place]  . Coronary artery bypass graft 2005  . Pacemaker placement July 2009    for CHB  Dr. Allyson Sabal.    Outpatient Encounter Prescriptions as of 04/12/2011  Medication Sig Dispense Refill  . ALPRAZolam (XANAX) 0.5 MG tablet Take 0.5 mg by mouth 3 (three) times daily as needed. Not to exceed 3 per day.       [name] aspirin 81 MG tablet Take 81 mg by mouth daily.        . budesonide-formoterol (SYMBICORT) 160-4.5 MCG/ACT inhaler Inhale 2 puffs into the lungs 2 (two) times daily.        . Coenzyme Q10 (COQ10) 100 MG CAPS Take 1 capsule by mouth daily.        [name] diltiazem (CARDIZEM CD) 180 MG 24 hr capsule Take 180 mg by mouth daily.        [name] docusate sodium (COLACE) 100 MG capsule Take 2 tabs by mouth once daily       . fluticasone (FLONASE) 50 MCG/ACT nasal spray 1 spray by Nasal route 2 (two) times daily.        [name] guaiFENesin (MUCINEX) 600 MG 12 hr tablet Take 1 to 2 tabs by mouth two  times a day with fluids for thick phlegm       . hydrochlorothiazide (,MICROZIDE/HYDRODIURIL,) 12.5 MG capsule Take 12.5 mg by mouth daily.        [name] HYDROcodone-acetaminophen (VICODIN) 5-500 MG per tablet Take 1 tab by mouth every 8 to 12 hours as needed for pain. Not to exceed 3 per day.       . hydrocortisone (PROCTOSOL HC) 2.5 % rectal cream as directed.        [name] losartan (COZAAR) 100 MG tablet Take 100 mg by mouth daily.        . meclizine (ANTIVERT) 25 MG tablet Take 1/2 to 1 tab by mouth every 6 hours as needed for dizziness.       . metoprolol (LOPRESSOR) 50 MG tablet Take 50 mg by mouth 2 (two) times daily.        . Omega-3 Fatty Acids (FISH OIL) 1000 MG CAPS Take 1 capsule by mouth daily.        [name] omeprazole (PRILOSEC OTC) 20 MG tablet Take 20 mg by mouth daily.        . polyethylene glycol (MIRALAX / GLYCOLAX) packet Take 17 g by mouth daily.        . predniSONE (DELTASONE) 20 MG tablet Take as directed       . simvastatin (ZOCOR) 80 MG tablet Take 80 mg by mouth at bedtime.        [name] tiotropium (SPIRIVA) 18 MCG inhalation capsule Place 18 mcg into inhaler and inhale daily.        [name] warfarin (COUMADIN) 5 MG tablet Take as directed by Dr. Allyson Sabal       . montelukast (SINGULAIR) 10 MG tablet Take  10 mg by mouth at bedtime.        [name] DISCONTD: moxifloxacin (AVELOX) 400 MG tablet Take 400 mg by mouth daily. Until gone.         Allergies  Allergen Reactions  . Penicillins     REACTION: Allergic to PCN w/ throat swelling \\T \ hives    Review of Systems       See HPI - all other systems neg except as noted...      The patient complains of dyspnea on exertion.  The patient denies anorexia, fever, weight loss, weight gain, vision loss, decreased hearing, hoarseness, chest pain, syncope, peripheral edema, prolonged cough, headaches, hemoptysis, abdominal pain, melena, hematochezia, severe indigestion/heartburn, hematuria, incontinence, muscle weakness, suspicious skin lesions, transient  blindness, difficulty walking, depression, unusual weight change, abnormal bleeding, enlarged lymph nodes, and angioedema.     Objective:   Physical Exam     WD, Obese, 78 y/o WM in NAD... GENERAL:  Alert & oriented; pleasant & cooperative... HEENT:  [place]/AT, EOM-full, PERRLA, TMs- distal wax blocking left TM, NOSE-clear, THROAT- sl red w/o exud... NECK:  Supple w/ fair ROM; no JVD; s/p bilat CAE's w/ bruits; no thyromegaly or nodules palpated; no lymphadenopathy. CHEST:  decr BS bilat w/ few scat rhonchi, no rales, no signs of consolidation... HEART:  Regular Rhythm; without murmurs/ rubs/ or gallops- median sternotomy scar...pacemaker in place ABDOMEN:  Obese, soft & nontender; normal bowel sounds; no organomegaly or masses detected. EXT: warm and dry, mild arthritic changes; no varicose veins/ +venous insuffic/ no edema. NEURO:  CN's intact; no focal neuro deficits...  DERM: no lesions seen, no rash etc...   Assessment & Plan:   COPD/ Chr Bronchitis>  Improved w/ the Pred & stable now therefore try to wean further> decr to 10mg  alt w/ 5mg  Qod...  HBP>  controllled on meds, continue same...  ASHD/ AFib/ Pacer>  Followed by DrBerry/ Lynnea Ferrier [facility] & stable...  CHOL>  Managed by [employer] he says on Simva80...  Obesity>  Still hasn't been able to lose any wt; we reviewed diet, exercise, wt reduction program...  DJD>  Feeling better w/ the Pred...  Anxiety>  Stable on alpraz Prn.Marland KitchenMarland Kitchen

## 2011-04-18 ENCOUNTER — Encounter: Payer: Self-pay | Admitting: Pulmonary Disease

## 2011-07-13 LAB — LIPID PANEL
Cholesterol: 125
HDL: 38 — ABNORMAL LOW
LDL Cholesterol: 66
Total CHOL/HDL Ratio: 3.3
VLDL: 21

## 2011-07-13 LAB — BASIC METABOLIC PANEL
CO2: 22
CO2: 27
Calcium: 8.8
Chloride: 101
Creatinine, Ser: 1.09
GFR calc Af Amer: >60
GFR calc non Af Amer: >60
GFR calc non Af Amer: >60
Glucose, Bld: 115 — ABNORMAL HIGH
Glucose, Bld: 147 — ABNORMAL HIGH
Potassium: 4
Sodium: 135
Sodium: 139

## 2011-07-13 LAB — CBC
Hemoglobin: 15
Hemoglobin: 16
MCHC: 34.1
MCHC: 34.1
MCV: 95.5
MCV: 95.8
RBC: 4.58
RBC: 4.91
RDW: 13.7
WBC: 12.1 — ABNORMAL HIGH

## 2011-07-13 LAB — DIFFERENTIAL
Eosinophils Absolute: 0
Lymphocytes Relative: 16
Lymphs Abs: 2
Monocytes Relative: 8
Neutro Abs: 9.1 — ABNORMAL HIGH
Neutrophils Relative %: 75

## 2011-07-13 LAB — POCT CARDIAC MARKERS
CKMB, poc: 2.3
Myoglobin, poc: 138
Myoglobin, poc: 74.4
Operator id: 161631
Operator id: 282201

## 2011-07-13 LAB — COMPREHENSIVE METABOLIC PANEL
ALT: 30
BUN: 34 — ABNORMAL HIGH
CO2: 27
Calcium: 9.3
Creatinine, Ser: 1.55 — ABNORMAL HIGH
GFR calc non Af Amer: 44 — ABNORMAL LOW
Glucose, Bld: 104 — ABNORMAL HIGH
Sodium: 134 — ABNORMAL LOW
Total Protein: 6.6

## 2011-07-13 LAB — CK TOTAL AND CKMB (NOT AT ARMC): Relative Index: INVALID

## 2011-07-13 LAB — TROPONIN I: Troponin I: 0.02

## 2011-07-13 LAB — D-DIMER, QUANTITATIVE: D-Dimer, Quant: 0.48

## 2011-07-13 LAB — MAGNESIUM: Magnesium: 2.3

## 2011-07-13 LAB — CARDIAC PANEL(CRET KIN+CKTOT+MB+TROPI): Relative Index: INVALID

## 2011-07-15 LAB — CARDIAC PANEL(CRET KIN+CKTOT+MB+TROPI)
CK, MB: 2.7
Relative Index: 1.6
Relative Index: 2.5
Troponin I: 0.02

## 2011-07-15 LAB — BASIC METABOLIC PANEL
BUN: 14
BUN: 21
BUN: 25 — ABNORMAL HIGH
BUN: 31 — ABNORMAL HIGH
BUN: 19
CO2: 27
CO2: 29
CO2: 28
Calcium: 8.2 — ABNORMAL LOW
Calcium: 8.5
Calcium: 8.6
Chloride: 103
Chloride: 103
Chloride: 107
Chloride: 109
Creatinine, Ser: 0.77
Creatinine, Ser: 0.85
Creatinine, Ser: 0.75
Creatinine, Ser: 0.76
GFR calc Af Amer: >60
GFR calc Af Amer: >60
GFR calc Af Amer: >60
GFR calc Af Amer: >60
GFR calc Af Amer: >60
GFR calc non Af Amer: >60
GFR calc non Af Amer: >60
GFR calc non Af Amer: >60
GFR calc non Af Amer: >60
Glucose, Bld: 108 — ABNORMAL HIGH
Glucose, Bld: 115 — ABNORMAL HIGH
Potassium: 4.2
Potassium: 4.2
Potassium: 4.3
Potassium: 3.6
Sodium: 141

## 2011-07-15 LAB — PROTIME-INR
INR: 1
INR: 1
Prothrombin Time: 13
Prothrombin Time: 13.2

## 2011-07-15 LAB — CBC
HCT: 41.7
HCT: 41.9
HCT: 46.5
HCT: 37.6 — ABNORMAL LOW
Hemoglobin: 15.7
Hemoglobin: 13.3
MCHC: 33.8
MCHC: 34.5
MCHC: 34.6
MCHC: 34.7
MCV: 96.7
MCV: 97
MCV: 97.6
MCV: 97.9
Platelets: 114 — ABNORMAL LOW
Platelets: 142 — ABNORMAL LOW
Platelets: 138 — ABNORMAL LOW
RBC: 4.3
RBC: 4.34
RBC: 4.03 — ABNORMAL LOW
RBC: 4.07 — ABNORMAL LOW
RDW: 13
RDW: 13.1
WBC: 13.4 — ABNORMAL HIGH
WBC: 15.1 — ABNORMAL HIGH
WBC: 8.6

## 2011-07-15 LAB — COMPREHENSIVE METABOLIC PANEL
Albumin: 4
Alkaline Phosphatase: 64
BUN: 21
CO2: 26
Chloride: 104
Potassium: 4.5
Total Bilirubin: 0.6

## 2011-07-15 LAB — MAGNESIUM: Magnesium: 2.1

## 2011-07-15 LAB — APTT: aPTT: 24

## 2011-07-15 LAB — CK TOTAL AND CKMB (NOT AT ARMC): CK, MB: 2.5

## 2011-07-15 LAB — DIFFERENTIAL
Basophils Absolute: 0
Basophils Relative: 0
Eosinophils Relative: 0
Monocytes Absolute: 0.4
Neutro Abs: 13.6 — ABNORMAL HIGH

## 2011-07-15 LAB — B-NATRIURETIC PEPTIDE (CONVERTED LAB): Pro B Natriuretic peptide (BNP): 100

## 2011-07-16 LAB — CBC
HCT: 42.1
HCT: 42.8
HCT: 45.2
HCT: 47.2
Hemoglobin: 15
Hemoglobin: 15.4
MCHC: 33.5
MCHC: 34
MCHC: 34.2
MCV: 94.7
MCV: 94.8
MCV: 94.9
MCV: 95.3
Platelets: 115 — ABNORMAL LOW
Platelets: 133 — ABNORMAL LOW
Platelets: 143 — ABNORMAL LOW
RBC: 4.88
RBC: 4.98
RDW: 12.9
RDW: 13
RDW: 13.1
WBC: 12.8 — ABNORMAL HIGH
WBC: 9.3
WBC: 9.4
WBC: 9.9

## 2011-07-16 LAB — BASIC METABOLIC PANEL
BUN: 20
BUN: 21
CO2: 31
CO2: 31
CO2: 32
Calcium: 8.5
Calcium: 8.7
Calcium: 8.9
Chloride: 100
Chloride: 100
Chloride: 102
Chloride: 99
Creatinine, Ser: 0.84
Creatinine, Ser: 0.89
Creatinine, Ser: 0.93
Creatinine, Ser: 1.01
GFR calc Af Amer: >60
GFR calc Af Amer: >60
GFR calc Af Amer: >60
GFR calc Af Amer: >60
GFR calc Af Amer: >60
GFR calc non Af Amer: >60
GFR calc non Af Amer: >60
Glucose, Bld: 137 — ABNORMAL HIGH
Glucose, Bld: 156 — ABNORMAL HIGH
Potassium: 3.8
Potassium: 4
Potassium: 4.2
Sodium: 136
Sodium: 138
Sodium: 139

## 2011-07-16 LAB — CARDIAC PANEL(CRET KIN+CKTOT+MB+TROPI)
CK, MB: 2.3
Relative Index: INVALID
Total CK: 35
Total CK: 53

## 2011-07-16 LAB — APTT
aPTT: 29
aPTT: 30

## 2011-07-16 LAB — URINALYSIS, ROUTINE W REFLEX MICROSCOPIC
Nitrite: NEGATIVE
Protein, ur: NEGATIVE
Specific Gravity, Urine: 1.016
Urobilinogen, UA: 0.2

## 2011-07-16 LAB — DIFFERENTIAL
Eosinophils Absolute: 0.1
Lymphs Abs: 2.3
Monocytes Relative: 6
Neutrophils Relative %: 75

## 2011-07-16 LAB — POCT CARDIAC MARKERS
CKMB, poc: 1.5
Myoglobin, poc: 49.4
Troponin i, poc: <0.05

## 2011-07-16 LAB — PROTIME-INR
INR: 2 — ABNORMAL HIGH
INR: 2 — ABNORMAL HIGH
INR: 2.1 — ABNORMAL HIGH
INR: 2.2 — ABNORMAL HIGH
Prothrombin Time: 22.1 — ABNORMAL HIGH
Prothrombin Time: 24.3 — ABNORMAL HIGH
Prothrombin Time: 24.5 — ABNORMAL HIGH
Prothrombin Time: 26.7 — ABNORMAL HIGH
Prothrombin Time: 27 — ABNORMAL HIGH

## 2011-07-16 LAB — URINE MICROSCOPIC-ADD ON

## 2011-07-16 LAB — D-DIMER, QUANTITATIVE: D-Dimer, Quant: 1.69 — ABNORMAL HIGH

## 2011-07-16 LAB — MAGNESIUM: Magnesium: 2.3

## 2011-07-16 LAB — CK TOTAL AND CKMB (NOT AT ARMC): CK, MB: 1.9

## 2011-08-02 ENCOUNTER — Encounter: Payer: Self-pay | Admitting: Pulmonary Disease

## 2011-08-02 ENCOUNTER — Ambulatory Visit (INDEPENDENT_AMBULATORY_CARE_PROVIDER_SITE_OTHER): Payer: 59 | Admitting: Pulmonary Disease

## 2011-08-02 DIAGNOSIS — I679 Cerebrovascular disease, unspecified: Secondary | ICD-10-CM

## 2011-08-02 DIAGNOSIS — K5730 Diverticulosis of large intestine without perforation or abscess without bleeding: Secondary | ICD-10-CM

## 2011-08-02 DIAGNOSIS — Z23 Encounter for immunization: Secondary | ICD-10-CM

## 2011-08-02 DIAGNOSIS — E669 Obesity, unspecified: Secondary | ICD-10-CM

## 2011-08-02 DIAGNOSIS — I2510 Atherosclerotic heart disease of native coronary artery without angina pectoris: Secondary | ICD-10-CM

## 2011-08-02 DIAGNOSIS — I739 Peripheral vascular disease, unspecified: Secondary | ICD-10-CM

## 2011-08-02 DIAGNOSIS — F411 Generalized anxiety disorder: Secondary | ICD-10-CM

## 2011-08-02 DIAGNOSIS — M545 Low back pain: Secondary | ICD-10-CM

## 2011-08-02 DIAGNOSIS — M1990 Unspecified osteoarthritis, unspecified site: Secondary | ICD-10-CM

## 2011-08-02 DIAGNOSIS — E7800 Pure hypercholesterolemia, unspecified: Secondary | ICD-10-CM

## 2011-08-02 DIAGNOSIS — D126 Benign neoplasm of colon, unspecified: Secondary | ICD-10-CM

## 2011-08-02 DIAGNOSIS — I10 Essential (primary) hypertension: Secondary | ICD-10-CM

## 2011-08-02 DIAGNOSIS — J449 Chronic obstructive pulmonary disease, unspecified: Secondary | ICD-10-CM

## 2011-08-02 DIAGNOSIS — I4891 Unspecified atrial fibrillation: Secondary | ICD-10-CM

## 2011-08-02 NOTE — Patient Instructions (Signed)
Today we updated your med list in our EPIC system...    Continue your current meds the same...  We reviewed your previous CXR & blood work, & we will plan to recheck these w/ your first check of 2013...  Let's get on track w/ our diet 7 exercise program...    The goal is to lose 15-20 lbs!!!  Call for any questions...  Let's plan a follow up visit after the first of the year.Marland KitchenMarland Kitchen

## 2011-08-02 NOTE — Progress Notes (Signed)
Subjective:    Patient ID: Todd Kelly, male    DOB: 10/23/1930, 77 y.o.   MRN: [id number]  HPI 77 y/o WM here for a follow up visit... he has mult med problems including:  COPD/ AB/ ex-smoker;  HBP/ ASHD/ AFib/ Pacer- followed by DrSolomon/ [facility];  Cerebrovasc & peripheral vasc disease;  Hyperlipidemia/ Obesity;  DJD/ LBP;  Anxiety, etc...  ~  October 14, 2010:  19mo ROV- c/o recent dental issues, otherw OK w/ f/u DrSolomon 10/11- he had CDoppler & Nuclear Stress Test which were OK according to the pt (we don't have these reports)... weight remains 252# (no change);  BP controlled on meds;  denies CP, palpit edema;  remains in AFib on Coumadin per [facility];  he is on Simva80- & they want him to continue this med & this dose... meds refilled per request.  ~  January 21, 2011:  Add-on due to refractory AB episode> started 2/12 w/ cough, congestion, weak & tired- given Avelox & Dosepak w/ improvement;  Called again 3/12 w/ recurrent symptoms & wanted refill Rx, this time given Pred 20mg /d for longer course;  Presents now w/ same senario> symptoms returned off Rx & now w/ thick beige mucus, cough/ congestion, etc;  Exam is actually clear & CXR is clear (chr changes, no pneumonia);  Sputum is thick, beige & not purulent;  We discussed Rx w/ Mucinex 1200mg  Bid regularly & lots of fluids, plus continuing his regular Symbicort, Spiriva, Singulair, Flonase, and adding Pred 20mg  tapering sched but STAY on this til rov...      He is also c/o decr hearing in left ear> exam shows hard wax on the drum distally, he says he checked w/ ENT Jearld Fenton recently & told OK, I rec that he start using debrox to loosen wax & seek second opinion from DrCrosley for lavage & ?hearing eval...  He is also c/o BOO symptoms & we will arrange for Urology eval (he has seen DrPeterson in the past)...  ~  February 11, 2011:  3 week ROV & he reports improved> on the Prednisone 20mg /d & we discussed decr to 10mg /d and stay on this... He saw  DrCrossley in the interim & he said canal was "caked over" & he lavaged the wax out & prescribed some ear drops...  Fringe benefit from the Pred "I don't hurt like I did, my body has made a change for the better", he's more active, etc...  ~  April 12, 2011:  7mo ROV & he has been stable from the resp standpoint> occas cough, thick beige sputum, no hemoptysis, no ch in dyspnea, etc;  He's been on Pred 10mg /d & still benefiting from the ortho standpoint as well;  We discussed decr the Pred slowly down to 10mg  alt w/ 5mg  every other day;  Unfortunately he has gained a few lbs to 262# & we discussed diet, exercise, & wt reduction program ([facility])...  He had f/u [facility] 4/12 DrSolomon> CAD s/p CABG 2005, bilat CAEs 2000, s/p pacer 2010 for CHB & PAF; no changes made...  ~  August 02, 2011:  3-64mo ROV> states he is stable, doing better in the cooler weather, wants FLU shot today...    COPD> on Pred10mg - 1 alt w/ 1/2 qod, Symbicort, Spiriva, Singulair; breathing at baseline= occas wheezing w/ exertion, mild cough, beige sput, DOE w/o change, no edema; [facility]...    HBP> on Lopressor, CardizemCD, Cozaar, HCT; BP= 146/78 &  similar at home; denies CP, palpit, syncope, ch in SOB, edema...    CAD, s/p CABG, Cardiomyopathy w/ EF=35-40%> on ASA + the above; denies angina & only exercise is [facility]" 3d/wk he says...    AFib, Pacer> on Coumadin + the above; Coumadin per [facility] & he has f/u due 11/11 w/ DrC....    Cerebrovasc dis> on ASA & Coumadin as noted; followed by [facility]; pt states last CDoppler was 10/11 & reported "OK"...    ASPVD> on ASA & Coumadin as noted; followed by [facility] w/ prev right fem art thrombectomy & right RA stent...    Chol> on Simva80, FishOil, CoQ10; FLP 4/12 reviewed w/ pt, he is not fasting today & wants to wait for his next OV 1/13...    Obesity> weight= 262# no change; we reviewed diet/ exercise/ wt reduction strategies; he must do better &  he knows it!    GI> Divertics, Polyps, Hems> on Prilosec, Miralax, Colase, AnusolHC; obese protuberant abd, denies pain, N/ V/ D/ C/ blood etc...    DJD, LBP> on Vicodin & averages 1-2 daily...    Anxiety> on Xanax and averages 1/2 tab bid...         Problem List:  BRONCHITIS, CHRONIC, ACUTE EXACERBATION (ICD-491.21) >> see prev notes... COPD (ICD-496) - ex-smoker, freq flairs when off Rx... now taking: SYMBICORT 160- 2spBid,  SPIRIVA daily, SINGULAIR 10mg /d,  MUCINEX 1-2 Bid w/ fluids, + FLONASE... ~  baseline CXR shows COPD, prev median sternotomy, NAD.Marland KitchenMarland Kitchen  ~   CT CHEST 4/09 w/ extensive coronary calcif, prior CABG, no signif abn in the lungs. ~  PFT's 4/09 showed FVC=2.88 (71%), FEV1=2.13 (67%), FEV1/ FVC= 74%, mid-flows= 51%pred... ~  CXR 3/10 showed sl cardiomeg, pleural thickening, pacer, DJD sp, NAD. ~  CXR 12/11 showed s/p CABG, pacer on left, borderline cardiomeg, basilar scarring, NAD. ~  CXR 4/12 showed s/p CABG, pacer, extrapleural fat deposition bilat, NAD...  OBSTRUCTIVE SLEEP APNEA (ICD-327.23) - he does not tolerate his CPAP...  HYPERTENSION (ICD-401.9) controlled on meds:  METOPROLOL 50mg Bid, COZAAR 100/d, CARDIZEM CD 180mg /d, & HCTZ 12.5mg /d; BP control is fair 7 he desperately needs to lose the weight!  ARTERIOSCLEROTIC HEART DISEASE (ICD-414.00) - follow by [facility]... he is s/p CABG 1/05... he is on ASA 81mg /d,  now COUMADIN, + meds as listed above... note 2DEcho w/ LVD & EF= 35-40%... ~   CT CHEST 4/09 w/ extensive coronary calcif, prior CABG, no signif abn in the lungs. ~  cath 4/09 showed patent grafts, some disease in Circ & RCA, patent stent to left renal art... med Rx.  ~  2DEcho 5/11 showed mod conc LVH, mod reduced LVF w/ EF= 35-40% w/ apical AK, dil RA... ~  pt reports Nuclear Stress Test after 10/11 OV w/ DrSolomon was OK.  ATRIAL FIBRILLATION (ICD-427.31) CARDIAC PACEMAKER IN SITU (ICD-V45.01) - he had syncope w/ 4 fx right ribs and CHB & AFib requiring pacer &  Coumadin started... [facility] + DrBerry... f/u pacer checks OK & no changes made...  CEREBROVASCULAR DISEASE (ICD-437.9) - S/P Bilat CAE's 2000 & doppler's are followed by DrBerry...  ~  last CDoppler reported by [facility] 4/09 showed mod distal right CCA stenosis. ~  pt reports CDopplers done after 10/11 OV w/ DrSolomon was OK.  PERIPHERAL VASCULAR DISEASE (ICD-443.9) - S/P Rt fem art thrombectomy 2000, and Rt renal art stent 2002... he has all this followed by [facility] now & last doppler & cath showed patent stent.  HYPERCHOLESTEROLEMIA (ICD-272.0) - controlled on SIMVASTATIN 80mg /d & Fish Oil and labs followed by DrBerry (pt will ask for copies to be sent to [place])... he recently added NIASPAN for HDL of 31, but this was stopped due to flushing/ intol...  ~  FLP 12/09 showed TChol 138, TG 220, HDL 34, LDL 60 ~  FLP 12/10 showed TChol 132, TG 253, HDL 33, LDL 61 ~  FLP 12/11 showed TChol 144, TG 200, HDL 33, LDL 71... advised low fat diet & get weight down  OBESITY (ICD-278.00) - weight fluctuates betw 240-250# & he has been unable to lose weight... he is sedentary and we discussed an exercise program for him, since he is limited by LBP. ~  NOTE:  labs 12/09 showed BS= 131, HgA1c= 6.1.[name]. advised low carb diet, get wt down! ~  6/10: wt today= 249#, but pt states down to 233# at home- "just decr portions" ~  12/10:  wt today= 252# post holiday... he states "I'm starving myself to death"... ~  Apr 19, 2023:  weight = 252#.[name]. states he's lost 4" at the waist... ~  12/11:  weight = 252# ~  4/12:  Weight = 256# ~  6/12:  Weight = 262#  DIVERTICULOSIS OF COLON (ICD-562.10) COLONIC POLYPS (ICD-211.3) HEMORRHOIDS (ICD-455.6) ~  last colonoscopy was 12/07 by DrStark and showed divertic, polyps 2-27mm size (ademomas), and hems... f/u planned 57yrs.  DEGENERATIVE JOINT DISEASE (ICD-715.90) LOW BACK PAIN, CHRONIC (ICD-724.2) ~  he uses DCN 100 for pain and prev requested refill of #360 for 3 month  supply... ~  12/10: DCN now off the market & Rx for Vicodin #270- up to 3/d as needed for pain.  ANXIETY (ICD-300.00) - he uses ALPRAZOLAM 0.5mg  Tid and prev requested #270 for 3 month supply...   Past Surgical History  Procedure Date  . Pta to right common femoral artery 2001    Dr. [name]  . Carotid endarterectomy 2002    bilateral.  Dr. Hart [place]  . Coronary artery bypass graft 2005  . Pacemaker placement July 2009    for CHB  Dr. Allyson Sabal.    Outpatient Encounter Prescriptions as of 08/02/2011  Medication Sig Dispense Refill  . ALPRAZolam (XANAX) 0.5 MG tablet Take 1 tablet (0.5 mg total) by mouth 3 (three) times daily as needed. Not to exceed 3 per day.  270 tablet  1  . aspirin 81 MG tablet Take 81 mg by mouth daily.        . budesonide-formoterol (SYMBICORT) 160-4.5 MCG/ACT inhaler Inhale 2 puffs into the lungs 2 (two) times daily.        . Coenzyme Q10 (COQ10) 100 MG CAPS Take 1 capsule by mouth daily.        [name] diltiazem (CARDIZEM CD) 180 MG 24 hr capsule Take 180 mg by mouth daily.        [name] docusate sodium (COLACE) 100 MG capsule Take 2 tabs by mouth once daily       . fluticasone (FLONASE) 50 MCG/ACT nasal spray 1 spray by Nasal route 2 (two) times daily.        [name] guaiFENesin (MUCINEX) 600 MG 12 hr tablet Take 1 to 2 tabs by mouth two times a day with fluids for thick phlegm       . hydrochlorothiazide (,MICROZIDE/HYDRODIURIL,) 12.5 MG capsule Take 12.5 mg by mouth daily.        [name] HYDROcodone-acetaminophen (VICODIN) 5-500 MG per tablet Take 1 tab by mouth every 8 to 12 hours as needed  for pain. Not to exceed 3 per day.  270 tablet  1  . hydrocortisone (PROCTOSOL HC) 2.5 % rectal cream as directed.        [name] losartan (COZAAR) 100 MG tablet Take 100 mg by mouth daily.        . meclizine (ANTIVERT) 25 MG tablet Take 1/2 to 1 tab by mouth every 6 hours as needed for dizziness.       . metoprolol (LOPRESSOR) 50 MG tablet Take 50 mg by mouth 2 (two) times daily.        . montelukast  (SINGULAIR) 10 MG tablet Take 10 mg by mouth at bedtime.        . Omega-3 Fatty Acids (FISH OIL) 1000 MG CAPS Take 1 capsule by mouth daily.        [name] omeprazole (PRILOSEC OTC) 20 MG tablet Take 20 mg by mouth daily.        . polyethylene glycol (MIRALAX / GLYCOLAX) packet Take 17 g by mouth daily.        . predniSONE (DELTASONE) 10 MG tablet Take as directed  50 tablet  11  . simvastatin (ZOCOR) 80 MG tablet Take 80 mg by mouth at bedtime.        [name] tiotropium (SPIRIVA) 18 MCG inhalation capsule Place 18 mcg into inhaler and inhale daily.        [name] warfarin (COUMADIN) 5 MG tablet Take as directed by Dr. Allyson Sabal         Allergies  Allergen Reactions  . Penicillins     REACTION: Allergic to PCN w/ throat swelling \\T \ hives    Review of Systems       See HPI - all other systems neg except as noted...      The patient complains of dyspnea on exertion.  The patient denies anorexia, fever, weight loss, weight gain, vision loss, decreased hearing, hoarseness, chest pain, syncope, peripheral edema, prolonged cough, headaches, hemoptysis, abdominal pain, melena, hematochezia, severe indigestion/heartburn, hematuria, incontinence, muscle weakness, suspicious skin lesions, transient blindness, difficulty walking, depression, unusual weight change, abnormal bleeding, enlarged lymph nodes, and angioedema.     Objective:   Physical Exam     WD, Obese, 77 y/o WM in NAD... GENERAL:  Alert & oriented; pleasant & cooperative... HEENT:  [place]/AT, EOM-full, PERRLA, TMs- distal wax blocking left TM, NOSE-clear, THROAT- sl red w/o exud... NECK:  Supple w/ fair ROM; no JVD; s/p bilat CAE's w/ bruits; no thyromegaly or nodules palpated; no lymphadenopathy. CHEST:  decr BS bilat w/ few scat rhonchi, no rales, no signs of consolidation... HEART:  Regular Rhythm; without murmurs/ rubs/ or gallops- median sternotomy scar...pacemaker in place ABDOMEN:  Obese, soft & nontender; normal bowel sounds; no organomegaly or  masses detected. EXT: warm and dry, mild arthritic changes; no varicose veins/ +venous insuffic/ no edema. NEURO:  CN's intact; no focal neuro deficits...  DERM: no lesions seen, no rash etc...   Assessment & Plan:   COPD/ Chr Bronchitis>  Improved w/ the Pred at10mg  alt w/ 5mg  Qod; continue this and his other meds: Symbicort, Spiriva, Singulair, Mucinex, etc...  HBP>  Fair controll on meds, continue same for now but he desperately needs to lose weight & I told him his longevity depended on it!!!  ASHD/ AFib/ Pacer>  Followed by DrBerry/ [facility] & stable; on Coumadin via their CC...  Cerebrovasc & Peripheral vasc dis>  Also followed regularly by [facility] (last note 4/12- we don't have their Doppler studies).Marland KitchenMarland Kitchen  CHOL>  He remains on Simva80 from [facility], FLP last done here was 4/12 (elev TG & low HDL- advised low fat diet, weight reduction, incr exercise) ...  Obesity>  Still hasn't been able to lose any wt; we reviewed diet, exercise, wt reduction program...  DJD>  Feeling better w/ the Pred, uses vicodin as needed...  Anxiety>  Stable on Alpraz Prn.Marland KitchenMarland Kitchen

## 2011-08-03 ENCOUNTER — Telehealth: Payer: Self-pay | Admitting: Pulmonary Disease

## 2011-08-03 ENCOUNTER — Encounter: Payer: Self-pay | Admitting: Pulmonary Disease

## 2011-08-03 NOTE — Telephone Encounter (Signed)
Spoke with [facility] and she is aware that SN did not order any labs for this patient when here for OV on 08/02/2011.

## 2011-09-23 ENCOUNTER — Telehealth: Payer: Self-pay | Admitting: Pulmonary Disease

## 2011-09-23 NOTE — Telephone Encounter (Signed)
Pt is requesting to have fasting labs done prior to his 10/18/11 apt. Please advise what labs need to be ordered Dr. Kriste Basque, thanks

## 2011-09-23 NOTE — Telephone Encounter (Signed)
Lab orders placed and spoke with [name] and notified that this was done.

## 2011-09-23 NOTE — Telephone Encounter (Signed)
Per SN---ok for fasting labs prior to ov---lip, bmp,hepat,cbcd,tsh, psa, a1c.  thanks

## 2011-10-05 ENCOUNTER — Ambulatory Visit: Payer: 59

## 2011-10-05 LAB — LIPID PANEL
LDL Cholesterol: 75 mg/dL (ref 0–99)
Total CHOL/HDL Ratio: 4
Triglycerides: 195 mg/dL — ABNORMAL HIGH (ref 0.0–149.0)

## 2011-10-05 LAB — CBC WITH DIFFERENTIAL/PLATELET
Basophils Absolute: 0 10*3/uL (ref 0.0–0.1)
Eosinophils Relative: 0.4 % (ref 0.0–5.0)
Hemoglobin: 16.7 g/dL (ref 13.0–17.0)
Lymphocytes Relative: 24.5 % (ref 12.0–46.0)
Monocytes Relative: 7.1 % (ref 3.0–12.0)
Neutro Abs: 8.4 10*3/uL — ABNORMAL HIGH (ref 1.4–7.7)
RDW: 14.5 % (ref 11.5–14.6)
WBC: 12.4 10*3/uL — ABNORMAL HIGH (ref 4.5–10.5)

## 2011-10-05 LAB — BASIC METABOLIC PANEL
Chloride: 102 mEq/L (ref 96–112)
Creatinine, Ser: 1.3 mg/dL (ref 0.4–1.5)
Potassium: 3.9 mEq/L (ref 3.5–5.1)
Sodium: 146 mEq/L — ABNORMAL HIGH (ref 135–145)

## 2011-10-05 LAB — HEPATIC FUNCTION PANEL
AST: 20 U/L (ref 0–37)
Albumin: 3.9 g/dL (ref 3.5–5.2)
Alkaline Phosphatase: 55 U/L (ref 39–117)
Total Bilirubin: 0.7 mg/dL (ref 0.3–1.2)

## 2011-10-05 LAB — PSA: PSA: 1.77 ng/mL (ref 0.10–4.00)

## 2011-10-05 LAB — TSH: TSH: 1.76 u[IU]/mL (ref 0.35–5.50)

## 2011-10-18 ENCOUNTER — Encounter: Payer: Self-pay | Admitting: Pulmonary Disease

## 2011-10-18 ENCOUNTER — Ambulatory Visit (INDEPENDENT_AMBULATORY_CARE_PROVIDER_SITE_OTHER): Payer: 59 | Admitting: Pulmonary Disease

## 2011-10-18 DIAGNOSIS — I4891 Unspecified atrial fibrillation: Secondary | ICD-10-CM

## 2011-10-18 DIAGNOSIS — I2510 Atherosclerotic heart disease of native coronary artery without angina pectoris: Secondary | ICD-10-CM

## 2011-10-18 DIAGNOSIS — E7800 Pure hypercholesterolemia, unspecified: Secondary | ICD-10-CM

## 2011-10-18 DIAGNOSIS — I679 Cerebrovascular disease, unspecified: Secondary | ICD-10-CM

## 2011-10-18 DIAGNOSIS — I10 Essential (primary) hypertension: Secondary | ICD-10-CM

## 2011-10-18 DIAGNOSIS — M545 Low back pain: Secondary | ICD-10-CM

## 2011-10-18 DIAGNOSIS — F411 Generalized anxiety disorder: Secondary | ICD-10-CM

## 2011-10-18 DIAGNOSIS — I739 Peripheral vascular disease, unspecified: Secondary | ICD-10-CM

## 2011-10-18 DIAGNOSIS — Z950 Presence of cardiac pacemaker: Secondary | ICD-10-CM

## 2011-10-18 DIAGNOSIS — M1990 Unspecified osteoarthritis, unspecified site: Secondary | ICD-10-CM

## 2011-10-18 DIAGNOSIS — E669 Obesity, unspecified: Secondary | ICD-10-CM

## 2011-10-18 DIAGNOSIS — J449 Chronic obstructive pulmonary disease, unspecified: Secondary | ICD-10-CM

## 2011-10-18 MED ORDER — FLUTICASONE PROPIONATE 50 MCG/ACT NA SUSP: 1.0000 | Freq: Two times a day (BID) | NASAL | Status: DC

## 2011-10-18 MED ORDER — PREDNISONE 10 MG PO TABS: ORAL_TABLET | ORAL | Status: DC

## 2011-10-18 MED ORDER — MONTELUKAST SODIUM 10 MG PO TABS: 10.0000 mg | ORAL_TABLET | Freq: Every day | ORAL | Status: DC

## 2011-10-18 MED ORDER — BUDESONIDE-FORMOTEROL FUMARATE 160-4.5 MCG/ACT IN AERO: 2.0000 | INHALATION_SPRAY | Freq: Two times a day (BID) | RESPIRATORY_TRACT | Status: DC

## 2011-10-18 MED ORDER — MECLIZINE HCL 25 MG PO TABS: 25.0000 mg | ORAL_TABLET | Freq: Three times a day (TID) | ORAL | Status: DC | PRN

## 2011-10-18 MED ORDER — HYDROCODONE-ACETAMINOPHEN 5-500 MG PO TABS: ORAL_TABLET | ORAL | Status: DC

## 2011-10-18 MED ORDER — TIOTROPIUM BROMIDE MONOHYDRATE 18 MCG IN CAPS: 18.0000 ug | ORAL_CAPSULE | Freq: Every day | RESPIRATORY_TRACT | Status: DC

## 2011-10-18 MED ORDER — ALPRAZOLAM 0.5 MG PO TABS: 0.5000 mg | ORAL_TABLET | Freq: Three times a day (TID) | ORAL | Status: DC | PRN

## 2011-10-18 NOTE — Progress Notes (Signed)
Subjective:    Patient ID: Todd Kelly, male    DOB: [birth year], 77 y.o.   MRN: [id number]  HPI 77 y/o WM here for a follow up visit... he has mult med problems including:  COPD/ AB/ ex-smoker;  HBP/ ASHD/ AFib/ Pacer- followed by DrSolomon/ [facility];  Cerebrovasc & peripheral vasc disease;  Hyperlipidemia/ Obesity;  DJD/ LBP;  Anxiety, etc...  ~  October 14, 2010:  38mo ROV- c/o recent dental issues, otherw OK w/ f/u DrSolomon 10/11- he had CDoppler & Nuclear Stress Test which were OK according to the pt (we don't have these reports)... weight remains 252# (no change);  BP controlled on meds;  denies CP, palpit edema;  remains in AFib on Coumadin per [facility];  he is on Simva80- & they want him to continue this med & this dose... meds refilled per request.  ~  January 21, 2011:  Add-on due to refractory AB episode> started 2/12 w/ cough, congestion, weak & tired- given Avelox & Dosepak w/ improvement;  Called again 3/12 w/ recurrent symptoms & wanted refill Rx, this time given Pred 20mg /d for longer course;  Presents now w/ same senario> symptoms returned off Rx & now w/ thick beige mucus, cough/ congestion, etc;  Exam is actually clear & CXR is clear (chr changes, no pneumonia);  Sputum is thick, beige & not purulent;  We discussed Rx w/ Mucinex 1200mg  Bid regularly & lots of fluids, plus continuing his regular Symbicort, Spiriva, Singulair, Flonase, and adding Pred 20mg  tapering sched but STAY on this til rov...      He is also c/o decr hearing in left ear> exam shows hard wax on the drum distally, he says he checked w/ ENT Jearld Fenton recently & told OK, I rec that he start using debrox to loosen wax & seek second opinion from DrCrosley for lavage & ?hearing eval...  He is also c/o BOO symptoms & we will arrange for Urology eval (he has seen DrPeterson in the past)...  ~  February 11, 2011:  3 week ROV & he reports improved> on the Prednisone 20mg /d & we discussed decr to 10mg /d and stay on this... He saw  DrCrossley in the interim & he said canal was "caked over" & he lavaged the wax out & prescribed some ear drops...  Fringe benefit from the Pred "I don't hurt like I did, my body has made a change for the better", he's more active, etc...  ~  April 12, 2011:  37mo ROV & he has been stable from the resp standpoint> occas cough, thick beige sputum, no hemoptysis, no ch in dyspnea, etc;  He's been on Pred 10mg /d & still benefiting from the ortho standpoint as well;  We discussed decr the Pred slowly down to 10mg  alt w/ 5mg  every other day;  Unfortunately he has gained a few lbs to 262# & we discussed diet, exercise, & wt reduction program ([facility])...  He had f/u [facility] 4/12 DrSolomon> CAD s/p CABG 2005, bilat CAEs 2000, s/p pacer 2010 for CHB & PAF; no changes made...  ~  August 02, 2011:  3-29mo ROV> states he is stable, doing better in the cooler weather, wants FLU shot today...  COPD> on Pred10mg - 1 alt w/ 1/2 qod, Symbicort, Spiriva, Singulair; breathing at baseline= occas wheezing w/ exertion, mild cough, beige sput, DOE w/o change, no edema; [facility]...  HBP> on Lopressor50Bid, CardizemCD, Cozaar, HCT12.5; BP= 146/78 & similar at home; denies  CP, palpit, syncope, ch in SOB, edema...  CAD, s/p CABG, Cardiomyopathy w/ EF=35-40%> on ASA + the above; denies angina & only exercise is "swimming [facility]" 3d/wk he says...  AFib, Pacer> on Coumadin + the above; Coumadin per [facility] & he has f/u due 11/11 w/ DrC....  Cerebrovasc dis> on ASA & Coumadin as noted; followed by [facility]; pt states last CDoppler was 10/11 & reported "OK"...  ASPVD> on ASA & Coumadin as noted; followed by [facility] w/ prev right fem art thrombectomy & right RA stent...  Chol> on Simva80, FishOil, CoQ10; FLP 4/12 reviewed w/ pt, he is not fasting today & wants to wait for his next OV 1/13...  Obesity> weight= 262# no change; we reviewed diet/ exercise/ wt reduction strategies; he must do better &  he knows it!  GI> Divertics, Polyps, Hems> on Prilosec, Miralax, Colase, AnusolHC; obese protuberant abd, denies pain, N/ V/ D/ C/ blood etc...  DJD, LBP> on Vicodin & averages 1-2 daily...  Anxiety> on Xanax and averages 1/2 tab bid...  ~  October 18, 2011:  75mo ROV & his CC= back pain that has been a prob x yrs but sl worse over the last month he says; prev eval by DrAplington but pt was not satisfied w/ his eval> offered second opinion w/ another Ortho group & he will think about it & let me know...  He has also been to see [facility] DrCroitoru w/ his combined sys & diastolic CHF, CAD, PAF, etc> he changed his meds & increased Lasix now on 80mg Bid he says +K20...  Pt feels he breaths better when fluid is down...  COPD> on Pred10mg -1alt w/ 1/2 qod, Symbicort160, Spiriva, Singulair; breathing is improved= min wheezing w/ exertion, mild cough, beige sput, DOE w/o change, no edema; [facility] by [facility] 3d/wk...  HBP> on Lopressor25Bid, CardizemCD180, Cozaar100, Lasix40-2Bid now, KCl Bid ; BP= 140/72 & similar at home; denies CP, palpit, syncope, ch in SOB, edema...  CAD, s/p CABG, Cardiomyopathy w/ EF=35-40%> on ASA + the above; denies angina & only exercise is "[facility]" 3d/wk he says...  AFib, Pacer> on Coumadin + the above; Coumadin per [facility] & he just had f/u appt DrC last week w/ Lasix incr to 2Bid & Lopressor decr to 1/2Bid.  Cerebrovasc dis> on ASA & Coumadin as noted; followed by [facility]; pt states last CDoppler was 10/11 & reported "OK", he denies cerebral ischemic symptoms.  ASPVD> on ASA & Coumadin as noted; followed by [facility] w/ prev right fem art thrombectomy & right RA stent...  Chol> on Simva80, FishOil, CoQ10; FLP 12/12 shows TChol 156, TG 195, HDL 42, LDL 75; needs better low fat diet & wt reduction...  Obesity> weight= 262# no change; we reviewed diet/ exercise/ wt reduction strategies; he must do better & he knows it!  GI> Divertics, Polyps, Hems> on Prilosec,  Miralax, Colase, AnusolHC; obese protuberant abd; denies pain, N/ V/ D/ C/ blood etc...  DJD, LBP> on Vicodin & averages 1-2 daily...  Anxiety> on Xanax and averages 1/2 tab bid...         Problem List:  BRONCHITIS, CHRONIC, ACUTE EXACERBATION (ICD-491.21) >> see prev notes... COPD (ICD-496) - ex-smoker, freq flairs when off Rx... now taking: SYMBICORT 160- 2spBid,  SPIRIVA daily, SINGULAIR 10mg /d,  MUCINEX 1-2 Bid w/ fluids, + FLONASE... ~  baseline CXR shows COPD, prev median sternotomy, NAD.Marland KitchenMarland Kitchen  ~   CT CHEST 4/09 w/ extensive coronary calcif, prior CABG, no signif  abn in the lungs. ~  PFT's 4/09 showed FVC=2.88 (71%), FEV1=2.13 (67%), FEV1/ FVC= 74%, mid-flows= 51%pred... ~  CXR 3/10 showed sl cardiomeg, pleural thickening, pacer, DJD sp, NAD. ~  CXR 12/11 showed s/p CABG, pacer on left, borderline cardiomeg, basilar scarring, NAD. ~  CXR 4/12 showed s/p CABG, pacer, extrapleural fat deposition bilat, NAD...  OBSTRUCTIVE SLEEP APNEA (ICD-327.23) - he does not tolerate his CPAP...  HYPERTENSION (ICD-401.9) controlled on meds:  METOPROLOL 50mg Bid, COZAAR 100/d, CARDIZEM CD 180mg /d, & HCTZ 12.5mg /d; BP control is fair 7 he desperately needs to lose the weight!  ARTERIOSCLEROTIC HEART DISEASE (ICD-414.00) - follow by [facility]... he is s/p CABG 1/05... he is on ASA 81mg /d,  now COUMADIN, + meds as listed above... note 2DEcho w/ LVD & EF= 35-40%... ~   CT CHEST 4/09 w/ extensive coronary calcif, prior CABG, no signif abn in the lungs. ~  cath 4/09 showed patent grafts, some disease in Circ & RCA, patent stent to left renal art... med Rx.  ~  2DEcho 5/11 showed mod conc LVH, mod reduced LVF w/ EF= 35-40% w/ apical AK, dil RA... ~  pt reports Nuclear Stress Test after 10/11 OV w/ DrSolomon was OK.  ATRIAL FIBRILLATION (ICD-427.31) CARDIAC PACEMAKER IN SITU (ICD-V45.01) - he had syncope w/ 4 fx right ribs and CHB & AFib requiring pacer & Coumadin started... [facility]u by DrSolomon [facility] + DrCroitoru... f/u  pacer checks OK & no changes made...  CEREBROVASCULAR DISEASE (ICD-437.9) - S/P Bilat CAE's 2000 & doppler's are followed by DrBerry...  ~  last CDoppler reported by [facility] 4/09 showed mod distal right CCA stenosis. ~  pt reports CDopplers done after 10/11 OV w/ DrSolomon was OK.  PERIPHERAL VASCULAR DISEASE (ICD-443.9) - S/P Rt fem art thrombectomy 2000, and Rt renal art stent 2002... he has all this followed by [facility] now & last doppler & cath showed patent stent.  HYPERCHOLESTEROLEMIA (ICD-272.0) - controlled on SIMVASTATIN 80mg /d & Fish Oil and labs followed by DrBerry (pt will ask for copies to be sent to [place])... he recently added NIASPAN for HDL of 31, but this was stopped due to flushing/ intol...  ~  FLP 12/09 showed TChol 138, TG 220, HDL 34, LDL 60 ~  FLP 12/10 showed TChol 132, TG 253, HDL 33, LDL 61 ~  FLP 12/11 showed TChol 144, TG 200, HDL 33, LDL 71... advised low fat diet & get weight down ~  FLP 12/12 on Simva80 showed  TChol 156, TG 195, HDL 42, LDL 75; needs better low fat diet & wt reduction...  OBESITY (ICD-278.00) - weight fluctuates betw 240-250# & he has been unable to lose weight... he is sedentary and we discussed an exercise program for him, since he is limited by LBP. ~  NOTE:  labs 12/09 showed BS= 131, HgA1c= 6.1.[name]. advised low carb diet, get wt down! ~  6/10: wt today= 249#, but pt states down to 233# at home- "just decr portions" ~  12/10:  wt today= 252# post holiday... he states "I'm starving myself to death"... ~  2023/04/25:  weight = 252#.[name]. states he's lost 4" at the waist... ~  12/11:  weight = 252# ~  4/12:  Weight = 256# ~  6/12:  Weight = 262# ~  12/12:  Weight = 262#  DIVERTICULOSIS OF COLON (ICD-562.10) COLONIC POLYPS (ICD-211.3) HEMORRHOIDS (ICD-455.6) ~  last colonoscopy was 12/07 by DrStark and showed divertic, polyps 2-74mm size (ademomas), and hems... f/u planned 60yrs.  DEGENERATIVE JOINT DISEASE (  ICD-715.90) LOW BACK PAIN, CHRONIC (ICD-724.2) ~  he  uses DCN 100 for pain and prev requested refill of #360 for 3 month supply... ~  12/10: DCN now off the market & Rx for Vicodin #270- up to 3/d as needed for pain. ~  12/12:  He is c/o incr back pain & he doesn't want to f/u w/ DrApling  ANXIETY (ICD-300.00) - he uses ALPRAZOLAM 0.5mg  Tid and prev requested #270 for 3 month supply...   Past Surgical History  Procedure Date  . Pta to right common femoral artery 2001    Dr. [name]  . Carotid endarterectomy 2002    bilateral.  Dr. Hart Rochester  . Coronary artery bypass graft 2005  . Pacemaker placement July 2009    for CHB  Dr. Allyson Sabal.    Outpatient Encounter Prescriptions as of 10/18/2011  Medication Sig Dispense Refill  . ALPRAZolam (XANAX) 0.5 MG tablet Take 1 tablet (0.5 mg total) by mouth 3 (three) times daily as needed. Not to exceed 3 per day.  270 tablet  1  . aspirin 81 MG tablet Take 81 mg by mouth daily.        . budesonide-formoterol (SYMBICORT) 160-4.5 MCG/ACT inhaler Inhale 2 puffs into the lungs 2 (two) times daily.        . Coenzyme Q10 (COQ10) 100 MG CAPS Take 1 capsule by mouth daily.        [name] diltiazem (CARDIZEM CD) 180 MG 24 hr capsule Take 180 mg by mouth daily.        [name] docusate sodium (COLACE) 100 MG capsule Take 2 tabs by mouth once daily       . fluticasone (FLONASE) 50 MCG/ACT nasal spray 1 spray by Nasal route 2 (two) times daily.        . furosemide (LASIX) 40 MG tablet Take 40 mg by mouth 2 (two) times daily.        [name] guaiFENesin (MUCINEX) 600 MG 12 hr tablet Take 1 to 2 tabs by mouth two times a day with fluids for thick phlegm       . hydrochlorothiazide (,MICROZIDE/HYDRODIURIL,) 12.5 MG capsule Take 12.5 mg by mouth daily.        [name] HYDROcodone-acetaminophen (VICODIN) 5-500 MG per tablet Take 1 tab by mouth every 8 to 12 hours as needed for pain. Not to exceed 3 per day.  270 tablet  1  . hydrocortisone (PROCTOSOL HC) 2.5 % rectal cream as directed.        [name] losartan (COZAAR) 100 MG tablet Take 100 mg by mouth  daily.        . meclizine (ANTIVERT) 25 MG tablet Take 1/2 to 1 tab by mouth every 6 hours as needed for dizziness.       . metoprolol (LOPRESSOR) 50 MG tablet Take 50 mg by mouth 2 (two) times daily.        . montelukast (SINGULAIR) 10 MG tablet Take 10 mg by mouth at bedtime.        . Omega-3 Fatty Acids (FISH OIL) 1000 MG CAPS Take 1 capsule by mouth daily.        [name] omeprazole (PRILOSEC OTC) 20 MG tablet Take 20 mg by mouth daily.        . polyethylene glycol (MIRALAX / GLYCOLAX) packet Take 17 g by mouth daily.        . predniSONE (DELTASONE) 10 MG tablet Take as directed  50 tablet  11  . simvastatin (ZOCOR) 80 MG tablet  Take 80 mg by mouth at bedtime.        [name] tiotropium (SPIRIVA) 18 MCG inhalation capsule Place 18 mcg into inhaler and inhale daily.        [name] warfarin (COUMADIN) 5 MG tablet Take as directed by Dr. Allyson Sabal         Allergies  Allergen Reactions  . Penicillins     REACTION: Allergic to PCN w/ throat swelling \\T \ hives    Current Medications, Allergies, Past Medical History, Past Surgical History, Family History, and Social History were reviewed in [employer] record.    Review of Systems       See HPI - all other systems neg except as noted...      The patient complains of dyspnea on exertion.  The patient denies anorexia, fever, weight loss, weight gain, vision loss, decreased hearing, hoarseness, chest pain, syncope, peripheral edema, prolonged cough, headaches, hemoptysis, abdominal pain, melena, hematochezia, severe indigestion/heartburn, hematuria, incontinence, muscle weakness, suspicious skin lesions, transient blindness, difficulty walking, depression, unusual weight change, abnormal bleeding, enlarged lymph nodes, and angioedema.     Objective:   Physical Exam     WD, Obese, 77 y/o WM in NAD... GENERAL:  Alert & oriented; pleasant & cooperative... HEENT:  [place]/AT, EOM-full, PERRLA, TMs- distal wax blocking left TM, NOSE-clear, THROAT- sl  red w/o exud... NECK:  Supple w/ fair ROM; no JVD; s/p bilat CAE's w/ bruits; no thyromegaly or nodules palpated; no lymphadenopathy. CHEST:  decr BS bilat w/ few scat rhonchi, no rales, no signs of consolidation... HEART:  Regular Rhythm; without murmurs/ rubs/ or gallops- median sternotomy scar...pacemaker in place ABDOMEN:  Obese, soft & nontender; normal bowel sounds; no organomegaly or masses detected. EXT: warm and dry, mild arthritic changes; no varicose veins/ +venous insuffic/ no edema. NEURO:  CN's intact; no focal neuro deficits...  DERM: no lesions seen, no rash etc...  RADIOLOGY DATA:  Reviewed in the EPIC EMR & discussed w/ the patient...  LABORATORY DATA:  Reviewed in the EPIC EMR & discussed w/ the patient...   Assessment & Plan:   COPD/ Chr Bronchitis>  Improved w/ the Pred at10mg  alt w/ 5mg  Qod; continue this and his other meds: Symbicort, Spiriva, Singulair, Mucinex, etc...  HBP, CHF w/ combined sys & dias>  Fair control on meds, adjusted recently by [facility] w/ incr Lasix, continue same for now but he desperately needs to lose weight...  ASHD/ AFib/ Pacer>  Followed by DrCroitoru [facility] & stable; on Coumadin via their CC...  Cerebrovasc & Peripheral vasc dis>  Also followed regularly by [facility] (last note 4/12- we don't have their Doppler studies)...  CHOL>  He remains on Simva80 from [facility], FLP shows sl incr TG as before> advised low fat diet, weight reduction, incr exercise ...  Obesity>  Still hasn't been able to lose any wt; we reviewed diet, exercise, wt reduction program...  DJD>  Feeling better w/ the Pred, uses Vicodin as needed...  Anxiety>  Stable on Alpraz Prn...   Patient's Medications  New Prescriptions   No medications on file  Previous Medications   ASPIRIN 81 MG TABLET    Take 81 mg by mouth daily.     COENZYME Q10 (COQ10) 100 MG CAPS    Take 1 capsule by mouth daily.     DILTIAZEM (CARDIZEM CD) 180 MG 24 HR CAPSULE    Take 180 mg by mouth daily.      DOCUSATE SODIUM (COLACE) 100 MG CAPSULE  Take 2 tabs by mouth once daily    FUROSEMIDE (LASIX) 40 MG TABLET    Take as directed by Dr. Salena Saner.   GUAIFENESIN (MUCINEX) 600 MG 12 HR TABLET    Take 1 to 2 tabs by mouth two times a day with fluids for thick phlegm    HYDROCORTISONE (PROCTOSOL HC) 2.5 % RECTAL CREAM    as directed.     LOSARTAN (COZAAR) 100 MG TABLET    Take 100 mg by mouth daily.     METOPROLOL (LOPRESSOR) 50 MG TABLET    Take 1/2 tablet by mouth two times daily by Dr. Salena Saner.   OMEGA-3 FATTY ACIDS (FISH OIL) 1000 MG CAPS    Take 1 capsule by mouth daily.     OMEPRAZOLE (PRILOSEC OTC) 20 MG TABLET    Take 20 mg by mouth daily.     POLYETHYLENE GLYCOL (MIRALAX / GLYCOLAX) PACKET    Take 17 g by mouth daily.     SIMVASTATIN (ZOCOR) 80 MG TABLET    Take 80 mg by mouth at bedtime.     WARFARIN (COUMADIN) 5 MG TABLET    Take as directed by Dr. Allyson Sabal   Modified Medications   Modified Medication Previous Medication   ALPRAZOLAM (XANAX) 0.5 MG TABLET ALPRAZolam (XANAX) 0.5 MG tablet      Take 1 tablet (0.5 mg total) by mouth 3 (three) times daily as needed. Not to exceed 3 per day.    Take 1 tablet (0.5 mg total) by mouth 3 (three) times daily as needed. Not to exceed 3 per day.   BUDESONIDE-FORMOTEROL (SYMBICORT) 160-4.5 MCG/ACT INHALER budesonide-formoterol (SYMBICORT) 160-4.5 MCG/ACT inhaler      Inhale 2 puffs into the lungs 2 (two) times daily.    Inhale 2 puffs into the lungs 2 (two) times daily.     FLUTICASONE (FLONASE) 50 MCG/ACT NASAL SPRAY fluticasone (FLONASE) 50 MCG/ACT nasal spray      Place 1 spray into the nose 2 (two) times daily.    1 spray by Nasal route 2 (two) times daily.     HYDROCODONE-ACETAMINOPHEN (VICODIN) 5-500 MG PER TABLET HYDROcodone-acetaminophen (VICODIN) 5-500 MG per tablet      Take 1 tab by mouth every 8 to 12 hours as needed for pain. Not to exceed 3 per day.    Take 1 tab by mouth every 8 to 12 hours as needed for pain. Not to exceed 3 per day.   MECLIZINE  (ANTIVERT) 25 MG TABLET meclizine (ANTIVERT) 25 MG tablet      Take 1 tablet (25 mg total) by mouth 3 (three) times daily as needed. Take 1/2 to 1 tab by mouth every 6 hours as needed for dizziness.    Take 1/2 to 1 tab by mouth every 6 hours as needed for dizziness.    MONTELUKAST (SINGULAIR) 10 MG TABLET montelukast (SINGULAIR) 10 MG tablet      Take 1 tablet (10 mg total) by mouth at bedtime.    Take 10 mg by mouth at bedtime.     PREDNISONE (DELTASONE) 10 MG TABLET predniSONE (DELTASONE) 10 MG tablet      Take as directed    Take as directed   TIOTROPIUM (SPIRIVA) 18 MCG INHALATION CAPSULE tiotropium (SPIRIVA) 18 MCG inhalation capsule      Place 1 capsule (18 mcg total) into inhaler and inhale daily.    Place 18 mcg into inhaler and inhale daily.    Discontinued Medications   HYDROCHLOROTHIAZIDE (,MICROZIDE/HYDRODIURIL,) 12.5 MG  CAPSULE    Take 12.5 mg by mouth daily.

## 2011-10-18 NOTE — Patient Instructions (Signed)
Today we updated your med list in our EPIC system...    Continue your current medications the same...    We refilled your meds per request...  Continue your breathing meds the same as your lung condition appears stable>     Prednisone 10mg  take 1 tab alternating w/ 1/2 tab every other day...    Symbicort, Spiriva, Singulair> continue same as before...  Continue the meds & LASIX per DrC at [facility]...  We gave you a copy of your recent labs to share w/ Cardiology...  Call for any questions...  Let's plan a follow up visit in 4 months time.Marland KitchenMarland Kitchen

## 2011-10-25 ENCOUNTER — Telehealth: Payer: Self-pay | Admitting: Pulmonary Disease

## 2011-10-25 NOTE — Telephone Encounter (Signed)
Spoke with Todd Kelly with [employer] and they needed clarification on the directions of the Meclizine 25mg . I informed her of the take 1/2 to 1 tab by mouth every 6 hours as needed directions. Spouse aware. No additional action at this time.

## 2011-10-26 ENCOUNTER — Other Ambulatory Visit: Payer: Self-pay | Admitting: Pulmonary Disease

## 2011-10-26 MED ORDER — MECLIZINE HCL 25 MG PO TABS: 25.0000 mg | ORAL_TABLET | Freq: Three times a day (TID) | ORAL | Status: DC | PRN

## 2011-10-26 NOTE — Telephone Encounter (Signed)
RX sent

## 2011-11-04 ENCOUNTER — Ambulatory Visit: Payer: 59 | Admitting: Pulmonary Disease

## 2011-11-06 HISTORY — PX: US ECHOCARDIOGRAPHY: HXRAD669

## 2012-02-15 ENCOUNTER — Ambulatory Visit: Payer: 59 | Admitting: Pulmonary Disease

## 2012-04-10 ENCOUNTER — Encounter: Payer: Self-pay | Admitting: Pulmonary Disease

## 2012-04-10 ENCOUNTER — Ambulatory Visit (INDEPENDENT_AMBULATORY_CARE_PROVIDER_SITE_OTHER): Payer: 59 | Admitting: Pulmonary Disease

## 2012-04-10 VITALS — BP 122/68 | HR 68 | Temp 96.9°F | Ht 69.0 in | Wt 245.6 lb

## 2012-04-10 DIAGNOSIS — M545 Low back pain: Secondary | ICD-10-CM

## 2012-04-10 DIAGNOSIS — M1990 Unspecified osteoarthritis, unspecified site: Secondary | ICD-10-CM

## 2012-04-10 DIAGNOSIS — D126 Benign neoplasm of colon, unspecified: Secondary | ICD-10-CM

## 2012-04-10 DIAGNOSIS — I10 Essential (primary) hypertension: Secondary | ICD-10-CM

## 2012-04-10 DIAGNOSIS — I2510 Atherosclerotic heart disease of native coronary artery without angina pectoris: Secondary | ICD-10-CM

## 2012-04-10 DIAGNOSIS — I679 Cerebrovascular disease, unspecified: Secondary | ICD-10-CM

## 2012-04-10 DIAGNOSIS — Z950 Presence of cardiac pacemaker: Secondary | ICD-10-CM

## 2012-04-10 DIAGNOSIS — I4891 Unspecified atrial fibrillation: Secondary | ICD-10-CM

## 2012-04-10 DIAGNOSIS — I739 Peripheral vascular disease, unspecified: Secondary | ICD-10-CM

## 2012-04-10 DIAGNOSIS — K5730 Diverticulosis of large intestine without perforation or abscess without bleeding: Secondary | ICD-10-CM

## 2012-04-10 DIAGNOSIS — F411 Generalized anxiety disorder: Secondary | ICD-10-CM

## 2012-04-10 DIAGNOSIS — E7800 Pure hypercholesterolemia, unspecified: Secondary | ICD-10-CM

## 2012-04-10 DIAGNOSIS — J449 Chronic obstructive pulmonary disease, unspecified: Secondary | ICD-10-CM

## 2012-04-10 DIAGNOSIS — E669 Obesity, unspecified: Secondary | ICD-10-CM

## 2012-04-10 MED ORDER — HYDROCODONE-ACETAMINOPHEN 5-500 MG PO TABS: ORAL_TABLET | ORAL | Status: DC

## 2012-04-10 MED ORDER — ALPRAZOLAM 0.5 MG PO TABS: 0.5000 mg | ORAL_TABLET | Freq: Three times a day (TID) | ORAL | Status: DC | PRN

## 2012-04-10 NOTE — Progress Notes (Signed)
Subjective:    Patient ID: Todd Kelly, male    DOB: 1931/06/06, 78 y.o.   MRN: [id number]  HPI 78 y/o WM here for a follow up visit... he has mult med problems including:  COPD/ AB/ ex-smoker;  HBP/ ASHD/ AFib/ Pacer- followed by DrSolomon/ [facility];  Cerebrovasc & peripheral vasc disease;  Hyperlipidemia/ Obesity;  DJD/ LBP;  Anxiety, etc...  ~  October 18, 2011:  84mo ROV & his CC= back pain that has been a prob x yrs but sl worse over the last month he says; prev eval by DrAplington but pt was not satisfied w/ his eval> offered second opinion w/ another Ortho group & he will think about it & let me know...  He has also been to see [facility] DrCroitoru w/ his combined sys & diastolic CHF, CAD, PAF, etc> he changed his meds & increased Lasix now on 80mg Bid he says +K20...  Pt feels he breaths better when fluid is down... COPD> on Pred10mg -1alt w/ 1/2 qod, Symbicort160, Spiriva, Singulair; breathing is improved= min wheezing w/ exertion, mild cough, beige sput, DOE w/o change, no edema; [facility] [place] [facility]... HBP> on Lopressor25Bid, CardizemCD180, Cozaar100, Lasix40-2Bid now, KCl Bid ; BP= 140/72 & similar at home; denies CP, palpit, syncope, ch in SOB, edema... CAD, s/p CABG, Cardiomyopathy w/ EF=35-40%> on ASA + the above; denies angina & only exercise [place] 3[place] he says... AFib, Pacer> on Coumadin + the above; Coumadin per [facility] & he just had f/u appt DrC last week w/ Lasix incr to 2Bid & Lopressor decr to 1/2Bid. Cerebrovasc dis> on ASA & Coumadin as noted; followed by [facility]; pt states last CDoppler was 10/11 & reported "OK", he denies cerebral ischemic symptoms. ASPVD> on ASA & Coumadin as noted; followed by [facility] w/ prev right fem art thrombectomy & right RA stent... Chol> on Simva80, FishOil, CoQ10; FLP 12/12 shows TChol 156, TG 195, HDL 42, LDL 75; needs better low fat diet & wt reduction... Obesity> weight= 262# no change; we reviewed diet/ exercise/ wt  reduction strategies; he must do better & he knows it! GI> Divertics, Polyps, Hems> on Prilosec, Miralax, Colase, AnusolHC; obese protuberant abd; denies pain, N/ V/ D/ C/ blood etc... DJD, LBP> on Vicodin & averages 1-2 daily... Anxiety> on Xanax and averages 1/2 tab bid...  ~  April 10, 2012:  31mo ROV & [name] has had a stable 31mo interval just c/o fatigue, lack of energy, etc; he is followed regularly for Cards by DrCroitoru- last seen 4/13 & his note is reviewed> Meds have been adjusted w/ LASIX increased then cut back down- he has lost 16+lbs w/ this diuresis & he is going to continue the Lasix40 therefore we will discontinue his HCT12.5.Marland KitchenMarland Kitchen SEE MED RECON BELOW>> COPD> on Pred10mg -1alt w/ 1/2 qod, Symbicort160, Spiriva, Singulair; breathing is improved= min wheezing w/ exertion, mild cough, beige sput, DOE w/o change, no edema; [place] & he is asked to walk on the other days... HBP> on Lopressor25Bid, Cozaar100, Lasix40/d now, KCl20/d ; BP= 122/68 & similar at home; denies CP, palpit, syncope, ch in SOB, edema... CAD, s/p CABG, Cardiomyopathy w/ EF=35-40%> on ASA + the above; denies angina & only exercise [place]wk he says; CHF improved after vigorous diuresis by DrCroitoru w/ Lasix80Bid, then slowly tapered & now at 40mg /d... AFib, Pacer> on Coumadin + the above; Coumadin per [facility]==> recently changed from Coumadin to [facility]  20mg /d... Cerebrovasc dis> on ASA & Xarelto as noted; followed by [facility]; pt states last CDoppler was 10/11 & reported "OK", he denies cerebral ischemic symptoms. ASPVD> on ASA & Xarelto as noted; followed by [facility] w/ prev right fem art thrombectomy & right RA stent... Chol> on Simva80, FishOil, CoQ10; FLP 12/12 shows TChol 156, TG 195, HDL 42, LDL 75; needs better low fat diet & wt reduction... Obesity> weight= 246# which represents a 16# wt reduction over 53mo; we reviewed diet/ exercise/ wt reduction strategies; great job!!! GI>  Divertics, Polyps, Hems> off Prilosec & on Ranitadine 150mg  Bid; on Miralax, Colase, AnusolHC; obese protuberant abd; denies pain, N/ V/ D/ C/ blood etc... DJD, LBP> on Vicodin & averages 1-2 daily... Anxiety> on Xanax and averages 1/2 tab bid...    We reviewed prob list, meds, xrays and labs> see below>> We do not have recent labs from [facility]...         Problem List:  BRONCHITIS, CHRONIC, ACUTE EXACERBATION (ICD-491.21) >> see prev notes... COPD (ICD-496) - ex-smoker, freq flairs when off Rx... now taking: SYMBICORT 160- 2spBid,  SPIRIVA daily, SINGULAIR 10mg /d,  MUCINEX 1-2 Bid w/ fluids, + FLONASE... ~  baseline CXR shows COPD, prev median sternotomy, NAD.Marland KitchenMarland Kitchen  ~   CT CHEST 4/09 w/ extensive coronary calcif, prior CABG, no signif abn in the lungs. ~  PFT's 4/09 showed FVC=2.88 (71%), FEV1=2.13 (67%), FEV1/ FVC= 74%, mid-flows= 51%pred... ~  CXR 3/10 showed sl cardiomeg, pleural thickening, pacer, DJD sp, NAD. ~  CXR 12/11 showed s/p CABG, pacer on left, borderline cardiomeg, basilar scarring, NAD. ~  CXR 4/12 showed s/p CABG, pacer, extrapleural fat deposition bilat, NAD...  OBSTRUCTIVE SLEEP APNEA (ICD-327.23) - he does not tolerate his CPAP...  HYPERTENSION (ICD-401.9) controlled on meds:  METOPROLOL 25mg Bid, COZAAR 100/d, LASIX 40mg /d; BP= 122/68 today 7 he denies CP, palpit, SOB, edema...  ARTERIOSCLEROTIC HEART DISEASE >> s/p CABG 2005 ISCHEMIC CARDIOMYOPATHY >> EF=35-40% ~  he is on ASA 81mg /d,  Now XARELTO 20mg /d, + meds as listed above... ~   CT CHEST 4/09 w/ extensive coronary calcif, prior CABG, no signif abn in the lungs. ~  cath 4/09 showed patent grafts, some disease in Circ & RCA, patent stent to left renal art... med Rx.  ~  2DEcho 5/11 showed mod conc LVH, mod reduced LVF w/ EF= 35-40% w/ apical AK, dil RA... ~  pt reports Nuclear Stress Test after 10/11 OV w/ DrSolomon was OK.  ATRIAL FIBRILLATION & Tachy-Brady Syndrome >> COMPLETE HEART BLOCK & CARDIAC PACEMAKER IN  SITU >> ~  he had syncope w/ 4 fx right ribs and CHB & AFib requiring pacer & Coumadin started... [facility] + DrCroitoru... f/u pacer checks OK & no changes made...  CEREBROVASCULAR DISEASE (ICD-437.9) - S/P Bilat CAE's 2000 & doppler's are followed by DrBerry...  ~  last CDoppler reported by [facility] 4/09 showed mod distal right CCA stenosis. ~  pt reports CDopplers done after 10/11 OV w/ DrSolomon was "OK" ~  DrC note of 4/13 indicates last CDoppler w/ 50-69%restenosis on the right;  He is due for repeat CDoppler soon...  PERIPHERAL VASCULAR DISEASE (ICD-443.9) - S/P Rt fem art thrombectomy 2000, and Rt renal art stent 2002... he has all this followed by [facility] now & last doppler & cath showed patent stent.  HYPERCHOLESTEROLEMIA (ICD-272.0) - controlled on SIMVASTATIN 80mg /d & Fish Oil and labs followed by DrBerry (pt will ask for copies to be sent to [place])... he recently added NIASPAN  for HDL of 31, but this was stopped due to flushing/ intol...  ~  FLP 12/09 showed TChol 138, TG 220, HDL 34, LDL 60 ~  FLP 12/10 showed TChol 132, TG 253, HDL 33, LDL 61 ~  FLP 12/11 showed TChol 144, TG 200, HDL 33, LDL 71... advised low fat diet & get weight down ~  FLP 12/12 on Simva80 showed  TChol 156, TG 195, HDL 42, LDL 75; needs better low fat diet & wt reduction...  OBESITY (ICD-278.00) - weight fluctuates betw 240-250# & he has been unable to lose weight... he is sedentary and we discussed an exercise program for him, since he is limited by LBP. ~  NOTE:  labs 12/09 showed BS= 131, HgA1c= 6.1.[name]. advised low carb diet, get wt down! ~  6/10: wt today= 249#, but pt states down to 233# at home- "just decr portions" ~  12/10:  wt today= 252# post holiday... he states "I'm starving myself to death"... ~  04/03/2023:  weight = 252#.[name]. states he's lost 4" at the waist... ~  12/11:  weight = 252# ~  4/12:  Weight = 256# ~  6/12:  Weight = 262# ~  12/12:  Weight = 262# ~  6/13:  Weight =  246#  DIVERTICULOSIS OF COLON (ICD-562.10) COLONIC POLYPS (ICD-211.3) HEMORRHOIDS (ICD-455.6) ~  last colonoscopy was 12/07 by DrStark and showed divertic, polyps 2-23mm size (ademomas), and hems... f/u planned 37yrs.  DEGENERATIVE JOINT DISEASE (ICD-715.90) LOW BACK PAIN, CHRONIC (ICD-724.2) ~  he uses DCN 100 for pain and prev requested refill of #360 for 3 month supply... ~  12/10: DCN now off the market & Rx for Vicodin #270- up to 3/d as needed for pain. ~  12/12:  He is c/o incr back pain & he doesn't want to f/u w/ DrApling  ANXIETY (ICD-300.00) - he uses ALPRAZOLAM 0.5mg  Tid and prev requested #270 for 3 month supply...   Past Surgical History  Procedure Date  . Pta to right common femoral artery 2001    Dr. [name]  . Carotid endarterectomy 2002    bilateral.  Dr. Hart [place]  . Coronary artery bypass graft 2005  . Pacemaker placement July 2009    for CHB  Dr. Allyson Sabal.    Outpatient Encounter Prescriptions as of 04/10/2012  Medication Sig Dispense Refill  . ALPRAZolam (XANAX) 0.5 MG tablet Take 1 tablet (0.5 mg total) by mouth 3 (three) times daily as needed. Not to exceed 3 per day.  270 tablet  3  . aspirin 81 MG tablet Take 81 mg by mouth daily.        . budesonide-formoterol (SYMBICORT) 160-4.5 MCG/ACT inhaler Inhale 2 puffs into the lungs 2 (two) times daily.  3 Inhaler  3  . Coenzyme Q10 (COQ10) 100 MG CAPS Take 1 capsule by mouth daily.        [name] diltiazem (CARDIZEM CD) 180 MG 24 hr capsule Take 180 mg by mouth daily.        [name] docusate sodium (COLACE) 100 MG capsule Take 2 tabs by mouth once daily       . fluticasone (FLONASE) 50 MCG/ACT nasal spray Place 1 spray into the nose 2 (two) times daily.  48 g  3  . furosemide (LASIX) 40 MG tablet Take as directed by Dr. Salena Saner.      . guaiFENesin (MUCINEX) 600 MG 12 hr tablet Take 1 to 2 tabs by mouth two times a day with fluids for thick phlegm       .  HYDROcodone-acetaminophen (VICODIN) 5-500 MG per tablet Take 1 tab by mouth every 8  to 12 hours as needed for pain. Not to exceed 3 per day.  270 tablet  3  . hydrocortisone (PROCTOSOL HC) 2.5 % rectal cream as directed.        [name] losartan (COZAAR) 100 MG tablet Take 100 mg by mouth daily.        . meclizine (ANTIVERT) 25 MG tablet Take 1 tablet (25 mg total) by mouth 3 (three) times daily as needed. Take 1/2 to 1 tab by mouth every 6 hours as needed for dizziness.  90 tablet  0  . metoprolol (LOPRESSOR) 50 MG tablet Take 1/2 tablet by mouth two times daily by Dr. Salena Saner.      . montelukast (SINGULAIR) 10 MG tablet Take 1 tablet (10 mg total) by mouth at bedtime.  90 tablet  3  . Omega-3 Fatty Acids (FISH OIL) 1000 MG CAPS Take 1 capsule by mouth daily.        [name] omeprazole (PRILOSEC OTC) 20 MG tablet Take 20 mg by mouth daily.        . polyethylene glycol (MIRALAX / GLYCOLAX) packet Take 17 g by mouth daily.        . predniSONE (DELTASONE) 10 MG tablet Take as directed  125 tablet  3  . simvastatin (ZOCOR) 80 MG tablet Take 80 mg by mouth at bedtime.        [name] tiotropium (SPIRIVA) 18 MCG inhalation capsule Place 1 capsule (18 mcg total) into inhaler and inhale daily.  90 capsule  3  . warfarin (COUMADIN) 5 MG tablet Take as directed by Dr. Allyson Sabal         Allergies  Allergen Reactions  . Penicillins     REACTION: Allergic to PCN w/ throat swelling \\T \ hives    Current Medications, Allergies, Past Medical History, Past Surgical History, Family History, and Social History were reviewed in [employer] record.    Review of Systems       See HPI - all other systems neg except as noted...      The patient complains of dyspnea on exertion.  The patient denies anorexia, fever, weight loss, weight gain, vision loss, decreased hearing, hoarseness, chest pain, syncope, peripheral edema, prolonged cough, headaches, hemoptysis, abdominal pain, melena, hematochezia, severe indigestion/heartburn, hematuria, incontinence, muscle weakness, suspicious skin lesions, transient  blindness, difficulty walking, depression, unusual weight change, abnormal bleeding, enlarged lymph nodes, and angioedema.     Objective:   Physical Exam     WD, Obese, 78 y/o WM in NAD... GENERAL:  Alert & oriented; pleasant & cooperative... HEENT:  [place]/AT, EOM-full, PERRLA, TMs- distal wax blocking left TM, NOSE-clear, THROAT- sl red w/o exud... NECK:  Supple w/ fair ROM; no JVD; s/p bilat CAE's w/ bruits; no thyromegaly or nodules palpated; no lymphadenopathy. CHEST:  decr BS bilat w/ few scat rhonchi, no rales, no signs of consolidation... HEART:  Regular Rhythm; without murmurs/ rubs/ or gallops- median sternotomy scar...pacemaker in place ABDOMEN:  Obese, soft & nontender; normal bowel sounds; no organomegaly or masses detected. EXT: warm and dry, mild arthritic changes; no varicose veins/ +venous insuffic/ no edema. NEURO:  CN's intact; no focal neuro deficits...  DERM: no lesions seen, no rash etc...  RADIOLOGY DATA:  Reviewed in the EPIC EMR & discussed w/ the patient...  LABORATORY DATA:  Reviewed in the EPIC EMR & discussed w/ the patient...   Assessment & Plan:   COPD/  Chr Bronchitis>  Improved w/ the Pred at10mg  alt w/ 5mg  Qod; continue this and his other meds: Symbicort, Spiriva, Singulair, Mucinex, etc...  HBP, CHF w/ combined sys & dias>  Fair control on meds, adjusted recently by [facility] w/ incr Lasix, continue same for now but he desperately needs to lose weight...  ASHD/ AFib/ Pacer>  Followed by DrCroitoru [facility] & stable; on Coumadin via their CC...  Cerebrovasc & Peripheral vasc dis>  Also followed regularly by [facility] (last note 4/12- we don't have their Doppler studies)...  CHOL>  He remains on Simva80 from [facility], FLP shows sl incr TG as before> advised low fat diet, weight reduction, incr exercise ...  Obesity>  Still hasn't been able to lose any wt; we reviewed diet, exercise, wt reduction program...  DJD>  Feeling better w/ the Pred, uses Vicodin as  needed...  Anxiety>  Stable on Alpraz Prn...   Patient's Medications  New Prescriptions   No medications on file  Previous Medications   ASPIRIN 81 MG TABLET    Take 81 mg by mouth daily.     BUDESONIDE-FORMOTEROL (SYMBICORT) 160-4.5 MCG/ACT INHALER    Inhale 2 puffs into the lungs 2 (two) times daily.   COENZYME Q10 (COQ10) 100 MG CAPS    Take 1 capsule by mouth daily.     DOCUSATE SODIUM (COLACE) 100 MG CAPSULE    Take 2 tabs by mouth once daily    FLUTICASONE (FLONASE) 50 MCG/ACT NASAL SPRAY    Place 1 spray into the nose 2 (two) times daily.   FUROSEMIDE (LASIX) 40 MG TABLET    Take as directed by Dr. Salena Saner.   GUAIFENESIN (MUCINEX) 600 MG 12 HR TABLET    Take 1 to 2 tabs by mouth two times a day with fluids for thick phlegm    HYDROCORTISONE (PROCTOSOL HC) 2.5 % RECTAL CREAM    as directed.     LOSARTAN (COZAAR) 100 MG TABLET    Take 100 mg by mouth daily.     MECLIZINE (ANTIVERT) 25 MG TABLET    Take 1 tablet (25 mg total) by mouth 3 (three) times daily as needed. Take 1/2 to 1 tab by mouth every 6 hours as needed for dizziness.   METOPROLOL (LOPRESSOR) 50 MG TABLET    Take 1/2 tablet by mouth two times daily by Dr. Salena Saner.   MONTELUKAST (SINGULAIR) 10 MG TABLET    Take 1 tablet (10 mg total) by mouth at bedtime.   OMEGA-3 FATTY ACIDS (FISH OIL) 1000 MG CAPS    Take 1 capsule by mouth daily.     POLYETHYLENE GLYCOL (MIRALAX / GLYCOLAX) PACKET    Take 17 g by mouth daily.     POTASSIUM CHLORIDE SA (K-DUR,KLOR-CON) 20 MEQ TABLET    Take 20 mEq by mouth daily.   PREDNISONE (DELTASONE) 10 MG TABLET    Take as directed   RANITIDINE (ZANTAC) 150 MG CAPSULE    Take 150 mg by mouth 2 (two) times daily.   RIVAROXABAN (XARELTO PO)    Take 20 mg by mouth daily.    SIMVASTATIN (ZOCOR) 80 MG TABLET    Take 80 mg by mouth at bedtime.     TIOTROPIUM (SPIRIVA) 18 MCG INHALATION CAPSULE    Place 1 capsule (18 mcg total) into inhaler and inhale daily.  Modified Medications   Modified Medication Previous  Medication   ALPRAZOLAM (XANAX) 0.5 MG TABLET ALPRAZolam (XANAX) 0.5 MG tablet      Take 1 tablet (  0.5 mg total) by mouth 3 (three) times daily as needed for anxiety. Not to exceed 3 per day.    Take 1 tablet (0.5 mg total) by mouth 3 (three) times daily as needed. Not to exceed 3 per day.   HYDROCODONE-ACETAMINOPHEN (VICODIN) 5-500 MG PER TABLET HYDROcodone-acetaminophen (VICODIN) 5-500 MG per tablet      Take 1 tab by mouth every 8 to 12 hours as needed for pain. Not to exceed 3 per day.    Take 1 tab by mouth every 8 to 12 hours as needed for pain. Not to exceed 3 per day.  Discontinued Medications   DILTIAZEM (CARDIZEM CD) 180 MG 24 HR CAPSULE    Take 180 mg by mouth daily.     HYDROCHLOROTHIAZIDE (MICROZIDE) 12.5 MG CAPSULE    Take 12.5 mg by mouth daily.   OMEPRAZOLE (PRILOSEC OTC) 20 MG TABLET    Take 20 mg by mouth daily.     WARFARIN (COUMADIN) 5 MG TABLET    Take as directed by Dr. Allyson Sabal

## 2012-04-10 NOTE — Patient Instructions (Addendum)
Today we updated your med list in our EPIC system...    Continue your current medications the same...    We tried to update your med list taking into account the changes made by [facility]...  Stop the HCTZ (you are on Lasix which is a stronger diuretic)... Continue the Potassium one daily... Let's decrease the PREDNISONE 10mg  tabs to 1/2 tab each AM...  Labs have been checked by [facility]...  Call for any questions...  Let's plan a routine f/u in 6 months w/ FASTING blood work at that time.Marland KitchenMarland Kitchen

## 2012-05-02 ENCOUNTER — Encounter: Payer: Self-pay | Admitting: Gastroenterology

## 2012-08-10 ENCOUNTER — Telehealth: Payer: Self-pay | Admitting: Pulmonary Disease

## 2012-08-10 NOTE — Telephone Encounter (Signed)
LMTCB on work and home number Cell has been d/c'ed

## 2012-08-10 NOTE — Telephone Encounter (Signed)
Avelox 400mg  daily x 7 d #7  Take with food Please contact office for sooner follow up if symptoms do not improve or worsen or seek emergency care

## 2012-08-10 NOTE — Telephone Encounter (Signed)
Called and spoke with pts wife and she stated that pt has had a cold since last Friday.  This is now going to his chest and pt has cough with pale sputum, low grade fever x 2-3 days, pt has been taking mucinex dm and coricidin without any relief.  pts wife stated that he would like abx called to the pharmacy and that avelox works best for him---if he takes anything else he needs 2 rounds of it.  She will also start him on align once daily.  TP please advise since SN is out of the office today.  Thanks  Allergies  Allergen Reactions  . Penicillins     REACTION: Allergic to PCN w/ throat swelling \\T \ hives

## 2012-08-11 MED ORDER — MOXIFLOXACIN HCL 400 MG PO TABS: 400.0000 mg | ORAL_TABLET | Freq: Every day | ORAL | Status: DC

## 2012-08-11 NOTE — Telephone Encounter (Signed)
Pt's wife returned call & asked to be reached at [phone] before noon today if possible.  Antionette Fairy

## 2012-08-11 NOTE — Telephone Encounter (Signed)
Called spoke with patient's wife, advised of pending abx script.  Rx sent to verified pharmacy.  Pt's wife is aware to call if symptoms do not improve or worsen.  Nothing further needed at this time; will sign off.

## 2012-08-31 ENCOUNTER — Other Ambulatory Visit (HOSPITAL_COMMUNITY): Payer: Self-pay | Admitting: *Deleted

## 2012-08-31 DIAGNOSIS — I6529 Occlusion and stenosis of unspecified carotid artery: Secondary | ICD-10-CM

## 2012-09-19 ENCOUNTER — Encounter (HOSPITAL_COMMUNITY): Payer: 59

## 2012-09-21 ENCOUNTER — Telehealth: Payer: Self-pay | Admitting: Pulmonary Disease

## 2012-09-26 ENCOUNTER — Telehealth: Payer: Self-pay | Admitting: Pulmonary Disease

## 2012-09-26 NOTE — Telephone Encounter (Signed)
Called and spoke w Shirley,patient wife.  She is requesting labs for pts office visit on 10/23/12 be placed by 10/09/12 since she will be off this day and can have patient come in and have blood drawn.  Dr. Kriste Basque please advise as to what labs should be ordered.  Thank You

## 2012-09-26 NOTE — Telephone Encounter (Signed)
Prior msg from 09/21/12 closed by error:  Call Documentation     Lazarus Salines, RN 09/26/2012 9:06 AM Signed  Called and spoke w Shirley,patient wife. She is requesting labs for pts office visit on 10/23/12 be placed by 10/09/12 since she will be off this day and can have patient come in and have blood drawn. Dr. Kriste Basque please advise as to what labs should be ordered. Thank You

## 2012-09-27 ENCOUNTER — Encounter (HOSPITAL_COMMUNITY): Payer: 59

## 2012-09-27 ENCOUNTER — Other Ambulatory Visit: Payer: Self-pay | Admitting: Pulmonary Disease

## 2012-09-27 DIAGNOSIS — D126 Benign neoplasm of colon, unspecified: Secondary | ICD-10-CM

## 2012-09-27 DIAGNOSIS — E7800 Pure hypercholesterolemia, unspecified: Secondary | ICD-10-CM

## 2012-09-27 DIAGNOSIS — K5730 Diverticulosis of large intestine without perforation or abscess without bleeding: Secondary | ICD-10-CM

## 2012-09-27 DIAGNOSIS — I10 Essential (primary) hypertension: Secondary | ICD-10-CM

## 2012-09-27 DIAGNOSIS — F411 Generalized anxiety disorder: Secondary | ICD-10-CM

## 2012-09-27 DIAGNOSIS — N320 Bladder-neck obstruction: Secondary | ICD-10-CM

## 2012-09-27 NOTE — Telephone Encounter (Signed)
Patient informed of lab order

## 2012-09-27 NOTE — Telephone Encounter (Signed)
LMTC x 1  

## 2012-09-27 NOTE — Telephone Encounter (Signed)
Lab order is in the computer for the pt on 12/23.  thanks

## 2012-10-03 ENCOUNTER — Other Ambulatory Visit: Payer: Self-pay | Admitting: Pulmonary Disease

## 2012-10-04 ENCOUNTER — Telehealth: Payer: Self-pay | Admitting: Pulmonary Disease

## 2012-10-04 MED ORDER — HYDROCODONE-ACETAMINOPHEN 5-500 MG PO TABS: ORAL_TABLET | ORAL | Status: DC

## 2012-10-04 MED ORDER — ALPRAZOLAM 0.5 MG PO TABS: 0.5000 mg | ORAL_TABLET | Freq: Three times a day (TID) | ORAL | Status: DC | PRN

## 2012-10-04 NOTE — Telephone Encounter (Signed)
[facility]

## 2012-10-04 NOTE — Telephone Encounter (Signed)
rx printed and placed on SN cart to be signed.

## 2012-10-05 NOTE — Telephone Encounter (Signed)
rx have been faxed back to the pharmacy

## 2012-10-09 ENCOUNTER — Other Ambulatory Visit (INDEPENDENT_AMBULATORY_CARE_PROVIDER_SITE_OTHER): Payer: 59

## 2012-10-09 DIAGNOSIS — I10 Essential (primary) hypertension: Secondary | ICD-10-CM

## 2012-10-09 DIAGNOSIS — N320 Bladder-neck obstruction: Secondary | ICD-10-CM

## 2012-10-09 DIAGNOSIS — F411 Generalized anxiety disorder: Secondary | ICD-10-CM

## 2012-10-09 DIAGNOSIS — K5730 Diverticulosis of large intestine without perforation or abscess without bleeding: Secondary | ICD-10-CM

## 2012-10-09 DIAGNOSIS — E7800 Pure hypercholesterolemia, unspecified: Secondary | ICD-10-CM

## 2012-10-09 LAB — CBC WITH DIFFERENTIAL/PLATELET
Basophils Relative: 0.3 % (ref 0.0–3.0)
Eosinophils Relative: 0.6 % (ref 0.0–5.0)
Lymphocytes Relative: 25.5 % (ref 12.0–46.0)
MCV: 84.1 fl (ref 78.0–100.0)
Monocytes Relative: 7 % (ref 3.0–12.0)
Neutrophils Relative %: 66.6 % (ref 43.0–77.0)
RBC: 4.79 Mil/uL (ref 4.22–5.81)
WBC: 13.1 10*3/uL — ABNORMAL HIGH (ref 4.5–10.5)

## 2012-10-09 LAB — BASIC METABOLIC PANEL
BUN: 26 mg/dL — ABNORMAL HIGH (ref 6–23)
Chloride: 99 mEq/L (ref 96–112)
Creatinine, Ser: 1.1 mg/dL (ref 0.4–1.5)

## 2012-10-09 LAB — HEPATIC FUNCTION PANEL
ALT: 13 U/L (ref 0–53)
Total Bilirubin: 0.8 mg/dL (ref 0.3–1.2)
Total Protein: 6.1 g/dL (ref 6.0–8.3)

## 2012-10-09 LAB — LIPID PANEL
Cholesterol: 126 mg/dL (ref 0–200)
LDL Cholesterol: 57 mg/dL (ref 0–99)
Triglycerides: 177 mg/dL — ABNORMAL HIGH (ref 0.0–149.0)

## 2012-10-09 LAB — PSA: PSA: 1.4 ng/mL (ref 0.10–4.00)

## 2012-10-23 ENCOUNTER — Encounter: Payer: Self-pay | Admitting: Pulmonary Disease

## 2012-10-23 ENCOUNTER — Ambulatory Visit (INDEPENDENT_AMBULATORY_CARE_PROVIDER_SITE_OTHER): Payer: 59 | Admitting: Pulmonary Disease

## 2012-10-23 ENCOUNTER — Telehealth: Payer: Self-pay | Admitting: Pulmonary Disease

## 2012-10-23 ENCOUNTER — Ambulatory Visit (INDEPENDENT_AMBULATORY_CARE_PROVIDER_SITE_OTHER)
Admission: RE | Admit: 2012-10-23 | Discharge: 2012-10-23 | Disposition: A | Discharge status: Home / Self Care | Payer: 59 | Source: Ambulatory Visit | Attending: Pulmonary Disease | Admitting: Pulmonary Disease

## 2012-10-23 DIAGNOSIS — I679 Cerebrovascular disease, unspecified: Secondary | ICD-10-CM

## 2012-10-23 DIAGNOSIS — E669 Obesity, unspecified: Secondary | ICD-10-CM

## 2012-10-23 DIAGNOSIS — I10 Essential (primary) hypertension: Secondary | ICD-10-CM

## 2012-10-23 DIAGNOSIS — M1990 Unspecified osteoarthritis, unspecified site: Secondary | ICD-10-CM

## 2012-10-23 DIAGNOSIS — J449 Chronic obstructive pulmonary disease, unspecified: Secondary | ICD-10-CM

## 2012-10-23 DIAGNOSIS — I4891 Unspecified atrial fibrillation: Secondary | ICD-10-CM

## 2012-10-23 DIAGNOSIS — E7800 Pure hypercholesterolemia, unspecified: Secondary | ICD-10-CM

## 2012-10-23 DIAGNOSIS — I2510 Atherosclerotic heart disease of native coronary artery without angina pectoris: Secondary | ICD-10-CM

## 2012-10-23 DIAGNOSIS — Z950 Presence of cardiac pacemaker: Secondary | ICD-10-CM

## 2012-10-23 DIAGNOSIS — N320 Bladder-neck obstruction: Secondary | ICD-10-CM

## 2012-10-23 DIAGNOSIS — I739 Peripheral vascular disease, unspecified: Secondary | ICD-10-CM

## 2012-10-23 DIAGNOSIS — M545 Low back pain: Secondary | ICD-10-CM

## 2012-10-23 DIAGNOSIS — F411 Generalized anxiety disorder: Secondary | ICD-10-CM

## 2012-10-23 DIAGNOSIS — K5730 Diverticulosis of large intestine without perforation or abscess without bleeding: Secondary | ICD-10-CM

## 2012-10-23 DIAGNOSIS — D126 Benign neoplasm of colon, unspecified: Secondary | ICD-10-CM

## 2012-10-23 MED ORDER — BUDESONIDE-FORMOTEROL FUMARATE 160-4.5 MCG/ACT IN AERO: 2.0000 | INHALATION_SPRAY | Freq: Two times a day (BID) | RESPIRATORY_TRACT | Status: DC

## 2012-10-23 MED ORDER — ALPRAZOLAM 0.5 MG PO TABS: 0.5000 mg | ORAL_TABLET | Freq: Three times a day (TID) | ORAL | Status: DC | PRN

## 2012-10-23 MED ORDER — HYDROCODONE-ACETAMINOPHEN 5-325 MG PO TABS: ORAL_TABLET | ORAL | Status: DC

## 2012-10-23 MED ORDER — MECLIZINE HCL 25 MG PO TABS: ORAL_TABLET | ORAL | Status: DC

## 2012-10-23 MED ORDER — TIOTROPIUM BROMIDE MONOHYDRATE 18 MCG IN CAPS: 18.0000 ug | ORAL_CAPSULE | Freq: Every day | RESPIRATORY_TRACT | Status: DC

## 2012-10-23 MED ORDER — MONTELUKAST SODIUM 10 MG PO TABS: 10.0000 mg | ORAL_TABLET | Freq: Every day | ORAL | Status: DC

## 2012-10-23 MED ORDER — FLUTICASONE PROPIONATE 50 MCG/ACT NA SUSP: 1.0000 | Freq: Two times a day (BID) | NASAL | Status: DC

## 2012-10-23 MED ORDER — PREDNISONE 10 MG PO TABS: ORAL_TABLET | ORAL | Status: DC

## 2012-10-23 NOTE — Patient Instructions (Addendum)
Today we updated your med list in our EPIC system...    Continue your current medications the same...    We refilled your meds per request...  Today we reviewed your recent fasting blood work & gave you a copy for your records...  We also did your follow up CXR & we will call you w this result when avail...  Let's get on track w/ our low carb low fat diet...    The goal is to lose 15-20 lbs!!!  Call for any questions...  Let's plan a follow up in 6 months.Marland KitchenMarland Kitchen

## 2012-10-23 NOTE — Telephone Encounter (Signed)
pts wife called and she is aware that the [facility].  Nothing further is needed.

## 2012-10-23 NOTE — Progress Notes (Signed)
Subjective:    Patient ID: Todd Kelly, male    DOB: May 16, 1931, 79 y.o.   MRN: [id number]  HPI 79 y/o WM here for a follow up visit... he has mult med problems including:  COPD/ AB/ ex-smoker;  HBP/ ASHD/ AFib/ Pacer- followed by DrSolomon/ [facility];  Cerebrovasc & peripheral vasc disease;  Hyperlipidemia/ Obesity;  DJD/ LBP;  Anxiety, etc...  ~  April 10, 2012:  21mo ROV & Todd Kelly has had a stable 21mo interval just c/o fatigue, lack of energy, etc; he is followed regularly for Cards by DrCroitoru- last seen 4/13 & his note is reviewed> Meds have been adjusted w/ LASIX increased then cut back down- he has lost 16+lbs w/ this diuresis & he is going to continue the Lasix40 therefore we will discontinue his HCT12.5.Marland KitchenMarland Kitchen SEE MED RECON BELOW>>    COPD> on Pred10mg -1alt w/ 1/2 qod, Symbicort160, Spiriva, Singulair; breathing is improved= min wheezing w/ exertion, mild cough, beige sput, DOE w/o change, no edema; [facility] & he is asked to walk on the other days...    HBP> on Lopressor25Bid, Cozaar100, Lasix40/d now, KCl20/d ; BP= 122/68 & similar at home; denies CP, palpit, syncope, ch in SOB, edema...    CAD, s/p CABG, Cardiomyopathy w/ EF=35-40%> on ASA + the above; denies angina & only [facility] he says; CHF improved after vigorous diuresis by DrCroitoru w/ Lasix80Bid, then slowly tapered & now at 40mg /d...    AFib, Pacer> on Coumadin + the above; Coumadin per [facility] CC==> recently changed from Coumadin to XARELTO 20mg /d...    Cerebrovasc dis> on ASA & Xarelto as noted; followed by [facility]; pt states last CDoppler was 10/11 & reported "OK", he denies cerebral ischemic symptoms.    ASPVD> on ASA & Xarelto as noted; followed by [facility] w/ prev right fem art thrombectomy & right RA stent...    Chol> on Simva80, FishOil, CoQ10; FLP 12/12 shows TChol 156, TG 195, HDL 42, LDL 75; needs better low fat diet & wt reduction...    Obesity> weight= 246# which represents a  16# wt reduction over 21mo; we reviewed diet/ exercise/ wt reduction strategies; great job!!!    GI> Divertics, Polyps, Hems> off Prilosec & on Ranitadine 150mg  Bid; on Miralax, Colase, AnusolHC; obese protuberant abd; denies pain, N/ V/ D/ C/ blood etc...    DJD, LBP> on Vicodin & averages 1-2 daily...    Anxiety> on Xanax and averages 1/2 tab bid... We reviewed prob list, meds, xrays and labs> see below>> We do not have recent labs from [facility]...  ~  October 23, 2012:  21mo ROV & Todd Kelly has had a good interval w/o new complaints or concerns- he notes good days and bad;  He did not bring meds to the OV today... We reviewed the following medical problems during today's office visit >>     COPD> on Pred10mg -1/2 qd, Symbicort160, Spiriva, Singulair; breathing is improved= min wheezing w/ exertion, mild cough, beige sput, DOE w/o change, no edema; [facility] [place] [facility] [place] [facility] [place] & he is asked to walk on the other days...    HBP> on Lopressor25Bid, Cozaar100, Lasix40Bid now, KCl20/d ; BP= 134/72 & similar at home; denies CP, palpit, syncope, ch in SOB, edema...    CAD, s/p CABG, Cardiomyopathy w/ EF=35-40%> on ASA + the above; denies angina & only exercise [place] [facility] he says; CHF improved after vigorous diuresis by DrCroitoru w/ Lasix80Bid,  then slowly tapered to 40mg Bid...    AFib, Pacer> on Xarelto + the above; Coumadin changed to Xarelto per [facility] CC==> stable w/o bleeding problems etc...    Cerebrovasc dis> on ASA & Xarelto as noted; followed by [facility]; pt states last CDoppler was 10/11 & reported "OK", he denies cerebral ischemic symptoms.    ASPVD> on ASA & Xarelto as noted; followed by [facility] w/ prev right fem art thrombectomy & right RA stent...    Chol> on Simva80, FishOil, CoQ10; FLP 12/13 shows TChol 126, TG 177, HDL 34, LDL 57; needs better low fat diet & wt reduction...    Obesity> weight= 245# stable; we reviewed diet/ exercise/ wt reduction strategies...    GI> Divertics,  Polyps, Hems> off Prilosec & on Ranitadine 150mg Bid; on Miralax, Colase, AnusolHC; obese protuberant abd; denies pain, N/ V/ D/ C/ blood etc...    DJD, LBP> on Vicodin & averages 1-2 daily...    Anxiety> on Xanax and averages 1/2 tab bid... We reviewed prob list, meds, xrays and labs> see below for updates >> he had the 2013 Flu vaccine 11/13; requests refill Rx today...  CXR 1/14 showed mod cardiomeg, s/p CABG & pacer, clear lungs, mild DJD spine... LABS 12/13:  FLP- fair on Simva80 w/ TG=177 HDL=34;  Chems- ok x BS=125;  CBC- wnl;  TSH=1.94;  PSA=1.40          Problem List:  BRONCHITIS, CHRONIC, ACUTE EXACERBATION (ICD-491.21) >> see prev notes... COPD (ICD-496) - ex-smoker, freq flairs when off Rx... now taking: SYMBICORT 160- 2spBid,  SPIRIVA daily, SINGULAIR 10mg /d,  MUCINEX 1-2 Bid w/ fluids, + FLONASE... ~  baseline CXR shows COPD, prev median sternotomy, NAD.Marland KitchenMarland Kitchen  ~   CT CHEST 4/09 w/ extensive coronary calcif, prior CABG, no signif abn in the lungs. ~  PFT's 4/09 showed FVC=2.88 (71%), FEV1=2.13 (67%), FEV1/ FVC= 74%, mid-flows= 51%pred... ~  CXR 3/10 showed sl cardiomeg, pleural thickening, pacer, DJD sp, NAD. ~  CXR 12/11 showed s/p CABG, pacer on left, borderline cardiomeg, basilar scarring, NAD. ~  CXR 4/12 showed s/p CABG, pacer, extrapleural fat deposition bilat, NAD...  OBSTRUCTIVE SLEEP APNEA (ICD-327.23) - he does not tolerate his CPAP...  HYPERTENSION (ICD-401.9) controlled on meds:  METOPROLOL 25mg Bid, COZAAR 100/d, LASIX 40mg /d; BP= 122/68 today 7 he denies CP, palpit, SOB, edema...  ARTERIOSCLEROTIC HEART DISEASE >> s/p CABG 2005 ISCHEMIC CARDIOMYOPATHY >> EF=35-40% ~  he is on ASA 81mg /d,  Now XARELTO 20mg /d, + meds as listed above... ~   CT CHEST 4/09 w/ extensive coronary calcif, prior CABG, no signif abn in the lungs. ~  cath 4/09 showed patent grafts, some disease in Circ & RCA, patent stent to left renal art... med Rx.  ~  2DEcho 5/11 showed mod conc LVH, mod  reduced LVF w/ EF= 35-40% w/ apical AK, dil RA... ~  pt reports Nuclear Stress Test after 10/11 OV w/ DrSolomon was OK.  ATRIAL FIBRILLATION & Tachy-Brady Syndrome >> COMPLETE HEART BLOCK & CARDIAC PACEMAKER IN SITU >> ~  he had syncope w/ 4 fx right ribs and CHB & AFib requiring pacer & Coumadin started... f/u by DrSolomon [facility] + DrCroitoru... f/u pacer checks OK & no changes made...  CEREBROVASCULAR DISEASE (ICD-437.9) - S/P Bilat CAE's 2000 & doppler's are followed by DrBerry...  ~  last CDoppler reported by [facility] 4/09 showed mod distal right CCA stenosis. ~  pt reports CDopplers done after 10/11 OV w/ DrSolomon was "OK" ~  DrC note of 4/13 indicates last  CDoppler w/ 50-69%restenosis on the right;  He is due for repeat CDoppler soon...  PERIPHERAL VASCULAR DISEASE (ICD-443.9) - S/P Rt fem art thrombectomy 2000, and Rt renal art stent 2002... he has all this followed by [facility] now & last doppler & cath showed patent stent.  HYPERCHOLESTEROLEMIA (ICD-272.0) - controlled on SIMVASTATIN 80mg /d & Fish Oil and labs followed by DrBerry (pt will ask for copies to be sent to [place])... he recently added NIASPAN for HDL of 31, but this was stopped due to flushing/ intol...  ~  FLP 12/09 showed TChol 138, TG 220, HDL 34, LDL 60 ~  FLP 12/10 showed TChol 132, TG 253, HDL 33, LDL 61 ~  FLP 12/11 showed TChol 144, TG 200, HDL 33, LDL 71... advised low fat diet & get weight down ~  FLP 12/12 on Simva80 showed  TChol 156, TG 195, HDL 42, LDL 75; needs better low fat diet & wt reduction...  OBESITY (ICD-278.00) - weight fluctuates betw 240-250# & he has been unable to lose weight... he is sedentary and we discussed an exercise program for him, since he is limited by LBP. ~  NOTE:  labs 12/09 showed BS= 131, HgA1c= 6.1.[name]. advised low carb diet, get wt down! ~  6/10: wt today= 249#, but pt states down to 233# at home- "just decr portions" ~  12/10:  wt today= 252# post holiday... he states "I'm starving myself to  death"... ~  24-Apr-2023:  weight = 252#.[name]. states he's lost 4" at the waist... ~  12/11:  weight = 252# ~  4/12:  Weight = 256# ~  6/12:  Weight = 262# ~  12/12:  Weight = 262# ~  6/13:  Weight = 246#  DIVERTICULOSIS OF COLON (ICD-562.10) COLONIC POLYPS (ICD-211.3) HEMORRHOIDS (ICD-455.6) ~  last colonoscopy was 12/07 by DrStark and showed divertic, polyps 2-36mm size (adenomas), and hems... f/u planned 35yrs. ~  CT Abd&Pelvis 7/10 showed several sm gallstones in gallbladder, bilat renal cysts, 1.7cm left adrenal adenoma & 9mm right adrenal lesion, advanced atherosclerotic changes in distalAo & branches, sm bilat inguinal hernias containing fat; DDD & old healed right rib fx.  DEGENERATIVE JOINT DISEASE (ICD-715.90) LOW BACK PAIN, CHRONIC (ICD-724.2) ~  he uses DCN 100 for pain and prev requested refill of #360 for 3 month supply... ~  12/10: DCN now off the market & Rx for Vicodin #270- up to 3/d as needed for pain. ~  12/12:  He is c/o incr back pain & he doesn't want to f/u w/ DrApling  ANXIETY (ICD-300.00) - he uses ALPRAZOLAM 0.5mg  Tid and prev requested #270 for 3 month supply...   Past Surgical History  Procedure Date  . Pta to right common femoral artery 2001    Dr. [name]  . Carotid endarterectomy 2002    bilateral.  Dr. Hart [place]  . Coronary artery bypass graft 2005  . Pacemaker placement July 2009    for CHB  Dr. Allyson Sabal.    Outpatient Encounter Prescriptions as of 10/23/2012  Medication Sig Dispense Refill  . ALPRAZolam (XANAX) 0.5 MG tablet Take 1 tablet (0.5 mg total) by mouth 3 (three) times daily as needed for anxiety. Not to exceed 3 per day.  270 tablet  1  . aspirin 81 MG tablet Take 81 mg by mouth daily.        . budesonide-formoterol (SYMBICORT) 160-4.5 MCG/ACT inhaler Inhale 2 puffs into the lungs 2 (two) times daily.  3 Inhaler  3  . Coenzyme Q10 (COQ10)  100 MG CAPS Take 1 capsule by mouth daily.        [name] docusate sodium (COLACE) 100 MG capsule Take 2 tabs by mouth once  daily       . fluticasone (FLONASE) 50 MCG/ACT nasal spray Place 1 spray into the nose 2 (two) times daily.  48 g  3  . furosemide (LASIX) 40 MG tablet Take as directed by Dr. Salena Saner.      . guaiFENesin (MUCINEX) 600 MG 12 hr tablet Take 1 to 2 tabs by mouth two times a day with fluids for thick phlegm       . HYDROcodone-acetaminophen (VICODIN) 5-500 MG per tablet Take 1 tab by mouth every 8 to 12 hours as needed for pain. Not to exceed 3 per day.  270 tablet  1  . hydrocortisone (PROCTOSOL HC) 2.5 % rectal cream as directed.        [name] losartan (COZAAR) 100 MG tablet Take 100 mg by mouth daily.        . meclizine (ANTIVERT) 25 MG tablet Take 1 tablet (25 mg total) by mouth 3 (three) times daily as needed. Take 1/2 to 1 tab by mouth every 6 hours as needed for dizziness.  90 tablet  0  . meclizine (ANTIVERT) 25 MG tablet TAKE ONE-HALF TO ONE TABLET EVERY 6 HOURS AS NEEDED FOR DIZZINESS  90 tablet  2  . metoprolol (LOPRESSOR) 50 MG tablet Take 1/2 tablet by mouth two times daily by Dr. Salena Saner.      . montelukast (SINGULAIR) 10 MG tablet Take 1 tablet (10 mg total) by mouth at bedtime.  90 tablet  3  . moxifloxacin (AVELOX) 400 MG tablet Take 1 tablet (400 mg total) by mouth daily.  7 tablet  0  . Omega-3 Fatty Acids (FISH OIL) 1000 MG CAPS Take 1 capsule by mouth daily.        . polyethylene glycol (MIRALAX / GLYCOLAX) packet Take 17 g by mouth daily.        . potassium chloride SA (K-DUR,KLOR-CON) 20 MEQ tablet Take 20 mEq by mouth daily.      . predniSONE (DELTASONE) 10 MG tablet Take as directed  125 tablet  3  . ranitidine (ZANTAC) 150 MG capsule Take 150 mg by mouth 2 (two) times daily.      . Rivaroxaban (XARELTO PO) Take 20 mg by mouth daily.       . simvastatin (ZOCOR) 80 MG tablet Take 80 mg by mouth at bedtime.        [name] tiotropium (SPIRIVA) 18 MCG inhalation capsule Place 1 capsule (18 mcg total) into inhaler and inhale daily.  90 capsule  3    Allergies  Allergen Reactions  . Penicillins      REACTION: Allergic to PCN w/ throat swelling \\T \ hives    Current Medications, Allergies, Past Medical History, Past Surgical History, Family History, and Social History were reviewed in [employer] record.    Review of Systems       See HPI - all other systems neg except as noted...      The patient complains of dyspnea on exertion.  The patient denies anorexia, fever, weight loss, weight gain, vision loss, decreased hearing, hoarseness, chest pain, syncope, peripheral edema, prolonged cough, headaches, hemoptysis, abdominal pain, melena, hematochezia, severe indigestion/heartburn, hematuria, incontinence, muscle weakness, suspicious skin lesions, transient blindness, difficulty walking, depression, unusual weight change, abnormal bleeding, enlarged lymph nodes, and angioedema.     Objective:  Physical Exam     WD, Obese, 79 y/o WM in NAD... GENERAL:  Alert & oriented; pleasant & cooperative... HEENT:  [place]/AT, EOM-full, PERRLA, TMs- distal wax blocking left TM, NOSE-clear, THROAT- sl red w/o exud... NECK:  Supple w/ fair ROM; no JVD; s/p bilat CAE's w/ bruits; no thyromegaly or nodules palpated; no lymphadenopathy. CHEST:  decr BS bilat w/ few scat rhonchi, no rales, no signs of consolidation... HEART:  Regular Rhythm; without murmurs/ rubs/ or gallops- median sternotomy scar...pacemaker in place ABDOMEN:  Obese, soft & nontender; normal bowel sounds; no organomegaly or masses detected. EXT: warm and dry, mild arthritic changes; no varicose veins/ +venous insuffic/ no edema. NEURO:  CN's intact; no focal neuro deficits...  DERM: no lesions seen, no rash etc...  RADIOLOGY DATA:  Reviewed in the EPIC EMR & discussed w/ the patient...  LABORATORY DATA:  Reviewed in the EPIC EMR & discussed w/ the patient...   Assessment & Plan:    COPD/ Chr Bronchitis>  Improved w/ the Pred at10mg  alt w/ 5mg  Qod; continue this and his other meds: Symbicort, Spiriva, Singulair,  Mucinex, etc...  HBP, CHF w/ combined sys & dias>  Fair control on meds, adjusted recently by [facility] w/ incr Lasix, continue same for now but he desperately needs to lose weight...  ASHD/ AFib/ Pacer>  Followed by DrCroitoru [facility] & stable; on Coumadin via their CC...  Cerebrovasc & Peripheral vasc dis>  Also followed regularly by [facility] (last note 4/12- we don't have their Doppler studies)...  CHOL>  He remains on Simva80 from [facility], FLP shows sl incr TG as before> advised low fat diet, weight reduction, incr exercise ...  Obesity>  Still hasn't been able to lose any wt; we reviewed diet, exercise, wt reduction program...  DJD>  Feeling better w/ the Pred, uses Vicodin as needed...  Anxiety>  Stable on Alpraz Prn...   Patient's Medications  New Prescriptions   HYDROCODONE-ACETAMINOPHEN (NORCO/VICODIN) 5-325 MG PER TABLET    Take 1/2 to 1 tablet by mouth three times daily as needed for pain  Previous Medications   ASPIRIN 81 MG TABLET    Take 81 mg by mouth daily.     COENZYME Q10 (COQ10) 100 MG CAPS    Take 1 capsule by mouth daily.     DOCUSATE SODIUM (COLACE) 100 MG CAPSULE    Take 2 tabs by mouth once daily    FUROSEMIDE (LASIX) 40 MG TABLET    Take 40 mg by mouth 2 (two) times daily. Per  Dr. Salena Saner.   GUAIFENESIN (MUCINEX) 600 MG 12 HR TABLET    Take 1 to 2 tabs by mouth two times a day with fluids for thick phlegm    HYDROCORTISONE (PROCTOSOL HC) 2.5 % RECTAL CREAM    as directed.     LOSARTAN (COZAAR) 100 MG TABLET    Take 100 mg by mouth daily.     METOPROLOL TARTRATE (LOPRESSOR) 25 MG TABLET    Take 25 mg by mouth 2 (two) times daily. Per Dr. Ulyses Southward FATTY ACIDS (FISH OIL) 1000 MG CAPS    Take 1 capsule by mouth daily.     POLYETHYLENE GLYCOL (MIRALAX / GLYCOLAX) PACKET    Take 17 g by mouth daily.     POTASSIUM CHLORIDE SA (K-DUR,KLOR-CON) 20 MEQ TABLET    Take 20 mEq by mouth daily.   RANITIDINE (ZANTAC) 150 MG CAPSULE    Take 150 mg by mouth 2 (two) times daily.  RIVAROXABAN  (XARELTO PO)    Take 20 mg by mouth daily.    SIMVASTATIN (ZOCOR) 80 MG TABLET    Take 80 mg by mouth at bedtime.    Modified Medications   Modified Medication Previous Medication   ALPRAZOLAM (XANAX) 0.5 MG TABLET ALPRAZolam (XANAX) 0.5 MG tablet      Take 1 tablet (0.5 mg total) by mouth 3 (three) times daily as needed for anxiety. Not to exceed 3 per day.    Take 1 tablet (0.5 mg total) by mouth 3 (three) times daily as needed for anxiety. Not to exceed 3 per day.   BUDESONIDE-FORMOTEROL (SYMBICORT) 160-4.5 MCG/ACT INHALER budesonide-formoterol (SYMBICORT) 160-4.5 MCG/ACT inhaler      Inhale 2 puffs into the lungs 2 (two) times daily.    Inhale 2 puffs into the lungs 2 (two) times daily.   FLUTICASONE (FLONASE) 50 MCG/ACT NASAL SPRAY fluticasone (FLONASE) 50 MCG/ACT nasal spray      Place 1 spray into the nose 2 (two) times daily.    Place 1 spray into the nose 2 (two) times daily.   MECLIZINE (ANTIVERT) 25 MG TABLET meclizine (ANTIVERT) 25 MG tablet      Take 1/2 to 1 tablet by mouth every 6 hours as needed for dizziness    TAKE ONE-HALF TO ONE TABLET EVERY 6 HOURS AS NEEDED FOR DIZZINESS   MONTELUKAST (SINGULAIR) 10 MG TABLET montelukast (SINGULAIR) 10 MG tablet      Take 1 tablet (10 mg total) by mouth at bedtime.    Take 1 tablet (10 mg total) by mouth at bedtime.   PREDNISONE (DELTASONE) 10 MG TABLET predniSONE (DELTASONE) 10 MG tablet      Take as directed    Take as directed   TIOTROPIUM (SPIRIVA) 18 MCG INHALATION CAPSULE tiotropium (SPIRIVA) 18 MCG inhalation capsule      Place 1 capsule (18 mcg total) into inhaler and inhale daily.    Place 1 capsule (18 mcg total) into inhaler and inhale daily.  Discontinued Medications   HYDROCODONE-ACETAMINOPHEN (VICODIN) 5-500 MG PER TABLET    Take 1 tab by mouth every 8 to 12 hours as needed for pain. Not to exceed 3 per day.   MECLIZINE (ANTIVERT) 25 MG TABLET    Take 1 tablet (25 mg total) by mouth 3 (three) times daily as needed. Take 1/2 to  1 tab by mouth every 6 hours as needed for dizziness.   METOPROLOL (LOPRESSOR) 50 MG TABLET    Take 25 mg by mouth 2 (two) times daily. Per  by Dr. Salena Saner.   MOXIFLOXACIN (AVELOX) 400 MG TABLET    Take 1 tablet (400 mg total) by mouth daily.

## 2012-10-26 NOTE — Progress Notes (Signed)
Quick Note:  Patient's wife returned call. Advised of lab results / recs as stated by TP. Pt's wife verbalized understanding and denied any questions. ______

## 2012-10-27 ENCOUNTER — Telehealth: Payer: Self-pay | Admitting: Pulmonary Disease

## 2012-10-27 MED ORDER — HYDROCODONE-ACETAMINOPHEN 5-325 MG PO TABS: ORAL_TABLET | ORAL | Status: DC

## 2012-10-27 MED ORDER — ALPRAZOLAM 0.5 MG PO TABS: 0.5000 mg | ORAL_TABLET | Freq: Three times a day (TID) | ORAL | Status: DC | PRN

## 2012-10-27 NOTE — Telephone Encounter (Signed)
i called and spoke with pts wife and she stated that the wrong # was sent in for the alprazolam and the hydrocodone.  i called and spoke with pharmacy and they stated that i would need to send in new rx for both.  This has been printed out and will fax back once signed by SN.

## 2013-01-04 ENCOUNTER — Other Ambulatory Visit (HOSPITAL_COMMUNITY): Payer: Self-pay | Admitting: Cardiovascular Disease

## 2013-01-26 ENCOUNTER — Other Ambulatory Visit: Payer: Self-pay | Admitting: Pulmonary Disease

## 2013-02-05 ENCOUNTER — Encounter: Payer: Self-pay | Admitting: *Deleted

## 2013-02-12 ENCOUNTER — Encounter: Payer: Self-pay | Admitting: *Deleted

## 2013-02-15 ENCOUNTER — Encounter: Payer: Self-pay | Admitting: Cardiovascular Disease

## 2013-03-20 ENCOUNTER — Telehealth: Payer: Self-pay | Admitting: Pulmonary Disease

## 2013-03-20 MED ORDER — PREDNISONE 5 MG PO TABS: 5.0000 mg | ORAL_TABLET | Freq: Every day | ORAL | Status: DC

## 2013-03-20 NOTE — Telephone Encounter (Signed)
Called and spoke with pts wife and she is requesting that the rx for the prednisone be changed to the 5 mg tablets since this is the dose that the pt has been taking.  She stated that they currently have the 10 mg tablets and these are from a new manufacturer and they are too small to cut in half.  She is aware that new rx has been sent in to the mail order per her request and she stated the pt is aware of his appt with SN on 7/8.  Nothing further is needed.

## 2013-04-02 ENCOUNTER — Encounter (HOSPITAL_COMMUNITY): Payer: 59

## 2013-04-10 ENCOUNTER — Ambulatory Visit (HOSPITAL_COMMUNITY)
Admission: RE | Admit: 2013-04-10 | Discharge: 2013-04-10 | Disposition: A | Discharge status: Home / Self Care | Payer: 59 | Source: Ambulatory Visit | Attending: Cardiovascular Disease | Admitting: Cardiovascular Disease

## 2013-04-10 ENCOUNTER — Ambulatory Visit: Payer: 59 | Admitting: Cardiovascular Disease

## 2013-04-10 DIAGNOSIS — I679 Cerebrovascular disease, unspecified: Secondary | ICD-10-CM

## 2013-04-10 DIAGNOSIS — I6529 Occlusion and stenosis of unspecified carotid artery: Secondary | ICD-10-CM

## 2013-04-10 DIAGNOSIS — I739 Peripheral vascular disease, unspecified: Secondary | ICD-10-CM | POA: Insufficient documentation

## 2013-04-10 NOTE — Progress Notes (Signed)
Carotid Duplex Completed. Axavier Pressley, RDMS, RVT  

## 2013-04-24 ENCOUNTER — Other Ambulatory Visit: Payer: Self-pay | Admitting: Pulmonary Disease

## 2013-04-24 ENCOUNTER — Encounter: Payer: Self-pay | Admitting: Pulmonary Disease

## 2013-04-24 ENCOUNTER — Ambulatory Visit (INDEPENDENT_AMBULATORY_CARE_PROVIDER_SITE_OTHER): Payer: 59 | Admitting: Pulmonary Disease

## 2013-04-24 ENCOUNTER — Other Ambulatory Visit (INDEPENDENT_AMBULATORY_CARE_PROVIDER_SITE_OTHER): Payer: 59

## 2013-04-24 VITALS — BP 120/68 | HR 76 | Temp 98.0°F | Ht 68.0 in | Wt 210.8 lb

## 2013-04-24 DIAGNOSIS — I739 Peripheral vascular disease, unspecified: Secondary | ICD-10-CM

## 2013-04-24 DIAGNOSIS — I679 Cerebrovascular disease, unspecified: Secondary | ICD-10-CM

## 2013-04-24 DIAGNOSIS — R7309 Other abnormal glucose: Secondary | ICD-10-CM

## 2013-04-24 DIAGNOSIS — I10 Essential (primary) hypertension: Secondary | ICD-10-CM

## 2013-04-24 DIAGNOSIS — I4891 Unspecified atrial fibrillation: Secondary | ICD-10-CM

## 2013-04-24 DIAGNOSIS — I255 Ischemic cardiomyopathy: Secondary | ICD-10-CM

## 2013-04-24 DIAGNOSIS — E7800 Pure hypercholesterolemia, unspecified: Secondary | ICD-10-CM

## 2013-04-24 DIAGNOSIS — I2510 Atherosclerotic heart disease of native coronary artery without angina pectoris: Secondary | ICD-10-CM

## 2013-04-24 DIAGNOSIS — Z950 Presence of cardiac pacemaker: Secondary | ICD-10-CM

## 2013-04-24 DIAGNOSIS — F411 Generalized anxiety disorder: Secondary | ICD-10-CM

## 2013-04-24 DIAGNOSIS — M545 Low back pain: Secondary | ICD-10-CM

## 2013-04-24 DIAGNOSIS — E663 Overweight: Secondary | ICD-10-CM | POA: Insufficient documentation

## 2013-04-24 DIAGNOSIS — J449 Chronic obstructive pulmonary disease, unspecified: Secondary | ICD-10-CM

## 2013-04-24 DIAGNOSIS — I2589 Other forms of chronic ischemic heart disease: Secondary | ICD-10-CM

## 2013-04-24 DIAGNOSIS — M1990 Unspecified osteoarthritis, unspecified site: Secondary | ICD-10-CM

## 2013-04-24 LAB — LIPID PANEL: Total CHOL/HDL Ratio: 3

## 2013-04-24 LAB — HEPATIC FUNCTION PANEL
ALT: 11 U/L (ref 0–53)
Alkaline Phosphatase: 64 U/L (ref 39–117)
Bilirubin, Direct: 0.1 mg/dL (ref 0.0–0.3)
Total Protein: 6.7 g/dL (ref 6.0–8.3)

## 2013-04-24 LAB — BASIC METABOLIC PANEL
CO2: 28 mEq/L (ref 19–32)
Chloride: 106 mEq/L (ref 96–112)
Sodium: 142 mEq/L (ref 135–145)

## 2013-04-24 MED ORDER — ALPRAZOLAM 0.5 MG PO TABS: 0.5000 mg | ORAL_TABLET | Freq: Three times a day (TID) | ORAL | Status: DC | PRN

## 2013-04-24 MED ORDER — HYDROCODONE-ACETAMINOPHEN 5-325 MG PO TABS: ORAL_TABLET | ORAL | Status: DC

## 2013-04-24 NOTE — Progress Notes (Signed)
Subjective:    Patient ID: Todd Kelly, male    DOB: [birth year], 79 y.o.   MRN: [id number]  HPI 79 y/o WM here for a follow up visit... he has mult med problems including:  COPD/ AB/ ex-smoker;  HBP/ ASHD/ AFib/ Pacer- followed by DrSolomon/ [facility];  Cerebrovasc & peripheral vasc disease;  Hyperlipidemia/ Obesity;  DJD/ LBP;  Anxiety, etc...  ~  April 10, 2012:  63mo ROV & Todd Kelly has had a stable 63mo interval just c/o fatigue, lack of energy, etc; he is followed regularly for Cards by DrCroitoru- last seen 4/13 & his note is reviewed> Meds have been adjusted w/ LASIX increased then cut back down- he has lost 16+lbs w/ this diuresis & he is going to continue the Lasix40 therefore we will discontinue his HCT12.5.Marland KitchenMarland Kitchen SEE MED RECON BELOW>>    COPD> on Pred10mg -1alt w/ 1/2 qod, Symbicort160, Spiriva, Singulair; breathing is improved= min wheezing w/ exertion, mild cough, beige sput, DOE w/o change, no edema; [facility] & he is asked to walk on the other days...    HBP> on Lopressor25Bid, Cozaar100, Lasix40/d now, KCl20/d ; BP= 122/68 & similar at home; denies CP, palpit, syncope, ch in SOB, edema...    CAD, s/p CABG, Cardiomyopathy w/ EF=35-40%> on ASA + the above; denies angina & only exercise [facility] he says; CHF improved after vigorous diuresis by DrCroitoru w/ Lasix80Bid, then slowly tapered & now at 40mg /d...    AFib, Pacer> on Coumadin + the above; Coumadin per [facility] CC==> recently changed from Coumadin to XARELTO 20mg /d...    Cerebrovasc dis> on ASA & Xarelto as noted; followed by [facility]; pt states last CDoppler was 10/11 & reported "OK", he denies cerebral ischemic symptoms.    ASPVD> on ASA & Xarelto as noted; followed by [facility] w/ prev right fem art thrombectomy & right RA stent...    Chol> on Simva80, FishOil, CoQ10; FLP 12/12 shows TChol 156, TG 195, HDL 42, LDL 75; needs better low fat diet & wt reduction...    Obesity> weight= 246# which represents a  16# wt reduction over 63mo; we reviewed diet/ exercise/ wt reduction strategies; great job!!!    GI> Divertics, Polyps, Hems> off Prilosec & on Ranitadine 150mg  Bid; on Miralax, Colase, AnusolHC; obese protuberant abd; denies pain, N/ V/ D/ C/ blood etc...    DJD, LBP> on Vicodin & averages 1-2 daily...    Anxiety> on Xanax and averages 1/2 tab bid... We reviewed prob list, meds, xrays and labs> see below>> We do not have recent labs from [facility]...  ~  October 23, 2012:  63mo ROV & Todd Kelly has had a good interval w/o new complaints or concerns- he notes good days and bad;  He did not bring meds to the OV today... We reviewed the following medical problems during today's office visit >>     COPD> on Pred10mg -1/2 qd, Symbicort160, Spiriva, Singulair; breathing is improved= min wheezing w/ exertion, mild cough, beige sput, DOE w/o change, no edema; [facility] & he is asked to walk on the other days...    HBP> on Lopressor25Bid, Cozaar100, Lasix40Bid now, KCl20/d ; BP= 134/72 & similar at home; denies CP, palpit, syncope, ch in SOB, edema...    CAD, s/p CABG, Cardiomyopathy w/ EF=35-40%> on ASA + the above; denies angina & only exercise [facility] he says; CHF improved after vigorous diuresis by DrCroitoru w/ Lasix80Bid,  then slowly tapered to 40mg Bid...    AFib, Pacer> on Xarelto + the above; Coumadin changed to Xarelto per [facility] CC==> stable w/o bleeding problems etc...    Cerebrovasc dis> on ASA & Xarelto as noted; followed by [facility]; pt states last CDoppler was 10/11 & reported "OK", he denies cerebral ischemic symptoms.    ASPVD> on ASA & Xarelto as noted; followed by [facility] w/ prev right fem art thrombectomy & right RA stent...    Chol> on Simva80, FishOil, CoQ10; FLP 12/13 shows TChol 126, TG 177, HDL 34, LDL 57; needs better low fat diet & wt reduction...    Obesity> weight= 245# stable; we reviewed diet/ exercise/ wt reduction strategies...    GI> Divertics,  Polyps, Hems> off Prilosec & on Ranitadine 150mg Bid; on Miralax, Colase, AnusolHC; obese protuberant abd; denies pain, N/ V/ D/ C/ blood etc...    DJD, LBP> on Vicodin & averages 1-2 daily...    Anxiety> on Xanax and averages 1/2 tab bid... We reviewed prob list, meds, xrays and labs> see below for updates >> he had the 2013 Flu vaccine 11/13; requests refill Rx today...  CXR 1/14 showed mod cardiomeg, s/p CABG & pacer, clear lungs, mild DJD spine... LABS 12/13:  FLP- fair on Simva80 w/ TG=177 HDL=34;  Chems- ok x BS=125;  CBC- wnl;  TSH=1.94;  PSA=1.40  ~  April 25, 2103:  74mo ROV & Todd Kelly has done an incredible job w/ diet, exercise & wt reduction- weight down 23# to 211# today (35# over the last yr)!  Assoc w/ this he says he feels 100% better, more energy, no CP, palpit, SOB, edema, etc... He says that DrC at [facility] has reset his dry wt & adjusted his diuretics- on HCT 12.5 daily & he takes Lasix40 if he gains 3-4# on his scales... He is c/o a tender knot in the lower part of his chest- and exam shows this is his Xyphoid process (he couldn't feel it when he was 35# heavier)... He has had incr trouble w/ his knees & received series of 3 shots, using a walker at present, but says he's not a surg candidate (this could be reassessed in light of his wt loss & improved CV status)...     He was seen 3/14 by DrCroitoru> improved and no changes made to regimen... We reviewed prob list, meds, xrays and labs> see below for updates >>  LABS 7/14:  FLP- at goals on Simva80 & tol well;  Chems- wnl w/ Cr=1.0.Marland KitchenMarland Kitchen          Problem List:  BRONCHITIS, CHRONIC, ACUTE EXACERBATION (ICD-491.21) >> see prev notes... COPD (ICD-496) - ex-smoker, freq flairs when off Rx... now taking: SYMBICORT 160- 2spBid,  SPIRIVA daily, SINGULAIR 10mg /d,  MUCINEX 1-2 Bid w/ fluids, + FLONASE... ~  baseline CXR shows COPD, prev median sternotomy, NAD.Marland KitchenMarland Kitchen  ~   CT CHEST 4/09 w/ extensive coronary calcif, prior CABG, no signif abn in the  lungs. ~  PFT's 4/09 showed FVC=2.88 (71%), FEV1=2.13 (67%), FEV1/ FVC= 74%, mid-flows= 51%pred... ~  CXR 3/10 showed sl cardiomeg, pleural thickening, pacer, DJD sp, NAD. ~  CXR 12/11 showed s/p CABG, pacer on left, borderline cardiomeg, basilar scarring, NAD. ~  CXR 4/12 showed s/p CABG, pacer, extrapleural fat deposition bilat, NAD.[name]. ~  CXR 1/14 showed mod cardiomeg, prev median sternotomy, pacer, clear lungs & DJD spine... ~  7/14:  Pred is down to 5mg /d & he is much improved w/ the wt reduction...  OBSTRUCTIVE SLEEP APNEA (  ICD-327.23) - he does not tolerate his CPAP...  HYPERTENSION (ICD-401.9) controlled on meds:  METOPROLOL 25mg Bid, COZAAR 100/d, LASIX 40mg /d; BP= 120/68 today & he denies CP, palpit, SOB, edema...  ARTERIOSCLEROTIC HEART DISEASE >> s/p CABG 2005 ISCHEMIC CARDIOMYOPATHY >> EF=35-40% ~  he is on ASA 81mg /d,  Now XARELTO 20mg /d, + meds as listed above... ~   CT CHEST 4/09 w/ extensive coronary calcif, prior CABG, no signif abn in the lungs. ~  cath 4/09 showed patent grafts, some disease in Circ & RCA, patent stent to left renal art... med Rx.  ~  2DEcho 5/11 showed mod conc LVH, mod reduced LVF w/ EF= 35-40% w/ apical AK, dil RA... ~  Nuclear Stress Test 11/11 showed a mild perfusion defect inferiorly consistent w/ a scar, global LVF is reduced w/ EF= 38%, dyssynchony due to RV apical pacing...  ATRIAL FIBRILLATION & Tachy-Brady Syndrome >> COMPLETE HEART BLOCK & CARDIAC PACEMAKER IN SITU >> ~  he had syncope w/ 4 fx right ribs and CHB & AFib requiring pacer & Coumadin started... f/u by DrSolomon [facility] + DrCroitoru... f/u pacer checks OK & no changes made...  CEREBROVASCULAR DISEASE (ICD-437.9) - S/P Bilat CAE's 2000 & doppler's are followed by DrBerry...  ~  last CDoppler reported by [facility] 4/09 showed mod distal right CCA stenosis. ~  pt reports CDopplers done after 10/11 OV w/ DrSolomon was "OK" ~  DrC note of 4/13 indicates last CDoppler w/ 50-69%restenosis on the  right;  He is due for repeat CDoppler soon...  PERIPHERAL VASCULAR DISEASE (ICD-443.9) - S/P Rt fem art thrombectomy 2000, and Rt renal art stent 2002... he has all this followed by [facility] now & last doppler & cath showed patent stent.  HYPERCHOLESTEROLEMIA (ICD-272.0) - controlled on SIMVASTATIN 80mg /d & Fish Oil and labs followed by DrBerry (pt will ask for copies to be sent to [place])... he recently added NIASPAN for HDL of 31, but this was stopped due to flushing/ intol...  ~  FLP 12/09 showed TChol 138, TG 220, HDL 34, LDL 60 ~  FLP 12/10 showed TChol 132, TG 253, HDL 33, LDL 61 ~  FLP 12/11 showed TChol 144, TG 200, HDL 33, LDL 71... advised low fat diet & get weight down ~  FLP 12/12 on Simva80 showed  TChol 156, TG 195, HDL 42, LDL 75; needs better low fat diet & wt reduction...  OBESITY (ICD-278.00) - weight fluctuates betw 240-250# & he has been unable to lose weight... he is sedentary and we discussed an exercise program for him, since he is limited by LBP. ~  NOTE:  labs 12/09 showed BS= 131, HgA1c= 6.1.[name]. advised low carb diet, get wt down! ~  6/10: wt today= 249#, but pt states down to 233# at home- "just decr portions" ~  12/10:  wt today= 252# post holiday... he states "I'm starving myself to death"... ~  Apr 05, 2023:  weight = 252#.[name]. states he's lost 4" at the waist... ~  12/11:  weight = 252# ~  4/12:  Weight = 256# ~  6/12:  Weight = 262# ~  12/12:  Weight = 262# ~  6/13:  Weight = 246# ~  7/14:  Weight = 211#  DIVERTICULOSIS OF COLON (ICD-562.10) COLONIC POLYPS (ICD-211.3) HEMORRHOIDS (ICD-455.6) ~  last colonoscopy was 12/07 by DrStark and showed divertic, polyps 2-61mm size (adenomas), and hems... f/u planned 8yrs. ~  CT Abd&Pelvis 7/10 showed several sm gallstones in gallbladder, bilat renal cysts, 1.7cm left adrenal adenoma & 9mm  right adrenal lesion, advanced atherosclerotic changes in distalAo & branches, sm bilat inguinal hernias containing fat; DDD & old healed right rib  fx.  DEGENERATIVE JOINT DISEASE (ICD-715.90) LOW BACK PAIN, CHRONIC (ICD-724.2) ~  he uses DCN 100 for pain and prev requested refill of #360 for 3 month supply... ~  12/10: DCN now off the market & Rx for Vicodin #270- up to 3/d as needed for pain. ~  12/12:  He is c/o incr back pain & he doesn't want to f/u w/ DrApling  ANXIETY (ICD-300.00) - he uses ALPRAZOLAM 0.5mg  Tid and prev requested #270 for 3 month supply...   Past Surgical History  Procedure Laterality Date  . Pta to right common femoral artery  2001    Dr. [name]  . Carotid endarterectomy  2002    bilateral.  Dr. Hart Rochester  . Coronary artery bypass graft  2005  . Pacemaker placement  July 2009    for CHB  Dr. Allyson Sabal. [employer]  . Appendectomy  1948  . US echocardiography  11/06/2011    Mod. LVH w/mod. depressed systolic function,EF 35-40%,gobal LV hypokinesis,mildly dilated LA,mild aortic root dilatation  . Nm myoview ltd  09/04/2010    mild perfusion defect basal inferior,mid inferior, & apical inferior regions. LV systolic function deteriorated since 2006  . Cardiac catheterization  02/02/2008    patent grafts    Outpatient Encounter Prescriptions as of 04/24/2013  Medication Sig Dispense Refill  . ALPRAZolam (XANAX) 0.5 MG tablet Take 1 tablet (0.5 mg total) by mouth 3 (three) times daily as needed for anxiety. Not to exceed 3 per day.  270 tablet  1  . aspirin 81 MG tablet Take 81 mg by mouth daily.        . Coenzyme Q10 (COQ10) 100 MG CAPS Take 1 capsule by mouth daily.        [name] docusate sodium (COLACE) 100 MG capsule Take 2 tabs by mouth once daily       . fluticasone (FLONASE) 50 MCG/ACT nasal spray Place 1 spray into the nose 2 (two) times daily.  48 g  3  . furosemide (LASIX) 40 MG tablet Take 40 mg by mouth 2 (two) times daily. Per  Dr. Salena Saner.      . guaiFENesin (MUCINEX) 600 MG 12 hr tablet Take 1 to 2 tabs by mouth two times a day with fluids for thick phlegm       . hydrochlorothiazide (MICROZIDE) 12.5 MG capsule Take  12.5 mg by mouth daily.      [name] HYDROcodone-acetaminophen (NORCO/VICODIN) 5-325 MG per tablet Take 1/2 to 1 tablet by mouth three times daily as needed for pain  270 tablet  1  . losartan (COZAAR) 100 MG tablet Take 100 mg by mouth daily.        . meclizine (ANTIVERT) 25 MG tablet Take 1/2 to 1 tablet by mouth every 6 hours as needed for dizziness  90 tablet  3  . metoprolol tartrate (LOPRESSOR) 25 MG tablet Take 25 mg by mouth 2 (two) times daily. Per Dr. Salena Saner      . montelukast (SINGULAIR) 10 MG tablet Take 1 tablet (10 mg total) by mouth at bedtime.  90 tablet  3  . Omega-3 Fatty Acids (FISH OIL) 1000 MG CAPS Take 1 capsule by mouth daily.        . polyethylene glycol (MIRALAX / GLYCOLAX) packet Take 17 g by mouth daily.        . potassium chloride SA (  K-DUR,KLOR-CON) 20 MEQ tablet Take 20 mEq by mouth daily.      . predniSONE (DELTASONE) 5 MG tablet Take 1 tablet (5 mg total) by mouth daily.  90 tablet  3  . ranitidine (ZANTAC) 150 MG capsule Take 150 mg by mouth 2 (two) times daily.      . Rivaroxaban (XARELTO PO) Take 20 mg by mouth daily.       . simvastatin (ZOCOR) 80 MG tablet Take 80 mg by mouth at bedtime.        [name] tiotropium (SPIRIVA) 18 MCG inhalation capsule Place 1 capsule (18 mcg total) into inhaler and inhale daily.  90 capsule  3  . vitamin C (ASCORBIC ACID) 500 MG tablet Take 500 mg by mouth daily.      . budesonide-formoterol (SYMBICORT) 160-4.5 MCG/ACT inhaler Inhale 2 puffs into the lungs 2 (two) times daily.  3 Inhaler  3  . [DISCONTINUED] hydrocortisone (PROCTOSOL HC) 2.5 % rectal cream as directed.         No facility-administered encounter medications on file as of 04/24/2013.    Allergies  Allergen Reactions  . Penicillins     REACTION: Allergic to PCN w/ throat swelling \\T \ hives    Current Medications, Allergies, Past Medical History, Past Surgical History, Family History, and Social History were reviewed in [employer] record.    Review of  Systems       See HPI - all other systems neg except as noted...      The patient complains of dyspnea on exertion.  The patient denies anorexia, fever, weight loss, weight gain, vision loss, decreased hearing, hoarseness, chest pain, syncope, peripheral edema, prolonged cough, headaches, hemoptysis, abdominal pain, melena, hematochezia, severe indigestion/heartburn, hematuria, incontinence, muscle weakness, suspicious skin lesions, transient blindness, difficulty walking, depression, unusual weight change, abnormal bleeding, enlarged lymph nodes, and angioedema.     Objective:   Physical Exam     WD, Obese, 79 y/o WM in NAD... GENERAL:  Alert & oriented; pleasant & cooperative... HEENT:  [place]/AT, EOM-full, PERRLA, TMs- distal wax blocking left TM, NOSE-clear, THROAT- sl red w/o exud... NECK:  Supple w/ fair ROM; no JVD; s/p bilat CAE's w/ bruits; no thyromegaly or nodules palpated; no lymphadenopathy. CHEST:  decr BS bilat w/ few scat rhonchi, no rales, no signs of consolidation... HEART:  Regular Rhythm; without murmurs/ rubs/ or gallops- median sternotomy scar...pacemaker in place ABDOMEN:  Obese, soft & nontender; normal bowel sounds; no organomegaly or masses detected. EXT: warm and dry, mild arthritic changes; no varicose veins/ +venous insuffic/ no edema. NEURO:  CN's intact; no focal neuro deficits...  DERM: no lesions seen, no rash etc...  RADIOLOGY DATA:  Reviewed in the EPIC EMR & discussed w/ the patient...  LABORATORY DATA:  Reviewed in the EPIC EMR & discussed w/ the patient...   Assessment & Plan:  HE IS MUCH IMPROVED w/ WEIGHT REDUCTION!!!  COPD/ Chr Bronchitis>  Improved w/ the Pred at 5mg  Qd; continue this and his other meds: Symbicort, Spiriva, Singulair, Mucinex, etc...  HBP, CHF w/ combined sys & dias>  Fair control on meds, adjusted recently by [facility] w/ incr Lasix, continue same for now but he desperately needs to lose weight...  ASHD/ AFib/ Pacer>  Followed by  DrCroitoru [facility] & stable; on Coumadin via their CC...  Cerebrovasc & Peripheral vasc dis>  Also followed regularly by [facility] (last note 4/12- we don't have their Doppler studies)...  CHOL>  He remains on Simva80 from [facility],  FLP shows sl incr TG as before> advised low fat diet, weight reduction, incr exercise ...  Obesity>  Still hasn't been able to lose any wt; we reviewed diet, exercise, wt reduction program...  DJD>  Feeling better w/ the Pred, uses Vicodin as needed...  Anxiety>  Stable on Alpraz Prn...   Patient's Medications  New Prescriptions   No medications on file  Previous Medications   ASPIRIN 81 MG TABLET    Take 81 mg by mouth daily.     BUDESONIDE-FORMOTEROL (SYMBICORT) 160-4.5 MCG/ACT INHALER    Inhale 2 puffs into the lungs 2 (two) times daily.   COENZYME Q10 (COQ10) 100 MG CAPS    Take 1 capsule by mouth daily.     DOCUSATE SODIUM (COLACE) 100 MG CAPSULE    Take 2 tabs by mouth once daily    FLUTICASONE (FLONASE) 50 MCG/ACT NASAL SPRAY    Place 1 spray into the nose 2 (two) times daily.   FUROSEMIDE (LASIX) 40 MG TABLET    Take as needed for swelling Per  Dr. Salena Saner.   GUAIFENESIN (MUCINEX) 600 MG 12 HR TABLET    Take 1 to 2 tabs by mouth two times a day with fluids for thick phlegm    HYDROCHLOROTHIAZIDE (MICROZIDE) 12.5 MG CAPSULE    Take 12.5 mg by mouth daily.   LOSARTAN (COZAAR) 100 MG TABLET    Take 100 mg by mouth daily.     MECLIZINE (ANTIVERT) 25 MG TABLET    Take 1/2 to 1 tablet by mouth every 6 hours as needed for dizziness   METOPROLOL TARTRATE (LOPRESSOR) 25 MG TABLET    Take 25 mg by mouth 2 (two) times daily. Per Dr. Salena Saner   MONTELUKAST (SINGULAIR) 10 MG TABLET    Take 1 tablet (10 mg total) by mouth at bedtime.   OMEGA-3 FATTY ACIDS (FISH OIL) 1000 MG CAPS    Take 1 capsule by mouth daily.     POLYETHYLENE GLYCOL (MIRALAX / GLYCOLAX) PACKET    Take 17 g by mouth daily.     POTASSIUM CHLORIDE SA (K-DUR,KLOR-CON) 20 MEQ TABLET    Take 20 mEq by mouth daily.    PREDNISONE (DELTASONE) 5 MG TABLET    Take 1 tablet (5 mg total) by mouth daily.   RANITIDINE (ZANTAC) 150 MG CAPSULE    Take 150 mg by mouth 2 (two) times daily.   RIVAROXABAN (XARELTO PO)    Take 20 mg by mouth daily.    SIMVASTATIN (ZOCOR) 80 MG TABLET    Take 80 mg by mouth at bedtime.     TIOTROPIUM (SPIRIVA) 18 MCG INHALATION CAPSULE    Place 1 capsule (18 mcg total) into inhaler and inhale daily.   VITAMIN C (ASCORBIC ACID) 500 MG TABLET    Take 500 mg by mouth daily.  Modified Medications   Modified Medication Previous Medication   ALPRAZOLAM (XANAX) 0.5 MG TABLET ALPRAZolam (XANAX) 0.5 MG tablet      Take 1 tablet (0.5 mg total) by mouth 3 (three) times daily as needed for anxiety. Not to exceed 3 per day.    Take 1 tablet (0.5 mg total) by mouth 3 (three) times daily as needed for anxiety. Not to exceed 3 per day.   HYDROCODONE-ACETAMINOPHEN (NORCO/VICODIN) 5-325 MG PER TABLET HYDROcodone-acetaminophen (NORCO/VICODIN) 5-325 MG per tablet      Take 1/2 to 1 tablet by mouth three times daily as needed for pain    Take 1/2 to 1 tablet by  mouth three times daily as needed for pain  Discontinued Medications   HYDROCORTISONE (PROCTOSOL HC) 2.5 % RECTAL CREAM    as directed.

## 2013-04-24 NOTE — Patient Instructions (Addendum)
Today we updated your med list in our EPIC system...    Continue your current medications the same...  We refilled the meds you requested...  Today we did your follow up blood work...    We will contact you w/ the results when available...   Keep up the fabulous job w/ diet & exercise- CONGRATS!!!  Call for any questions...  Let's plan a follow up visit in 8mo, sooner if needed for problems.Marland KitchenMarland Kitchen

## 2013-05-04 ENCOUNTER — Ambulatory Visit (INDEPENDENT_AMBULATORY_CARE_PROVIDER_SITE_OTHER): Payer: 59 | Admitting: Cardiovascular Disease

## 2013-05-04 ENCOUNTER — Other Ambulatory Visit: Payer: Self-pay | Admitting: Cardiovascular Disease

## 2013-05-04 ENCOUNTER — Encounter: Payer: Self-pay | Admitting: Cardiovascular Disease

## 2013-05-04 VITALS — BP 112/64 | HR 76 | Resp 16 | Ht 69.0 in | Wt 213.7 lb

## 2013-05-04 DIAGNOSIS — I679 Cerebrovascular disease, unspecified: Secondary | ICD-10-CM

## 2013-05-04 DIAGNOSIS — I2589 Other forms of chronic ischemic heart disease: Secondary | ICD-10-CM

## 2013-05-04 DIAGNOSIS — I2510 Atherosclerotic heart disease of native coronary artery without angina pectoris: Secondary | ICD-10-CM

## 2013-05-04 DIAGNOSIS — I442 Atrioventricular block, complete: Secondary | ICD-10-CM

## 2013-05-04 DIAGNOSIS — Z950 Presence of cardiac pacemaker: Secondary | ICD-10-CM

## 2013-05-04 DIAGNOSIS — I255 Ischemic cardiomyopathy: Secondary | ICD-10-CM

## 2013-05-04 DIAGNOSIS — E7800 Pure hypercholesterolemia, unspecified: Secondary | ICD-10-CM

## 2013-05-04 DIAGNOSIS — I4891 Unspecified atrial fibrillation: Secondary | ICD-10-CM

## 2013-05-04 LAB — PACEMAKER DEVICE OBSERVATION

## 2013-05-04 NOTE — Patient Instructions (Addendum)
Your physician recommends that you weigh, daily, at the same time every day, and in the same amount of clothing. Please record your daily weights on the handout provided and bring it to your next appointment. You were doing a very good job of adjusting diuretic treatment for fluid retention. Continue the same program. Please call for unexpected weight gain but does not respond to diuretic therapy, shortness of breath while lying down or swelling of the ankles. Followup for a pacemaker check in 3 months. Follow up for visit with the physician in 6 months.

## 2013-05-07 DIAGNOSIS — I2510 Atherosclerotic heart disease of native coronary artery without angina pectoris: Secondary | ICD-10-CM | POA: Insufficient documentation

## 2013-05-07 DIAGNOSIS — I442 Atrioventricular block, complete: Secondary | ICD-10-CM | POA: Insufficient documentation

## 2013-05-07 NOTE — Assessment & Plan Note (Signed)
He is unaware of atrial fibrillation but he had a three-day episode that occurred in mid May and another brief episode that occurred July 3 (as recorded by his pacemaker). He is on anti-coagulation without any bleeding complications and without neurological symptoms to suggest stroke or TIA

## 2013-05-07 NOTE — Assessment & Plan Note (Addendum)
His most recent echocardiogram from January of 2013 shows an ejection fraction of 35-40%. Part of the reduction in systolic function is related to pacing induced dyssynchrony. He is on appropriate treatment with angiotensin receptor blockers and a selective beta blocker. Carvedilol is best avoided secondary to his lung problems. He has 100% ventricular pacing. If heart failure worsened he may benefit from upgrade to a CRT device. The patient and his wife are doing an excellent job with daily weight monitoring and diuretic dose adjustment for sudden weight gain. Notes difficulty in distinguishing COPD versus heart failure as a cause of his dyspnea. As his true weight diminishes with improved diet, his "dry weight" needs to be constantly reassessed. No changes are made in his standard daily medications for heart failure.

## 2013-05-07 NOTE — Assessment & Plan Note (Signed)
Carotid ultrasonography May 2013 shows both carotids with significant atherosclerotic changes. He has had bilateral endarterectomy performed roughly 14 years ago. There is 50-69% restenosis in the proximal right internal carotid artery and no significant obstruction the left internal carotid artery. If disease progresses she would be best suited for stenting rather than redo surgery. Yearly followup as indicated

## 2013-05-07 NOTE — Progress Notes (Signed)
Patient ID: Todd Kelly, male   DOB: 01-06-1931, 79 y.o.   MRN: [id number]     Reason for office visit Followup CAD, CHF, atrial fibrillation, complete heart block, pacemaker check  Todd Kelly has not had any serious health challenges since his last appointment. He has been paying more attention to his diet and has managed to lose quite a bit of weight. He is 30 pounds lighter than he was in November of last year. He and his wife have been very conscientious in monitoring his weights and have judiciously adjusted his diuretic. Even though we had established a dry weight of around 225 pounds at his previous appointment they understood that because of his true weight loss they needed to adjust his diuretic for weight trends rather than absolute weight.  He has not had syncope. He is pacemaker dependent secondary to complete heart block. His device is functioning normally.  As before he has problems with chronic shortness of breath and generally functional class 2-3. He has occasional problems with wheezing.    Allergies  Allergen Reactions  . Penicillins     REACTION: Allergic to PCN w/ throat swelling \\T \ hives    Current Outpatient Prescriptions  Medication Sig Dispense Refill  . ALPRAZolam (XANAX) 0.5 MG tablet Take 1 tablet (0.5 mg total) by mouth 3 (three) times daily as needed for anxiety. Not to exceed 3 per day.  270 tablet  1  . aspirin 81 MG tablet Take 81 mg by mouth daily.        . budesonide-formoterol (SYMBICORT) 160-4.5 MCG/ACT inhaler Inhale 2 puffs into the lungs 2 (two) times daily.  3 Inhaler  3  . Coenzyme Q10 (COQ10) 100 MG CAPS Take 1 capsule by mouth daily.        [name] diltiazem (CARDIZEM CD) 180 MG 24 hr capsule Take 180 mg by mouth daily.      [name] docusate sodium (COLACE) 100 MG capsule Take 2 tabs by mouth once daily       . fluticasone (FLONASE) 50 MCG/ACT nasal spray Place 1 spray into the nose 2 (two) times daily.  48 g  3  . furosemide (LASIX) 40 MG tablet  Take as needed for swelling Per  Dr. Salena Saner.      . guaiFENesin (MUCINEX) 600 MG 12 hr tablet Take 1 to 2 tabs by mouth two times a day with fluids for thick phlegm       . hydrochlorothiazide (MICROZIDE) 12.5 MG capsule Take 12.5 mg by mouth daily.      [name] HYDROcodone-acetaminophen (NORCO/VICODIN) 5-325 MG per tablet Take 1/2 to 1 tablet by mouth three times daily as needed for pain  270 tablet  1  . losartan (COZAAR) 100 MG tablet Take 100 mg by mouth daily.        . meclizine (ANTIVERT) 25 MG tablet Take 1/2 to 1 tablet by mouth every 6 hours as needed for dizziness  90 tablet  3  . metoprolol tartrate (LOPRESSOR) 25 MG tablet Take 25 mg by mouth 2 (two) times daily. Per Dr. Salena Saner      . montelukast (SINGULAIR) 10 MG tablet Take 1 tablet (10 mg total) by mouth at bedtime.  90 tablet  3  . Omega-3 Fatty Acids (FISH OIL) 1000 MG CAPS Take 1 capsule by mouth daily.        . polyethylene glycol (MIRALAX / GLYCOLAX) packet Take 17 g by mouth daily.        . potassium  chloride SA (K-DUR,KLOR-CON) 20 MEQ tablet Take 20 mEq by mouth daily.      . predniSONE (DELTASONE) 5 MG tablet Take 2.5 mg by mouth daily.      . ranitidine (ZANTAC) 150 MG capsule Take 150 mg by mouth 2 (two) times daily.      . Rivaroxaban (XARELTO PO) Take 20 mg by mouth daily.       . simvastatin (ZOCOR) 80 MG tablet Take 80 mg by mouth at bedtime.        [name] tiotropium (SPIRIVA) 18 MCG inhalation capsule Place 1 capsule (18 mcg total) into inhaler and inhale daily.  90 capsule  3  . vitamin C (ASCORBIC ACID) 500 MG tablet Take 500 mg by mouth daily.       No current facility-administered medications for this visit.    Past Medical History  Diagnosis Date  . OSA (obstructive sleep apnea)   . Bronchial pneumonia   . Bronchitis, chronic with acute exacerbation   . COPD (chronic obstructive pulmonary disease)   . Hypertension   . Arteriosclerotic heart disease   . Atrial fibrillation   . Cardiac pacemaker in situ 04/12/2008    St.Jude  Zephyr  . Cerebrovascular disease   . Peripheral vascular disease   . Hypercholesteremia   . Obesity   . Diverticulosis of colon   . Colon polyps   . Hemorrhoids   . DJD (degenerative joint disease)   . Chronic low back pain   . Anxiety   . CHF (congestive heart failure)   . CHB (complete heart block)   . Carotid arterial disease   . CAD (coronary artery disease)   . S/P CABG (coronary artery bypass graft) 2005    Past Surgical History  Procedure Laterality Date  . Pta to right common femoral artery  2001    Dr. [name]  . Carotid endarterectomy  2002    bilateral.  Dr. Hart Rochester  . Coronary artery bypass graft  2005  . Pacemaker placement  July 2009    for CHB  Dr. Allyson Sabal. St. Jude  . Appendectomy  1948  . US echocardiography  11/06/2011    Mod. LVH w/mod. depressed systolic function,EF 35-40%,gobal LV hypokinesis,mildly dilated LA,mild aortic root dilatation  . Nm myoview ltd  09/04/2010    mild perfusion defect basal inferior,mid inferior, & apical inferior regions. LV systolic function deteriorated since 2006  . Cardiac catheterization  02/02/2008    patent grafts    Family History  Problem Relation Age of Onset  . Asthma Paternal Uncle   . Heart disease Paternal Grandmother   . Heart disease Mother   . Heart attack Father   . Rheum arthritis Father   . Rheum arthritis Paternal Uncle   . Pancreatic cancer Mother   . Hyperlipidemia Mother     History   Social History  . Marital Status: Married    Spouse Name: Todd Kelly    Number of Children: N  . Years of Education: N/A   Occupational History  . [employer]     retired  . DRIVER    Social History Main Topics  . Smoking status: Former Smoker -- 1.50 packs/day for 54 years    Types: Cigarettes    Quit date: 10/18/1996  . Smokeless tobacco: Former Neurosurgeon    Types: Chew     Comment: only used chewing tobacco while working in the yard a couple of times  . Alcohol Use: No  . Drug Use: No  .  Sexually  Active: Not on file   Other Topics Concern  . Not on file   Social History Narrative   No alcohol.   Drinks 1 cup of caffeine.    Review of systems: The patient specifically denies any chest pain at rest or with exertion, dyspnea at rest, (at least class II dyspnea on exertion), orthopnea, paroxysmal nocturnal dyspnea, syncope, palpitations, focal neurological deficits, intermittent claudication,cough, hemoptysis. He has intermittent lower extremity edema.  The patient also denies abdominal pain, nausea, vomiting, dysphagia, diarrhea, constipation, polyuria, polydipsia, dysuria, hematuria, frequency, urgency, abnormal bleeding or bruising, fever, chills, unexpected weight changes, mood swings, change in skin or hair texture, change in voice quality, auditory or visual problems, allergic reactions or rashes, new musculoskeletal complaints other than usual "aches and pains".   PHYSICAL EXAM BP 112/64  Pulse 76  Resp 16  Ht 5\' 9"  (1.753 m)  Wt 96.934 kg (213 lb 11.2 oz)  BMI 31.54 kg/m2  General: Alert, oriented x3, no distress Head: no evidence of trauma, PERRL, EOMI, no exophtalmos or lid lag, no myxedema, no xanthelasma; normal ears, nose and oropharynx Neck: normal jugular venous pulsations and no hepatojugular reflux; brisk carotid pulses without delay and no carotid bruits Chest: Emphysematous chest clear to auscultation, distant heart sounds and diminished breath sounds throughout,no signs of consolidation by percussion or palpation, normal fremitus, symmetrical and full respiratory excursions, left subclavian pacemaker site Cardiovascular: normal position and quality of the apical impulse, regular rhythm, normal first and second heart sounds, no murmurs, rubs or gallops Abdomen: no tenderness or distention, no masses by palpation, no abnormal pulsatility or arterial bruits, normal bowel sounds, no hepatosplenomegaly Extremities: no clubbing, cyanosis; trace ankle edema; 2+ radial,  ulnar and brachial pulses bilaterally; 2+ right femoral, posterior tibial and dorsalis pedis pulses; 2+ left femoral, posterior tibial and dorsalis pedis pulses; no subclavian or femoral bruits Neurological: grossly nonfocal   EKG: Atrial sensed, ventricular paced rhythm with occasional atrial paced ventricular paced rhythm and occasional PVCs  Lipid Panel     Component Value Date/Time   CHOL 109 04/24/2013 0849   TRIG 99.0 04/24/2013 0849   HDL 35.70* 04/24/2013 0849   CHOLHDL 3 04/24/2013 0849   VLDL 19.8 04/24/2013 0849   LDLCALC 54 04/24/2013 0849    BMET    Component Value Date/Time   NA 142 04/24/2013 0849   K 4.1 04/24/2013 0849   CL 106 04/24/2013 0849   CO2 28 04/24/2013 0849   GLUCOSE 106* 04/24/2013 0849   GLUCOSE 109* 09/22/2006 1133   BUN 23 04/24/2013 0849   CREATININE 1.0 04/24/2013 0849   CALCIUM 9.0 04/24/2013 0849   GFRNONAA 91.54 10/14/2010 1008   GFRAA 94 10/01/2008 1027     ASSESSMENT AND PLAN Cardiomyopathy, ischemic His most recent echocardiogram from January of 2013 shows an ejection fraction of 35-40%. Part of the reduction in systolic function is related to pacing induced dyssynchrony. He is on appropriate treatment with angiotensin receptor blockers and a selective beta blocker. Carvedilol is best avoided secondary to his lung problems. He has 100% ventricular pacing. If heart failure worsened he may benefit from upgrade to a CRT device. The patient and his wife are doing an excellent job with daily weight monitoring and diuretic dose adjustment for sudden weight gain. Notes difficulty in distinguishing COPD versus heart failure as a cause of his dyspnea. As his true weight diminishes with improved diet, his "dry weight" needs to be constantly reassessed. No changes are made in his  standard daily medications for heart failure.  CAD (coronary artery disease) s/p CABG It is now almost 10 years since his bypass procedure (LIMA to LAD and two SVG to ramus intermedius 1 and ramus  intermedius 2). He does not have angina pectoris. Bypasses were patent at cardiac catheterization 2009. Nuclear stress testing in 2011 shows an extensive inferior wall scar without reversibility. This suggests that he has had interval occlusion of the dominant left circumflex coronary artery which had multiple sequential stenoses at the time of coronary driver 15 1610. He is free of angina and there was no reversibility on the nuclear stress test. After discussing the findings and 2011 we decided to proceed with conventional management. He remains free of angina and there has been no further reduction in left ventricular systolic function. His coronary risk factors are generally appropriately addressed.  ATRIAL FIBRILLATION, paroxysmal He is unaware of atrial fibrillation but he had a three-day episode that occurred in mid May and another brief episode that occurred July 3 (as recorded by his pacemaker). He is on anti-coagulation without any bleeding complications and without neurological symptoms to suggest stroke or TIA  Cardiac pacemaker in situ Mr. Fiumara has complete heart block and is pacemaker dependent, 100% ventricular paced. He also paces the atrium 59% of the time. His pacemaker to dual-chamber St. Jude zephyr XL device model 5826 implanted June 2009. Devices fully interrogated including active testing of pacing thresholds. Pacemaker function is normal. Battery voltage is 2.75 V, estimated longevity around 6-7 years. He truly impedance is 342 ohms sensed P waves 2.7 mV atrial pressures are comparable to 0.5 ms. Ventricular lead impedance is 392 ohms R waves undetectable threshold 1 V at 0.5 ms pulse width. Return for device check in 3 months and full office evaluation in 6 months  CEREBROVASCULAR DISEASE Carotid ultrasonography May 2013 shows both carotids with significant atherosclerotic changes. He has had bilateral endarterectomy performed roughly 14 years ago. There is 50-69% restenosis in  the proximal right internal carotid artery and no significant obstruction the left internal carotid artery. If disease progresses she would be best suited for stenting rather than redo surgery. Yearly followup as indicated  Complete heart block    HYPERCHOLESTEROLEMIA Recent lipid profile is good.   Orders Placed This Encounter  Procedures  . EKG 12-Lead   Meds ordered this encounter  Medications  . diltiazem (CARDIZEM CD) 180 MG 24 hr capsule    Sig: Take 180 mg by mouth daily.  . predniSONE (DELTASONE) 5 MG tablet    Sig: Take 2.5 mg by mouth daily.    Junious Silk, MD, [facility] [phone] office [phone] pager

## 2013-05-07 NOTE — Assessment & Plan Note (Signed)
Recent lipid profile is good.

## 2013-05-07 NOTE — Assessment & Plan Note (Signed)
It is now almost 10 years since his bypass procedure (LIMA to LAD and two SVG to ramus intermedius 1 and ramus intermedius 2). He does not have angina pectoris. Bypasses were patent at cardiac catheterization 2009. Nuclear stress testing in 2011 shows an extensive inferior wall scar without reversibility. This suggests that he has had interval occlusion of the dominant left circumflex coronary artery which had multiple sequential stenoses at the time of coronary driver 15 1914. He is free of angina and there was no reversibility on the nuclear stress test. After discussing the findings and 2011 we decided to proceed with conventional management. He remains free of angina and there has been no further reduction in left ventricular systolic function. His coronary risk factors are generally appropriately addressed.

## 2013-05-07 NOTE — Assessment & Plan Note (Signed)
Todd Kelly has complete heart block and is pacemaker dependent, 100% ventricular paced. He also paces the atrium 59% of the time. His pacemaker to dual-chamber St. Jude zephyr XL device model [id number] implanted June 2009. Devices fully interrogated including active testing of pacing thresholds. Pacemaker function is normal. Battery voltage is 2.75 V, estimated longevity around 6-7 years. He truly impedance is 342 ohms sensed P waves 2.7 mV atrial pressures are comparable to 0.5 ms. Ventricular lead impedance is 392 ohms R waves undetectable threshold 1 V at 0.5 ms pulse width. Return for device check in 3 months and full office evaluation in 6 months

## 2013-05-10 ENCOUNTER — Telehealth: Payer: Self-pay | Admitting: *Deleted

## 2013-05-10 DIAGNOSIS — I6521 Occlusion and stenosis of right carotid artery: Secondary | ICD-10-CM

## 2013-05-10 NOTE — Telephone Encounter (Signed)
Returning your call. °

## 2013-05-10 NOTE — Telephone Encounter (Signed)
Results of carotid doppler given to patient.  Voiced understanding

## 2013-05-10 NOTE — Telephone Encounter (Signed)
Message left carotid doppler needs to be repeated in 6 months to call for results.

## 2013-05-10 NOTE — Telephone Encounter (Signed)
Message copied by Vita Barley on Thu May 10, 2013  9:34 AM ------      Message from: Thurmon Fair      Created: Wed May 09, 2013  8:38 PM       Right carotid blockage has worsened since last year and should be reevaluated sooner (6 months). Not yet in surgery range, though. ------

## 2013-05-11 LAB — PACEMAKER DEVICE OBSERVATION
AL AMPLITUDE: 3.7 mv
BATTERY VOLTAGE: 2.75 V
RV LEAD IMPEDENCE PM: 392 Ohm
VENTRICULAR PACING PM: 100

## 2013-05-22 ENCOUNTER — Telehealth: Payer: Self-pay | Admitting: *Deleted

## 2013-05-22 ENCOUNTER — Encounter: Payer: Self-pay | Admitting: *Deleted

## 2013-05-22 DIAGNOSIS — I6529 Occlusion and stenosis of unspecified carotid artery: Secondary | ICD-10-CM

## 2013-05-22 NOTE — Telephone Encounter (Signed)
Message copied by Marella Bile on Tue May 22, 2013  9:02 AM ------      Message from: Runell Gess      Created: Wed May 09, 2013  6:29 PM       Increase in right bulb velocities. Repeat in 6 months ------

## 2013-07-16 ENCOUNTER — Telehealth: Payer: Self-pay | Admitting: Pulmonary Disease

## 2013-07-16 MED ORDER — MOXIFLOXACIN HCL 400 MG PO TABS: 400.0000 mg | ORAL_TABLET | Freq: Every day | ORAL | Status: DC

## 2013-07-16 NOTE — Telephone Encounter (Signed)
Called, spoke with pt's wife.  Reports pt has prod cough with light yellow mucus, increased SOB at rest and with exertion, and had chills yesterday.  Symptoms started on Saturday am.  No wheezing, chest tightness, or fever.  Pt is taking mucinex dm bid along with maintenance meds - Symbicort and spiriva.  Wife states pt usually needs avelox to get this cleared up.  Dr. Kriste Basque, pls advise. Thank you.  Last OV with SN:  04/24/2013  [facility] [address]  Allergies verified with wife: Allergies  Allergen Reactions  . Penicillins     REACTION: Allergic to PCN w/ throat swelling \\T \ hives     * Wife requesting this be called in PRIOR to 4:15 today.  This is when she gets off work and doesn't want to make another trip back into town.  Thanks!

## 2013-07-16 NOTE — Telephone Encounter (Signed)
Called, spoke with pt's wife.  Informed her of below per SN.  She verbalized understanding of this, is aware [facility], and is to call back if pt's symptoms do not improve or worsen and is to seek emergency care if needed.

## 2013-07-16 NOTE — Telephone Encounter (Signed)
Per SN---  Call in avelox 400 mg  #7  1 daily until gone.  thanks

## 2013-07-20 ENCOUNTER — Telehealth: Payer: Self-pay | Admitting: Pulmonary Disease

## 2013-07-20 NOTE — Telephone Encounter (Signed)
Pt decided to make an appt for Monday w/ TP instead of leaving a message.  Antionette Fairy

## 2013-07-23 ENCOUNTER — Other Ambulatory Visit (INDEPENDENT_AMBULATORY_CARE_PROVIDER_SITE_OTHER): Payer: 59

## 2013-07-23 ENCOUNTER — Ambulatory Visit (INDEPENDENT_AMBULATORY_CARE_PROVIDER_SITE_OTHER)
Admission: RE | Admit: 2013-07-23 | Discharge: 2013-07-23 | Disposition: A | Discharge status: Home / Self Care | Payer: 59 | Source: Ambulatory Visit | Attending: Adult Health | Admitting: Adult Health

## 2013-07-23 ENCOUNTER — Encounter: Payer: Self-pay | Admitting: Adult Health

## 2013-07-23 ENCOUNTER — Ambulatory Visit (INDEPENDENT_AMBULATORY_CARE_PROVIDER_SITE_OTHER): Payer: 59 | Admitting: Adult Health

## 2013-07-23 VITALS — BP 106/74 | HR 74 | Temp 96.8°F | Ht 69.0 in | Wt 210.8 lb

## 2013-07-23 DIAGNOSIS — J441 Chronic obstructive pulmonary disease with (acute) exacerbation: Secondary | ICD-10-CM

## 2013-07-23 DIAGNOSIS — R0989 Other specified symptoms and signs involving the circulatory and respiratory systems: Secondary | ICD-10-CM

## 2013-07-23 DIAGNOSIS — J189 Pneumonia, unspecified organism: Secondary | ICD-10-CM

## 2013-07-23 DIAGNOSIS — R0600 Dyspnea, unspecified: Secondary | ICD-10-CM

## 2013-07-23 DIAGNOSIS — R0609 Other forms of dyspnea: Secondary | ICD-10-CM

## 2013-07-23 DIAGNOSIS — J180 Bronchopneumonia, unspecified organism: Secondary | ICD-10-CM

## 2013-07-23 LAB — CBC WITH DIFFERENTIAL/PLATELET
Basophils Absolute: 0 10*3/uL (ref 0.0–0.1)
Eosinophils Absolute: 0 10*3/uL (ref 0.0–0.7)
HCT: 37.5 % — ABNORMAL LOW (ref 39.0–52.0)
Hemoglobin: 11.9 g/dL — ABNORMAL LOW (ref 13.0–17.0)
Lymphocytes Relative: 11.4 % — ABNORMAL LOW (ref 12.0–46.0)
Lymphs Abs: 1.1 10*3/uL (ref 0.7–4.0)
Monocytes Relative: 5.4 % (ref 3.0–12.0)
Neutro Abs: 7.8 10*3/uL — ABNORMAL HIGH (ref 1.4–7.7)
Neutrophils Relative %: 83.1 % — ABNORMAL HIGH (ref 43.0–77.0)
Platelets: 190 10*3/uL (ref 150.0–400.0)
RDW: 18.9 % — ABNORMAL HIGH (ref 11.5–14.6)

## 2013-07-23 LAB — BASIC METABOLIC PANEL
BUN: 19 mg/dL (ref 6–23)
Calcium: 8.4 mg/dL (ref 8.4–10.5)
Chloride: 101 mEq/L (ref 96–112)
GFR: 98.8 mL/min (ref >60.00)
Potassium: 4.1 mEq/L (ref 3.5–5.1)

## 2013-07-23 LAB — URINALYSIS, ROUTINE W REFLEX MICROSCOPIC
Bilirubin Urine: NEGATIVE
Ketones, ur: NEGATIVE
Total Protein, Urine: NEGATIVE
pH: 6 (ref 5.0–8.0)

## 2013-07-23 LAB — BRAIN NATRIURETIC PEPTIDE: Pro B Natriuretic peptide (BNP): 1155 pg/mL — ABNORMAL HIGH (ref 0.0–100.0)

## 2013-07-23 MED ORDER — MOXIFLOXACIN HCL 400 MG PO TABS: 400.0000 mg | ORAL_TABLET | Freq: Every day | ORAL | Status: DC

## 2013-07-23 MED ORDER — LEVALBUTEROL HCL 0.63 MG/3ML IN NEBU
0.6300 mg | INHALATION_SOLUTION | Freq: Once | RESPIRATORY_TRACT | Status: AC
  Administered 2013-07-23: 0.63 mg via RESPIRATORY_TRACT

## 2013-07-23 NOTE — Progress Notes (Signed)
Subjective:    Patient ID: Todd Kelly, male    DOB: 04-Jun-1931, 79 y.o.   MRN: [id number]  HPI 79 y/o WM here for a follow up visit... he has mult med problems including:  COPD/ AB/ ex-smoker;  HBP/ ASHD/ AFib/ Pacer- followed by DrSolomon/ [facility];  Cerebrovasc & peripheral vasc disease;  Hyperlipidemia/ Obesity;  DJD/ LBP;  Anxiety, etc...   07/23/2013 Acute OV  Complains of increased SOB, prod cough with clear/light brown mucus, weakness/fatigue x10days - just finished avelox x7 days -last dose yesterday. .  denies wheezing, chest tightness, hemoptysis, edema, f/c/s Feeling some better but still weak with congested cough and dyspnea. Coughing up light brown mucus.  On prednisone 5mg  daily .  Eating well. No orthopnea, lower ext edema.  CXR today shows New small right pleural effusion and trace left pleural  fluid noted with patchy bibasilar airspace opacities. No hemoptysis, n//v/d, chest pain , hemoptysis.  No fever .          Problem List:  BRONCHITIS, CHRONIC, ACUTE EXACERBATION (ICD-491.21) >> see prev notes... COPD (ICD-496) - ex-smoker, freq flairs when off Rx... now taking: SYMBICORT 160- 2spBid,  SPIRIVA daily, SINGULAIR 10mg /d,  MUCINEX 1-2 Bid w/ fluids, + FLONASE... ~  baseline CXR shows COPD, prev median sternotomy, NAD.Marland KitchenMarland Kitchen  ~   CT CHEST 4/09 w/ extensive coronary calcif, prior CABG, no signif abn in the lungs. ~  PFT's 4/09 showed FVC=2.88 (71%), FEV1=2.13 (67%), FEV1/ FVC= 74%, mid-flows= 51%pred... ~  CXR 3/10 showed sl cardiomeg, pleural thickening, pacer, DJD sp, NAD. ~  CXR 12/11 showed s/p CABG, pacer on left, borderline cardiomeg, basilar scarring, NAD. ~  CXR 4/12 showed s/p CABG, pacer, extrapleural fat deposition bilat, NAD.[name]. ~  CXR 1/14 showed mod cardiomeg, prev median sternotomy, pacer, clear lungs & DJD spine... ~  7/14:  Pred is down to 5mg /d & he is much improved w/ the wt reduction...  OBSTRUCTIVE SLEEP APNEA (ICD-327.23) - he does not tolerate his  CPAP...  HYPERTENSION (ICD-401.9) controlled on meds:  METOPROLOL 25mg Bid, COZAAR 100/d, LASIX 40mg /d; BP= 120/68 today & he denies CP, palpit, SOB, edema...  ARTERIOSCLEROTIC HEART DISEASE >> s/p CABG 2005 ISCHEMIC CARDIOMYOPATHY >> EF=35-40% ~  he is on ASA 81mg /d,  Now XARELTO 20mg /d, + meds as listed above... ~   CT CHEST 4/09 w/ extensive coronary calcif, prior CABG, no signif abn in the lungs. ~  cath 4/09 showed patent grafts, some disease in Circ & RCA, patent stent to left renal art... med Rx.  ~  2DEcho 5/11 showed mod conc LVH, mod reduced LVF w/ EF= 35-40% w/ apical AK, dil RA... ~  Nuclear Stress Test 11/11 showed a mild perfusion defect inferiorly consistent w/ a scar, global LVF is reduced w/ EF= 38%, dyssynchony due to RV apical pacing...  ATRIAL FIBRILLATION & Tachy-Brady Syndrome >> COMPLETE HEART BLOCK & CARDIAC PACEMAKER IN SITU >> ~  he had syncope w/ 4 fx right ribs and CHB & AFib requiring pacer & Coumadin started... [facility] + DrCroitoru... f/u pacer checks OK & no changes made...  CEREBROVASCULAR DISEASE (ICD-437.9) - S/P Bilat CAE's 2000 & doppler's are followed by DrBerry...  ~  last CDoppler [facility] by [facility] 4/09 showed mod distal right CCA stenosis. ~  pt reports CDopplers done after 10/11 OV w/ DrSolomon was "OK" ~  DrC note of 4/13 indicates last CDoppler w/ 50-69%restenosis on the right;  He is due for repeat CDoppler soon...  PERIPHERAL VASCULAR DISEASE (  ICD-443.9) - S/P Rt fem art thrombectomy 2000, and Rt renal art stent 2002... he has all this followed by [facility] now & last doppler & cath showed patent stent.  HYPERCHOLESTEROLEMIA (ICD-272.0) - controlled on SIMVASTATIN 80mg /d & Fish Oil and labs followed by DrBerry (pt will ask for copies to be sent to [place])... he recently added NIASPAN for HDL of 31, but this was stopped due to flushing/ intol...  ~  FLP 12/09 showed TChol 138, TG 220, HDL 34, LDL 60 ~  FLP 12/10 showed TChol 132, TG 253, HDL 33,  LDL 61 ~  FLP 12/11 showed TChol 144, TG 200, HDL 33, LDL 71... advised low fat diet & get weight down ~  FLP 12/12 on Simva80 showed  TChol 156, TG 195, HDL 42, LDL 75; needs better low fat diet & wt reduction...  OBESITY (ICD-278.00) - weight fluctuates betw 240-250# & he has been unable to lose weight... he is sedentary and we discussed an exercise program for him, since he is limited by LBP. ~  NOTE:  labs 12/09 showed BS= 131, HgA1c= 6.1.[name]. advised low carb diet, get wt down! ~  6/10: wt today= 249#, but pt states down to 233# at home- "just decr portions" ~  12/10:  wt today= 252# post holiday... he states "I'm starving myself to death"... ~  04/02/2023:  weight = 252#.[name]. states he's lost 4" at the waist... ~  12/11:  weight = 252# ~  4/12:  Weight = 256# ~  6/12:  Weight = 262# ~  12/12:  Weight = 262# ~  6/13:  Weight = 246# ~  7/14:  Weight = 211#  DIVERTICULOSIS OF COLON (ICD-562.10) COLONIC POLYPS (ICD-211.3) HEMORRHOIDS (ICD-455.6) ~  last colonoscopy was 12/07 by DrStark and showed divertic, polyps 2-42mm size (adenomas), and hems... f/u planned 69yrs. ~  CT Abd&Pelvis 7/10 showed several sm gallstones in gallbladder, bilat renal cysts, 1.7cm left adrenal adenoma & 9mm right adrenal lesion, advanced atherosclerotic changes in distalAo & branches, sm bilat inguinal hernias containing fat; DDD & old healed right rib fx.  DEGENERATIVE JOINT DISEASE (ICD-715.90) LOW BACK PAIN, CHRONIC (ICD-724.2) ~  he uses DCN 100 for pain and prev requested refill of #360 for 3 month supply... ~  12/10: DCN now off the market & Rx for Vicodin #270- up to 3/d as needed for pain. ~  12/12:  He is c/o incr back pain & he doesn't want to f/u w/ DrApling  ANXIETY (ICD-300.00) - he uses ALPRAZOLAM 0.5mg  Tid and prev requested #270 for 3 month supply...   Past Surgical History  Procedure Laterality Date  . Pta to right common femoral artery  2001    Dr. [name]  . Carotid endarterectomy  2002     bilateral.  Dr. Hart Rochester  . Coronary artery bypass graft  2005  . Pacemaker placement  July 2009    for CHB  Dr. Allyson Sabal. [employer]  . Appendectomy  1948  . US echocardiography  11/06/2011    Mod. LVH w/mod. depressed systolic function,EF 35-40%,gobal LV hypokinesis,mildly dilated LA,mild aortic root dilatation  . Nm myoview ltd  09/04/2010    mild perfusion defect basal inferior,mid inferior, & apical inferior regions. LV systolic function deteriorated since 2006  . Cardiac catheterization  02/02/2008    patent grafts    Outpatient Encounter Prescriptions as of 07/23/2013  Medication Sig Dispense Refill  . ALPRAZolam (XANAX) 0.5 MG tablet Take 1 tablet (0.5 mg total) by mouth 3 (three)  times daily as needed for anxiety. Not to exceed 3 per day.  270 tablet  1  . aspirin 81 MG tablet Take 81 mg by mouth daily.        . budesonide-formoterol (SYMBICORT) 160-4.5 MCG/ACT inhaler Inhale 2 puffs into the lungs 2 (two) times daily.  3 Inhaler  3  . Coenzyme Q10 (COQ10) 100 MG CAPS Take 1 capsule by mouth daily.        [name] diltiazem (CARDIZEM CD) 180 MG 24 hr capsule Take 180 mg by mouth daily.      [name] docusate sodium (COLACE) 100 MG capsule Take 2 tabs by mouth once daily       . fluticasone (FLONASE) 50 MCG/ACT nasal spray Place 1 spray into the nose 2 (two) times daily.  48 g  3  . furosemide (LASIX) 40 MG tablet Take as needed for swelling Per  Dr. Salena Saner.      . guaiFENesin (MUCINEX) 600 MG 12 hr tablet Take 1 to 2 tabs by mouth two times a day with fluids for thick phlegm       . hydrochlorothiazide (MICROZIDE) 12.5 MG capsule Take 12.5 mg by mouth daily.      [name] HYDROcodone-acetaminophen (NORCO/VICODIN) 5-325 MG per tablet Take 1/2 to 1 tablet by mouth three times daily as needed for pain  270 tablet  1  . losartan (COZAAR) 100 MG tablet Take 100 mg by mouth daily.        . meclizine (ANTIVERT) 25 MG tablet Take 1/2 to 1 tablet by mouth every 6 hours as needed for dizziness  90 tablet  3  . metoprolol  tartrate (LOPRESSOR) 25 MG tablet Take 25 mg by mouth 2 (two) times daily. Per Dr. Salena Saner      . montelukast (SINGULAIR) 10 MG tablet Take 1 tablet (10 mg total) by mouth at bedtime.  90 tablet  3  . Omega-3 Fatty Acids (FISH OIL) 1000 MG CAPS Take 1 capsule by mouth daily.        . polyethylene glycol (MIRALAX / GLYCOLAX) packet Take 17 g by mouth daily.        . potassium chloride SA (K-DUR,KLOR-CON) 20 MEQ tablet Take 20 mEq by mouth daily.      . predniSONE (DELTASONE) 5 MG tablet Take 2.5 mg by mouth daily.      . ranitidine (ZANTAC) 150 MG capsule Take 150 mg by mouth 2 (two) times daily.      . Rivaroxaban (XARELTO PO) Take 20 mg by mouth daily.       . simvastatin (ZOCOR) 80 MG tablet Take 80 mg by mouth at bedtime.        [name] tiotropium (SPIRIVA) 18 MCG inhalation capsule Place 1 capsule (18 mcg total) into inhaler and inhale daily.  90 capsule  3  . vitamin C (ASCORBIC ACID) 500 MG tablet Take 500 mg by mouth daily.      [name] moxifloxacin (AVELOX) 400 MG tablet Take 1 tablet (400 mg total) by mouth daily.  7 tablet  0   No facility-administered encounter medications on file as of 07/23/2013.    Allergies  Allergen Reactions  . Penicillins     REACTION: Allergic to PCN w/ throat swelling \\T \ hives    Current Medications, Allergies, Past Medical History, Past Surgical History, Family History, and Social History were reviewed in [employer] record.    Review of Systems       See HPI - all other  systems neg except as noted...      The patient complains of dyspnea on exertion.  The patient denies anorexia, fever, weight loss, weight gain, vision loss, decreased hearing, hoarseness, chest pain, syncope, peripheral edema,   headaches, hemoptysis, abdominal pain, melena, hematochezia, severe indigestion/heartburn, hematuria, incontinence, muscle weakness, suspicious skin lesions, transient blindness, difficulty walking, depression, unusual weight change, abnormal bleeding,  enlarged lymph nodes, and angioedema.     Objective:   Physical Exam     WD, Obese, 79 y/o WM in NAD in wheelchair  GENERAL:  Alert & oriented; pleasant & cooperative... HEENT:  [place]/AT, EOM-full, PERRLA, TMs- distal wax blocking left TM, NOSE-clear, THROAT- sl red w/o exud... NECK:  Supple w/ fair ROM; no JVD; s/p bilat CAE's w/ bruits; no thyromegaly or nodules palpated; no lymphadenopathy. CHEST:  decr BS bilat w/ few scat rhonchi, no rales, no signs of consolidation... HEART:  Regular Rhythm; without murmurs/ rubs/ or gallops- median sternotomy scar...pacemaker in place ABDOMEN:  Obese, soft & nontender; normal bowel sounds; no organomegaly or masses detected. EXT: warm and dry, mild arthritic changes; no varicose veins/ +venous insuffic/ no edema. NEURO:   ; no focal neuro deficits...  DERM: no lesions seen, no rash etc...    Assessment & Plan:

## 2013-07-23 NOTE — Patient Instructions (Addendum)
Extend Avelox 400mg  daily for 3 days  Increase Prednisone 10mg  daily for 1 week then back to 5mg  daily  I will call with lab results.  Mucinex DM Twice daily  As needed  Cough/congestion  Fluids and rest  Follow up in office in 3 days and As needed   Please contact office for sooner follow up if symptoms do not improve or worsen or seek emergency care

## 2013-07-23 NOTE — Assessment & Plan Note (Addendum)
Suspected PNA with associated small right pleural effusion  Complicated by COPD  No sign of fluid overload.  Check labs with bnp  Discussed hospital admission , pt is eating well and symptoms are slowly improving will monitor closely in OP setting . If not improving or worsens, pt agrees to return sooner for hospital admit. or go to ER.   Plan  Extend Avelox 400mg  daily for 3 days  Increase Prednisone 10mg  daily for 1 week then back to 5mg  daily  I will call with lab results.  Mucinex DM Twice daily  As needed  Cough/congestion  Fluids and rest  Follow up in office in 3 days and As needed   Please contact office for sooner follow up if symptoms do not improve or worsen or seek emergency care

## 2013-07-25 NOTE — Progress Notes (Signed)
Quick Note:  Pt scheduled for 3 day follow up w/ TP tomorrow 10.9.14 ______

## 2013-07-26 ENCOUNTER — Ambulatory Visit (INDEPENDENT_AMBULATORY_CARE_PROVIDER_SITE_OTHER): Payer: 59 | Admitting: Adult Health

## 2013-07-26 ENCOUNTER — Encounter: Payer: Self-pay | Admitting: Adult Health

## 2013-07-26 ENCOUNTER — Ambulatory Visit: Payer: 59 | Admitting: Adult Health

## 2013-07-26 ENCOUNTER — Ambulatory Visit (INDEPENDENT_AMBULATORY_CARE_PROVIDER_SITE_OTHER)
Admission: RE | Admit: 2013-07-26 | Discharge: 2013-07-26 | Disposition: A | Discharge status: Home / Self Care | Payer: 59 | Source: Ambulatory Visit | Attending: Adult Health | Admitting: Adult Health

## 2013-07-26 VITALS — BP 134/74 | HR 81 | Temp 97.2°F | Ht 69.0 in | Wt 209.6 lb

## 2013-07-26 DIAGNOSIS — J189 Pneumonia, unspecified organism: Secondary | ICD-10-CM

## 2013-07-26 DIAGNOSIS — J180 Bronchopneumonia, unspecified organism: Secondary | ICD-10-CM

## 2013-07-26 MED ORDER — LEVALBUTEROL HCL 0.63 MG/3ML IN NEBU
0.6300 mg | INHALATION_SOLUTION | Freq: Once | RESPIRATORY_TRACT | Status: AC
  Administered 2013-07-26: 0.63 mg via RESPIRATORY_TRACT

## 2013-07-26 NOTE — Patient Instructions (Addendum)
Finish Prednisone 10mg  daily for this week then back to 5mg  daily next week.  Continue on lasix daily for leg swelling and weight gain per cardiology recommendations.  Low salt diet  Mucinex DM Twice daily  As needed  Cough/congestion  Fluids and rest  Follow up Dr. Kriste Basque  Or Parrett in 2 weeks with chest xray and labs.  Please contact office for sooner follow up if symptoms do not improve or worsen or seek emergency care

## 2013-08-01 NOTE — Progress Notes (Signed)
Subjective:    Patient ID: Todd Kelly, male    DOB: Aug 21, 1931, 79 y.o.   MRN: [id number]  HPI 79 y/o WM here for a follow up visit... he has mult med problems including:  COPD/ AB/ ex-smoker;  HBP/ ASHD/ AFib/ Pacer- followed by DrSolomon/ [facility];  Cerebrovasc & peripheral vasc disease;  Hyperlipidemia/ Obesity;  DJD/ LBP;  Anxiety, etc...   07/23/13 Acute OV  Complains of increased SOB, prod cough with clear/light brown mucus, weakness/fatigue x10days - just finished avelox x7 days -last dose yesterday. .  denies wheezing, chest tightness, hemoptysis, edema, f/c/s Feeling some better but still weak with congested cough and dyspnea. Coughing up light brown mucus.  On prednisone 5mg  daily .  Eating well. No orthopnea, lower ext edema.  CXR today shows New small right pleural effusion and trace left pleural  fluid noted with patchy bibasilar airspace opacities. No hemoptysis, n//v/d, chest pain , hemoptysis.  No fever .  >exended avelox -tot 10d   07/26/13 Follow up PNA  Pt returns for 3 day follow up for PNA  Seen last ov with productive cough. CXR showed patchy bibasilar aspdz and smal right effusion  tx w/ 10 days course of avelox.  He returns today feeling some better, eating better.  Reports symptoms are slightly improved but still coughing and weak.  finished avelox yesterday CXR today with no change.  No hemoptysis, chest pain , orthopnea, edema, n/v, or fever.           Problem List:  BRONCHITIS, CHRONIC, ACUTE EXACERBATION (ICD-491.21) >> see prev notes... COPD (ICD-496) - ex-smoker, freq flairs when off Rx... now taking: SYMBICORT 160- 2spBid,  SPIRIVA daily, SINGULAIR 10mg /d,  MUCINEX 1-2 Bid w/ fluids, + FLONASE... ~  baseline CXR shows COPD, prev median sternotomy, NAD.Marland KitchenMarland Kitchen  ~   CT CHEST 4/09 w/ extensive coronary calcif, prior CABG, no signif abn in the lungs. ~  PFT's 4/09 showed FVC=2.88 (71%), FEV1=2.13 (67%), FEV1/ FVC= 74%, mid-flows= 51%pred... ~  CXR 3/10 showed  sl cardiomeg, pleural thickening, pacer, DJD sp, NAD. ~  CXR 12/11 showed s/p CABG, pacer on left, borderline cardiomeg, basilar scarring, NAD. ~  CXR 4/12 showed s/p CABG, pacer, extrapleural fat deposition bilat, NAD.[name]. ~  CXR 1/14 showed mod cardiomeg, prev median sternotomy, pacer, clear lungs & DJD spine... ~  7/14:  Pred is down to 5mg /d & he is much improved w/ the wt reduction...  OBSTRUCTIVE SLEEP APNEA (ICD-327.23) - he does not tolerate his CPAP...  HYPERTENSION (ICD-401.9) controlled on meds:  METOPROLOL 25mg Bid, COZAAR 100/d, LASIX 40mg /d; BP= 120/68 today & he denies CP, palpit, SOB, edema...  ARTERIOSCLEROTIC HEART DISEASE >> s/p CABG 2005 ISCHEMIC CARDIOMYOPATHY >> EF=35-40% ~  he is on ASA 81mg /d,  Now XARELTO 20mg /d, + meds as listed above... ~   CT CHEST 4/09 w/ extensive coronary calcif, prior CABG, no signif abn in the lungs. ~  cath 4/09 showed patent grafts, some disease in Circ & RCA, patent stent to left renal art... med Rx.  ~  2DEcho 5/11 showed mod conc LVH, mod reduced LVF w/ EF= 35-40% w/ apical AK, dil RA... ~  Nuclear Stress Test 11/11 showed a mild perfusion defect inferiorly consistent w/ a scar, global LVF is reduced w/ EF= 38%, dyssynchony due to RV apical pacing...  ATRIAL FIBRILLATION & Tachy-Brady Syndrome >> COMPLETE HEART BLOCK & CARDIAC PACEMAKER IN SITU >> ~  he had syncope w/ 4 fx right ribs and CHB & AFib requiring pacer &  Coumadin started... [facility] + DrCroitoru... f/u pacer checks OK & no changes made...  CEREBROVASCULAR DISEASE (ICD-437.9) - S/P Bilat CAE's 2000 & doppler's are followed by DrBerry...  ~  last CDoppler [facility] by [facility] 4/09 showed mod distal right CCA stenosis. ~  pt reports CDopplers done after 10/11 OV w/ DrSolomon was "OK" ~  DrC note of 4/13 indicates last CDoppler w/ 50-69%restenosis on the right;  He is due for repeat CDoppler soon...  PERIPHERAL VASCULAR DISEASE (ICD-443.9) - S/P Rt fem art thrombectomy  2000, and Rt renal art stent 2002... he has all this followed by [facility] now & last doppler & cath showed patent stent.  HYPERCHOLESTEROLEMIA (ICD-272.0) - controlled on SIMVASTATIN 80mg /d & Fish Oil and labs followed by DrBerry (pt will ask for copies to be sent to [place])... he recently added NIASPAN for HDL of 31, but this was stopped due to flushing/ intol...  ~  FLP 12/09 showed TChol 138, TG 220, HDL 34, LDL 60 ~  FLP 12/10 showed TChol 132, TG 253, HDL 33, LDL 61 ~  FLP 12/11 showed TChol 144, TG 200, HDL 33, LDL 71... advised low fat diet & get weight down ~  FLP 12/12 on Simva80 showed  TChol 156, TG 195, HDL 42, LDL 75; needs better low fat diet & wt reduction...  OBESITY (ICD-278.00) - weight fluctuates betw 240-250# & he has been unable to lose weight... he is sedentary and we discussed an exercise program for him, since he is limited by LBP. ~  NOTE:  labs 12/09 showed BS= 131, HgA1c= 6.1.[name]. advised low carb diet, get wt down! ~  6/10: wt today= 249#, but pt states down to 233# at home- "just decr portions" ~  12/10:  wt today= 252# post holiday... he states "I'm starving myself to death"... ~  04-07-23:  weight = 252#.[name]. states he's lost 4" at the waist... ~  12/11:  weight = 252# ~  4/12:  Weight = 256# ~  6/12:  Weight = 262# ~  12/12:  Weight = 262# ~  6/13:  Weight = 246# ~  7/14:  Weight = 211#  DIVERTICULOSIS OF COLON (ICD-562.10) COLONIC POLYPS (ICD-211.3) HEMORRHOIDS (ICD-455.6) ~  last colonoscopy was 12/07 by DrStark and showed divertic, polyps 2-77mm size (adenomas), and hems... f/u planned 33yrs. ~  CT Abd&Pelvis 7/10 showed several sm gallstones in gallbladder, bilat renal cysts, 1.7cm left adrenal adenoma & 9mm right adrenal lesion, advanced atherosclerotic changes in distalAo & branches, sm bilat inguinal hernias containing fat; DDD & old healed right rib fx.  DEGENERATIVE JOINT DISEASE (ICD-715.90) LOW BACK PAIN, CHRONIC (ICD-724.2) ~  he uses DCN 100 for pain and prev  requested refill of #360 for 3 month supply... ~  12/10: DCN now off the market & Rx for Vicodin #270- up to 3/d as needed for pain. ~  12/12:  He is c/o incr back pain & he doesn't want to f/u w/ DrApling  ANXIETY (ICD-300.00) - he uses ALPRAZOLAM 0.5mg  Tid and prev requested #270 for 3 month supply...   Past Surgical History  Procedure Laterality Date  . Pta to right common femoral artery  2001    Dr. [name]  . Carotid endarterectomy  2002    bilateral.  Dr. Hart Rochester  . Coronary artery bypass graft  2005  . Pacemaker placement  July 2009    for CHB  Dr. Allyson Sabal. [employer]  . Appendectomy  1948  . US echocardiography  11/06/2011  Mod. LVH w/mod. depressed systolic function,EF 35-40%,gobal LV hypokinesis,mildly dilated LA,mild aortic root dilatation  . Nm myoview ltd  09/04/2010    mild perfusion defect basal inferior,mid inferior, & apical inferior regions. LV systolic function deteriorated since 2006  . Cardiac catheterization  02/02/2008    patent grafts    Outpatient Encounter Prescriptions as of 07/26/2013  Medication Sig Dispense Refill  . ALPRAZolam (XANAX) 0.5 MG tablet Take 1 tablet (0.5 mg total) by mouth 3 (three) times daily as needed for anxiety. Not to exceed 3 per day.  270 tablet  1  . aspirin 81 MG tablet Take 81 mg by mouth daily.        . budesonide-formoterol (SYMBICORT) 160-4.5 MCG/ACT inhaler Inhale 2 puffs into the lungs 2 (two) times daily.  3 Inhaler  3  . Coenzyme Q10 (COQ10) 100 MG CAPS Take 1 capsule by mouth daily.        [name] diltiazem (CARDIZEM CD) 180 MG 24 hr capsule Take 180 mg by mouth daily.      [name] docusate sodium (COLACE) 100 MG capsule Take 2 tabs by mouth once daily       . fluticasone (FLONASE) 50 MCG/ACT nasal spray Place 1 spray into the nose 2 (two) times daily.  48 g  3  . furosemide (LASIX) 40 MG tablet Take as needed for swelling Per  Dr. Salena Saner.      . guaiFENesin (MUCINEX) 600 MG 12 hr tablet Take 1 to 2 tabs by mouth two times a day with fluids for  thick phlegm       . hydrochlorothiazide (MICROZIDE) 12.5 MG capsule Take 12.5 mg by mouth daily.      [name] HYDROcodone-acetaminophen (NORCO/VICODIN) 5-325 MG per tablet Take 1/2 to 1 tablet by mouth three times daily as needed for pain  270 tablet  1  . losartan (COZAAR) 100 MG tablet Take 100 mg by mouth daily.        . meclizine (ANTIVERT) 25 MG tablet Take 1/2 to 1 tablet by mouth every 6 hours as needed for dizziness  90 tablet  3  . metoprolol tartrate (LOPRESSOR) 25 MG tablet Take 25 mg by mouth 2 (two) times daily. Per Dr. Salena Saner      . montelukast (SINGULAIR) 10 MG tablet Take 1 tablet (10 mg total) by mouth at bedtime.  90 tablet  3  . Omega-3 Fatty Acids (FISH OIL) 1000 MG CAPS Take 1 capsule by mouth daily.        . polyethylene glycol (MIRALAX / GLYCOLAX) packet Take 17 g by mouth daily.        . potassium chloride SA (K-DUR,KLOR-CON) 20 MEQ tablet Take 20 mEq by mouth daily.      . predniSONE (DELTASONE) 5 MG tablet Take 2.5 mg by mouth daily.      . ranitidine (ZANTAC) 150 MG capsule Take 150 mg by mouth 2 (two) times daily.      . Rivaroxaban (XARELTO PO) Take 20 mg by mouth daily.       . simvastatin (ZOCOR) 80 MG tablet Take 80 mg by mouth at bedtime.        [name] tiotropium (SPIRIVA) 18 MCG inhalation capsule Place 1 capsule (18 mcg total) into inhaler and inhale daily.  90 capsule  3  . vitamin C (ASCORBIC ACID) 500 MG tablet Take 500 mg by mouth daily.      . [DISCONTINUED] moxifloxacin (AVELOX) 400 MG tablet Take 1 tablet (400 mg total) by  mouth daily.  7 tablet  0  . [DISCONTINUED] moxifloxacin (AVELOX) 400 MG tablet Take 1 tablet (400 mg total) by mouth daily.  3 tablet  0  . [EXPIRED] levalbuterol (XOPENEX) nebulizer solution 0.63 mg        No facility-administered encounter medications on file as of 07/26/2013.    Allergies  Allergen Reactions  . Penicillins     REACTION: Allergic to PCN w/ throat swelling \\T \ hives    Current Medications, Allergies, Past Medical History,  Past Surgical History, Family History, and Social History were reviewed in [employer] record.    Review of Systems       See HPI - all other systems neg except as noted...      The patient complains of dyspnea on exertion.  The patient denies anorexia, fever, weight loss, weight gain, vision loss, decreased hearing, hoarseness, chest pain, syncope, peripheral edema,   headaches, hemoptysis, abdominal pain, melena, hematochezia, severe indigestion/heartburn, hematuria, incontinence, muscle weakness, suspicious skin lesions, transient blindness, difficulty walking, depression, unusual weight change, abnormal bleeding, enlarged lymph nodes, and angioedema.     Objective:   Physical Exam     WD, Obese, 79 y/o WM in NAD in wheelchair  GENERAL:  Alert & oriented; pleasant & cooperative... HEENT:  [place]/AT,NOSE-clear, THROAT clear  NECK:  Supple w/ fair ROM; no JVD; s/p bilat CAE's w/ bruits; no thyromegaly or nodules palpated; no lymphadenopathy. CHEST:  decr BS bilat w/ few scat rhonchi, no rales, no signs of consolidation... HEART:  Regular Rhythm; without murmurs/ rubs/ or gallops- median sternotomy scar...pacemaker in place ABDOMEN:  Obese, soft & nontender; normal bowel sounds; no organomegaly or masses detected. EXT: warm and dry, mild arthritic changes; no varicose veins/ +venous insuffic/ no edema. NEURO:   ; no focal neuro deficits...  DERM: no lesions seen, no rash etc...  CXR There is a small  right pleural effusion with underlying consolidation at the right  lung base. The appearance of this is unchanged.     Assessment & Plan:

## 2013-08-01 NOTE — Assessment & Plan Note (Addendum)
Right sided PNA - clinically improving with finished full 10 d course of Avelox   Plan  Finish Prednisone 10mg  daily for this week then back to 5mg  daily next week.  Continue on lasix daily for leg swelling and weight gain per cardiology recommendations.  Low salt diet  Mucinex DM Twice daily  As needed  Cough/congestion  Fluids and rest  Follow up Dr. Kriste Basque  Or [facility] in 2 weeks with chest xray and labs.  Please contact office for sooner follow up if symptoms do not improve or worsen or seek emergency care

## 2013-08-03 ENCOUNTER — Telehealth: Payer: Self-pay | Admitting: *Deleted

## 2013-08-03 NOTE — Telephone Encounter (Signed)
Spoke to wife. Informed her that Dr Royann Shivers called [employer] and spoke to an representative Harrold Donath). He informed Dr C that a prior is not needed for Xarelto.  She voiced understanding.

## 2013-08-09 ENCOUNTER — Encounter: Payer: Self-pay | Admitting: Adult Health

## 2013-08-09 ENCOUNTER — Encounter (HOSPITAL_COMMUNITY): Payer: Self-pay | Admitting: *Deleted

## 2013-08-09 ENCOUNTER — Inpatient Hospital Stay (HOSPITAL_COMMUNITY)
Admission: EM | Admit: 2013-08-09 | Discharge: 2013-08-14 | DRG: 293 | Disposition: A | Discharge status: Home / Self Care | Payer: 59 | Attending: Internal Medicine | Admitting: Internal Medicine

## 2013-08-09 ENCOUNTER — Encounter (HOSPITAL_COMMUNITY): Payer: Self-pay | Admitting: Emergency Medicine

## 2013-08-09 ENCOUNTER — Ambulatory Visit (INDEPENDENT_AMBULATORY_CARE_PROVIDER_SITE_OTHER): Payer: 59 | Admitting: Adult Health

## 2013-08-09 ENCOUNTER — Ambulatory Visit (INDEPENDENT_AMBULATORY_CARE_PROVIDER_SITE_OTHER)
Admission: RE | Admit: 2013-08-09 | Discharge: 2013-08-09 | Disposition: A | Discharge status: Home / Self Care | Payer: 59 | Source: Ambulatory Visit | Attending: Adult Health | Admitting: Adult Health

## 2013-08-09 ENCOUNTER — Other Ambulatory Visit (INDEPENDENT_AMBULATORY_CARE_PROVIDER_SITE_OTHER): Payer: 59

## 2013-08-09 VITALS — BP 118/74 | HR 68 | Temp 97.7°F | Ht 69.0 in | Wt 208.4 lb

## 2013-08-09 DIAGNOSIS — Z7982 Long term (current) use of aspirin: Secondary | ICD-10-CM

## 2013-08-09 DIAGNOSIS — I4891 Unspecified atrial fibrillation: Secondary | ICD-10-CM | POA: Diagnosis present

## 2013-08-09 DIAGNOSIS — I739 Peripheral vascular disease, unspecified: Secondary | ICD-10-CM | POA: Diagnosis present

## 2013-08-09 DIAGNOSIS — G4733 Obstructive sleep apnea (adult) (pediatric): Secondary | ICD-10-CM | POA: Diagnosis present

## 2013-08-09 DIAGNOSIS — IMO0002 Reserved for concepts with insufficient information to code with codable children: Secondary | ICD-10-CM

## 2013-08-09 DIAGNOSIS — J449 Chronic obstructive pulmonary disease, unspecified: Secondary | ICD-10-CM | POA: Diagnosis present

## 2013-08-09 DIAGNOSIS — Z951 Presence of aortocoronary bypass graft: Secondary | ICD-10-CM | POA: Diagnosis not present

## 2013-08-09 DIAGNOSIS — R0602 Shortness of breath: Secondary | ICD-10-CM | POA: Diagnosis present

## 2013-08-09 DIAGNOSIS — J189 Pneumonia, unspecified organism: Secondary | ICD-10-CM

## 2013-08-09 DIAGNOSIS — Z7901 Long term (current) use of anticoagulants: Secondary | ICD-10-CM

## 2013-08-09 DIAGNOSIS — M1990 Unspecified osteoarthritis, unspecified site: Secondary | ICD-10-CM | POA: Diagnosis present

## 2013-08-09 DIAGNOSIS — R0609 Other forms of dyspnea: Secondary | ICD-10-CM

## 2013-08-09 DIAGNOSIS — M545 Low back pain, unspecified: Secondary | ICD-10-CM | POA: Diagnosis present

## 2013-08-09 DIAGNOSIS — Z79899 Other long term (current) drug therapy: Secondary | ICD-10-CM

## 2013-08-09 DIAGNOSIS — F411 Generalized anxiety disorder: Secondary | ICD-10-CM | POA: Diagnosis present

## 2013-08-09 DIAGNOSIS — Z87891 Personal history of nicotine dependence: Secondary | ICD-10-CM

## 2013-08-09 DIAGNOSIS — G8929 Other chronic pain: Secondary | ICD-10-CM | POA: Diagnosis present

## 2013-08-09 DIAGNOSIS — Z950 Presence of cardiac pacemaker: Secondary | ICD-10-CM | POA: Diagnosis not present

## 2013-08-09 DIAGNOSIS — E7800 Pure hypercholesterolemia, unspecified: Secondary | ICD-10-CM | POA: Diagnosis present

## 2013-08-09 DIAGNOSIS — I509 Heart failure, unspecified: Secondary | ICD-10-CM | POA: Diagnosis present

## 2013-08-09 DIAGNOSIS — Z880 Allergy status to penicillin: Secondary | ICD-10-CM

## 2013-08-09 DIAGNOSIS — I2510 Atherosclerotic heart disease of native coronary artery without angina pectoris: Secondary | ICD-10-CM | POA: Diagnosis present

## 2013-08-09 DIAGNOSIS — J180 Bronchopneumonia, unspecified organism: Secondary | ICD-10-CM

## 2013-08-09 DIAGNOSIS — E876 Hypokalemia: Secondary | ICD-10-CM | POA: Diagnosis not present

## 2013-08-09 DIAGNOSIS — I2589 Other forms of chronic ischemic heart disease: Secondary | ICD-10-CM

## 2013-08-09 DIAGNOSIS — I255 Ischemic cardiomyopathy: Secondary | ICD-10-CM | POA: Diagnosis present

## 2013-08-09 DIAGNOSIS — R0600 Dyspnea, unspecified: Secondary | ICD-10-CM

## 2013-08-09 DIAGNOSIS — J4489 Other specified chronic obstructive pulmonary disease: Secondary | ICD-10-CM | POA: Diagnosis present

## 2013-08-09 DIAGNOSIS — I10 Essential (primary) hypertension: Secondary | ICD-10-CM | POA: Diagnosis present

## 2013-08-09 DIAGNOSIS — I5023 Acute on chronic systolic (congestive) heart failure: Principal | ICD-10-CM | POA: Diagnosis present

## 2013-08-09 DIAGNOSIS — I442 Atrioventricular block, complete: Secondary | ICD-10-CM

## 2013-08-09 LAB — BASIC METABOLIC PANEL
BUN: 21 mg/dL (ref 6–23)
CO2: 32 mEq/L (ref 19–32)
Calcium: 8.5 mg/dL (ref 8.4–10.5)
Creatinine, Ser: 0.8 mg/dL (ref 0.4–1.5)
GFR: 98.79 mL/min (ref >60.00)
Glucose, Bld: 137 mg/dL — ABNORMAL HIGH (ref 70–99)
Sodium: 142 mEq/L (ref 135–145)

## 2013-08-09 LAB — CBC WITH DIFFERENTIAL/PLATELET
Basophils Absolute: 0 10*3/uL (ref 0.0–0.1)
Basophils Relative: 0 % (ref 0–1)
Eosinophils Absolute: 0 10*3/uL (ref 0.0–0.7)
Eosinophils Relative: 0 % (ref 0–5)
HCT: 37.5 % — ABNORMAL LOW (ref 39.0–52.0)
Hemoglobin: 11.5 g/dL — ABNORMAL LOW (ref 13.0–17.0)
Lymphocytes Relative: 12 % (ref 12–46)
MCH: 24.6 pg — ABNORMAL LOW (ref 26.0–34.0)
MCHC: 30.7 g/dL (ref 30.0–36.0)
MCV: 80.1 fL (ref 78.0–100.0)
Monocytes Absolute: 0.8 10*3/uL (ref 0.1–1.0)
Neutro Abs: 7.3 10*3/uL (ref 1.7–7.7)
Neutrophils Relative %: 79 % — ABNORMAL HIGH (ref 43–77)
Platelets: 166 10*3/uL (ref 150–400)
RBC: 4.68 MIL/uL (ref 4.22–5.81)
RDW: 18 % — ABNORMAL HIGH (ref 11.5–15.5)
WBC: 9.3 10*3/uL (ref 4.0–10.5)

## 2013-08-09 LAB — TROPONIN I: Troponin I: <0.3 ng/mL (ref <0.30)

## 2013-08-09 MED ORDER — DILTIAZEM HCL ER COATED BEADS 180 MG PO CP24
180.0000 mg | ORAL_CAPSULE | Freq: Every day | ORAL | Status: DC
  Administered 2013-08-10 – 2013-08-14 (×5): 180 mg via ORAL
  Filled 2013-08-09 (×6): qty 1

## 2013-08-09 MED ORDER — SODIUM CHLORIDE 0.9 % IV SOLN: 250.0000 mL | INTRAVENOUS | Status: DC | PRN

## 2013-08-09 MED ORDER — DOCUSATE SODIUM 100 MG PO CAPS
200.0000 mg | ORAL_CAPSULE | Freq: Every day | ORAL | Status: DC
  Administered 2013-08-09 – 2013-08-14 (×5): 200 mg via ORAL
  Filled 2013-08-09 (×6): qty 2

## 2013-08-09 MED ORDER — RIVAROXABAN 20 MG PO TABS
20.0000 mg | ORAL_TABLET | Freq: Every day | ORAL | Status: DC
  Administered 2013-08-09 – 2013-08-13 (×5): 20 mg via ORAL
  Filled 2013-08-09 (×6): qty 1

## 2013-08-09 MED ORDER — SODIUM CHLORIDE 0.9 % IJ SOLN: 3.0000 mL | INTRAMUSCULAR | Status: DC | PRN

## 2013-08-09 MED ORDER — FUROSEMIDE 10 MG/ML IJ SOLN
40.0000 mg | Freq: Two times a day (BID) | INTRAMUSCULAR | Status: AC
  Administered 2013-08-10 (×2): 40 mg via INTRAVENOUS
  Filled 2013-08-09 (×4): qty 4

## 2013-08-09 MED ORDER — METOPROLOL TARTRATE 25 MG PO TABS
25.0000 mg | ORAL_TABLET | Freq: Two times a day (BID) | ORAL | Status: DC
  Administered 2013-08-09 – 2013-08-13 (×8): 25 mg via ORAL
  Filled 2013-08-09 (×11): qty 1

## 2013-08-09 MED ORDER — MONTELUKAST SODIUM 10 MG PO TABS
10.0000 mg | ORAL_TABLET | Freq: Every day | ORAL | Status: DC
  Administered 2013-08-09 – 2013-08-13 (×5): 10 mg via ORAL
  Filled 2013-08-09 (×6): qty 1

## 2013-08-09 MED ORDER — RIVAROXABAN 20 MG PO TABS: 20.0000 mg | ORAL_TABLET | Freq: Every day | ORAL | Status: DC

## 2013-08-09 MED ORDER — PREDNISONE 5 MG PO TABS
5.0000 mg | ORAL_TABLET | Freq: Every day | ORAL | Status: DC
  Administered 2013-08-10 – 2013-08-14 (×5): 5 mg via ORAL
  Filled 2013-08-09 (×6): qty 1

## 2013-08-09 MED ORDER — POTASSIUM CHLORIDE CRYS ER 20 MEQ PO TBCR
20.0000 meq | EXTENDED_RELEASE_TABLET | Freq: Every day | ORAL | Status: DC
  Administered 2013-08-09 – 2013-08-14 (×5): 20 meq via ORAL
  Filled 2013-08-09 (×7): qty 1

## 2013-08-09 MED ORDER — ACETAMINOPHEN 325 MG PO TABS
650.0000 mg | ORAL_TABLET | ORAL | Status: DC | PRN
  Administered 2013-08-10 – 2013-08-14 (×2): 650 mg via ORAL
  Filled 2013-08-09 (×3): qty 2

## 2013-08-09 MED ORDER — SODIUM CHLORIDE 0.9 % IJ SOLN
3.0000 mL | Freq: Two times a day (BID) | INTRAMUSCULAR | Status: DC
  Administered 2013-08-09 – 2013-08-14 (×6): 3 mL via INTRAVENOUS

## 2013-08-09 MED ORDER — TIOTROPIUM BROMIDE MONOHYDRATE 18 MCG IN CAPS
18.0000 ug | ORAL_CAPSULE | Freq: Every day | RESPIRATORY_TRACT | Status: DC
  Administered 2013-08-10 – 2013-08-14 (×5): 18 ug via RESPIRATORY_TRACT
  Filled 2013-08-09: qty 5

## 2013-08-09 MED ORDER — VITAMIN C 500 MG PO TABS
500.0000 mg | ORAL_TABLET | Freq: Every day | ORAL | Status: DC
  Administered 2013-08-09 – 2013-08-14 (×6): 500 mg via ORAL
  Filled 2013-08-09 (×6): qty 1

## 2013-08-09 MED ORDER — ONDANSETRON HCL 4 MG/2ML IJ SOLN: 4.0000 mg | Freq: Four times a day (QID) | INTRAMUSCULAR | Status: DC | PRN

## 2013-08-09 MED ORDER — ASPIRIN 81 MG PO CHEW
81.0000 mg | CHEWABLE_TABLET | Freq: Every day | ORAL | Status: DC
  Administered 2013-08-09 – 2013-08-14 (×6): 81 mg via ORAL
  Filled 2013-08-09 (×7): qty 1

## 2013-08-09 MED ORDER — HYDROCODONE-ACETAMINOPHEN 5-325 MG PO TABS: ORAL_TABLET | ORAL | Status: DC

## 2013-08-09 MED ORDER — INFLUENZA VAC SPLIT QUAD 0.5 ML IM SUSP
0.5000 mL | INTRAMUSCULAR | Status: AC
  Administered 2013-08-10: 0.5 mL via INTRAMUSCULAR
  Filled 2013-08-09: qty 0.5

## 2013-08-09 MED ORDER — FUROSEMIDE 10 MG/ML IJ SOLN
40.0000 mg | Freq: Once | INTRAMUSCULAR | Status: AC
  Administered 2013-08-09: 40 mg via INTRAVENOUS
  Filled 2013-08-09: qty 4

## 2013-08-09 MED ORDER — ALPRAZOLAM 0.5 MG PO TABS
0.5000 mg | ORAL_TABLET | Freq: Three times a day (TID) | ORAL | Status: DC | PRN
  Administered 2013-08-10 – 2013-08-13 (×4): 0.5 mg via ORAL
  Filled 2013-08-09 (×4): qty 1

## 2013-08-09 MED ORDER — BUDESONIDE-FORMOTEROL FUMARATE 160-4.5 MCG/ACT IN AERO
2.0000 | INHALATION_SPRAY | Freq: Two times a day (BID) | RESPIRATORY_TRACT | Status: DC
  Administered 2013-08-10 – 2013-08-14 (×9): 2 via RESPIRATORY_TRACT
  Filled 2013-08-09: qty 6

## 2013-08-09 NOTE — Assessment & Plan Note (Signed)
CAP -right sided with associated pleural effusion ? parapneumonic effusion +/- CHF decompensation   Has finished 10 days of Avelox , no fever, minimal cough/congesiton  Suspect decompensated CHF may be main issue to poor recovery.  Pt has lost weight over last year -says intentional however if not improving may need further workup to evaluate.   Pt will need admission for further evaluation .  Case discussed with Dr. Kriste Basque  PCP , he was be transferred to [facility]  EMS contacted for transport  ER charge RN contacted with report.

## 2013-08-09 NOTE — Progress Notes (Signed)
Subjective:    Patient ID: Todd Kelly, male    DOB: 1931-06-09, 79 y.o.   MRN: [id number]  HPI 79 y/o WM here for a follow up visit... he has mult med problems including:  COPD/ AB/ ex-smoker;  HBP/ ASHD/ AFib/ Pacer- followed by DrSolomon/ [facility];  Cerebrovasc & peripheral vasc disease;  Hyperlipidemia/ Obesity;  DJD/ LBP;  Anxiety, etc...   07/23/13 Acute OV  Complains of increased SOB, prod cough with clear/light brown mucus, weakness/fatigue x10days - just finished avelox x7 days -last dose yesterday. .  denies wheezing, chest tightness, hemoptysis, edema, f/c/s Feeling some better but still weak with congested cough and dyspnea. Coughing up light brown mucus.  On prednisone 5mg  daily .  Eating well. No orthopnea, lower ext edema.  CXR today shows New small right pleural effusion and trace left pleural  fluid noted with patchy bibasilar airspace opacities. No hemoptysis, n//v/d, chest pain , hemoptysis.  No fever .  >exended avelox -tot 10d   07/26/13 Follow up PNA  Pt returns for 3 day follow up for PNA  Seen last ov with productive cough. CXR showed patchy bibasilar aspdz and smal right effusion  tx w/ 10 days course of avelox.  He returns today feeling some better, eating better.  Reports symptoms are slightly improved but still coughing and weak.  finished avelox yesterday CXR today with no change.  No hemoptysis, chest pain , orthopnea, edema, n/v, or fever. >>BNP elevated , rx Lasix 40mg  x 2 d.   08/09/2013 Follow up  Complains of 2 week follow up PNA. Was seen 2 weeks ago tx for CAP w/ CXR showing bibasilar aspdz with small to moderate effusion. Tx w/ 10 d course of Avelox. Follow up cxr on 10/9 w/ no change. Labs showed elevated BNP ~1100 , he was treated lasix 40mg  x 2 days then As needed  For swelling.  Returns today with no improvement in symptoms.  Continues to have dyspnea, orthopnea and weakness. Legs are more swollen.  Appetite is good, no n/v/d. Still has cough  and congestion with white mucus . No fever or hemoptysis.  Has hx of   Ischemic CM-EF 35-40 % and Atrial Fib on Xarelto . [facility] -Dr. Royann Shivers.          Problem List:  BRONCHITIS, CHRONIC, ACUTE EXACERBATION (ICD-491.21) >> see prev notes... COPD (ICD-496) - ex-smoker, freq flairs when off Rx... now taking: SYMBICORT 160- 2spBid,  SPIRIVA daily, SINGULAIR 10mg /d,  MUCINEX 1-2 Bid w/ fluids, + FLONASE... ~  baseline CXR shows COPD, prev median sternotomy, NAD.Marland KitchenMarland Kitchen  ~   CT CHEST 4/09 w/ extensive coronary calcif, prior CABG, no signif abn in the lungs. ~  PFT's 4/09 showed FVC=2.88 (71%), FEV1=2.13 (67%), FEV1/ FVC= 74%, mid-flows= 51%pred... ~  CXR 3/10 showed sl cardiomeg, pleural thickening, pacer, DJD sp, NAD. ~  CXR 12/11 showed s/p CABG, pacer on left, borderline cardiomeg, basilar scarring, NAD. ~  CXR 4/12 showed s/p CABG, pacer, extrapleural fat deposition bilat, NAD.[name]. ~  CXR 1/14 showed mod cardiomeg, prev median sternotomy, pacer, clear lungs & DJD spine... ~  7/14:  Pred is down to 5mg /d & he is much improved w/ the wt reduction...  OBSTRUCTIVE SLEEP APNEA (ICD-327.23) - he does not tolerate his CPAP...  HYPERTENSION (ICD-401.9) controlled on meds:  METOPROLOL 25mg Bid, COZAAR 100/d, LASIX 40mg /d; BP= 120/68 today & he denies CP, palpit, SOB, edema...  ARTERIOSCLEROTIC HEART DISEASE >> s/p CABG 2005 ISCHEMIC CARDIOMYOPATHY >> EF=35-40% ~  he is on ASA 81mg /d,  Now XARELTO 20mg /d, + meds as listed above... ~   CT CHEST 4/09 w/ extensive coronary calcif, prior CABG, no signif abn in the lungs. ~  cath 4/09 showed patent grafts, some disease in Circ & RCA, patent stent to left renal art... med Rx.  ~  2DEcho 5/11 showed mod conc LVH, mod reduced LVF w/ EF= 35-40% w/ apical AK, dil RA... ~  Nuclear Stress Test 11/11 showed a mild perfusion defect inferiorly consistent w/ a scar, global LVF is reduced w/ EF= 38%, dyssynchony due to RV apical pacing...  ATRIAL  FIBRILLATION & Tachy-Brady Syndrome >> COMPLETE HEART BLOCK & CARDIAC PACEMAKER IN SITU >> ~  he had syncope w/ 4 fx right ribs and CHB & AFib requiring pacer & Coumadin started... [facility] + DrCroitoru... f/u pacer checks OK & no changes made...  CEREBROVASCULAR DISEASE (ICD-437.9) - S/P Bilat CAE's 2000 & doppler's are followed by DrBerry...  ~  last CDoppler reported by [facility] 4/09 showed mod distal right CCA stenosis. ~  pt reports CDopplers done after 10/11 OV w/ DrSolomon was "OK" ~  DrC note of 4/13 indicates last CDoppler w/ 50-69%restenosis on the right;  He is due for repeat CDoppler soon...  PERIPHERAL VASCULAR DISEASE (ICD-443.9) - S/P Rt fem art thrombectomy 2000, and Rt renal art stent 2002... he has all this followed by [facility] now & last doppler & cath showed patent stent.  HYPERCHOLESTEROLEMIA (ICD-272.0) - controlled on SIMVASTATIN 80mg /d & Fish Oil and labs followed by DrBerry (pt will ask for copies to be sent to [place])... he recently added NIASPAN for HDL of 31, but this was stopped due to flushing/ intol...  ~  FLP 12/09 showed TChol 138, TG 220, HDL 34, LDL 60 ~  FLP 12/10 showed TChol 132, TG 253, HDL 33, LDL 61 ~  FLP 12/11 showed TChol 144, TG 200, HDL 33, LDL 71... advised low fat diet & get weight down ~  FLP 12/12 on Simva80 showed  TChol 156, TG 195, HDL 42, LDL 75; needs better low fat diet & wt reduction...  OBESITY (ICD-278.00) - weight fluctuates betw 240-250# & he has been unable to lose weight... he is sedentary and we discussed an exercise program for him, since he is limited by LBP. ~  NOTE:  labs 12/09 showed BS= 131, HgA1c= 6.1.[name]. advised low carb diet, get wt down! ~  6/10: wt today= 249#, but pt states down to 233# at home- "just decr portions" ~  12/10:  wt today= 252# post holiday... he states "I'm starving myself to death"... ~  17-Apr-2023:  weight = 252#.[name]. states he's lost 4" at the waist... ~  12/11:  weight = 252# ~  4/12:  Weight = 256# ~   6/12:  Weight = 262# ~  12/12:  Weight = 262# ~  6/13:  Weight = 246# ~  7/14:  Weight = 211#  DIVERTICULOSIS OF COLON (ICD-562.10) COLONIC POLYPS (ICD-211.3) HEMORRHOIDS (ICD-455.6) ~  last colonoscopy was 12/07 by DrStark and showed divertic, polyps 2-17mm size (adenomas), and hems... f/u planned 39yrs. ~  CT Abd&Pelvis 7/10 showed several sm gallstones in gallbladder, bilat renal cysts, 1.7cm left adrenal adenoma & 9mm right adrenal lesion, advanced atherosclerotic changes in distalAo & branches, sm bilat inguinal hernias containing fat; DDD & old healed right rib fx.  DEGENERATIVE JOINT DISEASE (ICD-715.90) LOW BACK PAIN, CHRONIC (ICD-724.2) ~  he uses DCN 100 for pain and prev requested  refill of #360 for 3 month supply... ~  12/10: DCN now off the market & Rx for Vicodin #270- up to 3/d as needed for pain. ~  12/12:  He is c/o incr back pain & he doesn't want to f/u w/ DrApling  ANXIETY (ICD-300.00) - he uses ALPRAZOLAM 0.5mg  Tid and prev requested #270 for 3 month supply...   Past Surgical History  Procedure Laterality Date  . Pta to right common femoral artery  2001    Dr. [name]  . Carotid endarterectomy  2002    bilateral.  Dr. Hart Rochester  . Coronary artery bypass graft  2005  . Pacemaker placement  July 2009    for CHB  Dr. Allyson Sabal. [employer]  . Appendectomy  1948  . US echocardiography  11/06/2011    Mod. LVH w/mod. depressed systolic function,EF 35-40%,gobal LV hypokinesis,mildly dilated LA,mild aortic root dilatation  . Nm myoview ltd  09/04/2010    mild perfusion defect basal inferior,mid inferior, & apical inferior regions. LV systolic function deteriorated since 2006  . Cardiac catheterization  02/02/2008    patent grafts    Outpatient Encounter Prescriptions as of 08/09/2013  Medication Sig Dispense Refill  . ALPRAZolam (XANAX) 0.5 MG tablet Take 1 tablet (0.5 mg total) by mouth 3 (three) times daily as needed for anxiety. Not to exceed 3 per day.  270 tablet  1  .  aspirin 81 MG tablet Take 81 mg by mouth daily.        . budesonide-formoterol (SYMBICORT) 160-4.5 MCG/ACT inhaler Inhale 2 puffs into the lungs 2 (two) times daily.  3 Inhaler  3  . Coenzyme Q10 (COQ10) 100 MG CAPS Take 1 capsule by mouth daily.        [name] diltiazem (CARDIZEM CD) 180 MG 24 hr capsule Take 180 mg by mouth daily.      [name] docusate sodium (COLACE) 100 MG capsule Take 2 tabs by mouth once daily       . fluticasone (FLONASE) 50 MCG/ACT nasal spray Place 1 spray into the nose 2 (two) times daily.  48 g  3  . furosemide (LASIX) 40 MG tablet Take as needed for swelling Per  Dr. Salena Saner.      . guaiFENesin (MUCINEX) 600 MG 12 hr tablet Take 1 to 2 tabs by mouth two times a day with fluids for thick phlegm       . hydrochlorothiazide (MICROZIDE) 12.5 MG capsule Take 12.5 mg by mouth daily.      [name] HYDROcodone-acetaminophen (NORCO/VICODIN) 5-325 MG per tablet Take 1/2 to 1 tablet by mouth three times daily as needed for pain  90 tablet  0  . losartan (COZAAR) 100 MG tablet Take 100 mg by mouth daily.        . meclizine (ANTIVERT) 25 MG tablet Take 1/2 to 1 tablet by mouth every 6 hours as needed for dizziness  90 tablet  3  . metoprolol tartrate (LOPRESSOR) 25 MG tablet Take 25 mg by mouth 2 (two) times daily. Per Dr. Salena Saner      . montelukast (SINGULAIR) 10 MG tablet Take 1 tablet (10 mg total) by mouth at bedtime.  90 tablet  3  . Omega-3 Fatty Acids (FISH OIL) 1000 MG CAPS Take 1 capsule by mouth daily.        . polyethylene glycol (MIRALAX / GLYCOLAX) packet Take 17 g by mouth daily.        . potassium chloride SA (K-DUR,KLOR-CON) 20 MEQ tablet Take 20 mEq  by mouth daily.      . predniSONE (DELTASONE) 5 MG tablet Take 2.5 mg by mouth daily.      . ranitidine (ZANTAC) 150 MG capsule Take 150 mg by mouth 2 (two) times daily.      . Rivaroxaban (XARELTO PO) Take 20 mg by mouth daily.       . simvastatin (ZOCOR) 80 MG tablet Take 80 mg by mouth at bedtime.        [name] tiotropium (SPIRIVA) 18 MCG inhalation  capsule Place 1 capsule (18 mcg total) into inhaler and inhale daily.  90 capsule  3  . vitamin C (ASCORBIC ACID) 500 MG tablet Take 500 mg by mouth daily.      . [DISCONTINUED] HYDROcodone-acetaminophen (NORCO/VICODIN) 5-325 MG per tablet Take 1/2 to 1 tablet by mouth three times daily as needed for pain  270 tablet  1   No facility-administered encounter medications on file as of 08/09/2013.    Allergies  Allergen Reactions  . Penicillins     REACTION: Allergic to PCN w/ throat swelling \\T \ hives    Current Medications, Allergies, Past Medical History, Past Surgical History, Family History, and Social History were reviewed in [employer] record.    Review of Systems       See HPI - all other systems neg except as noted...      The patient complains of dyspnea on exertion.  The patient denies anorexia, fever, weight loss, weight gain, vision loss, decreased hearing, hoarseness, chest pain, syncope   headaches, hemoptysis, abdominal pain, melena, hematochezia, severe indigestion/heartburn, hematuria, incontinence, muscle weakness, suspicious skin lesions, transient blindness, difficulty walking, depression, unusual weight change, abnormal bleeding, enlarged lymph nodes, and angioedema.     Objective:   Physical Exam     WD, Obese, 79 y/o WM in NAD in wheelchair  GENERAL:  Alert & oriented; pleasant & cooperative... HEENT:  [place]/AT,NOSE-clear, THROAT clear  NECK:  Supple w/ fair ROM; no JVD; s/p bilat CAE's w/ bruits; no thyromegaly or nodules palpated; no lymphadenopathy. CHEST:  decr BS w/ faint bibasilar crackles r>l  HEART:  Regular Rhythm; without murmurs/ rubs/ or gallops- median sternotomy scar...pacemaker in place ABDOMEN:  Obese, soft & nontender; normal bowel sounds; no organomegaly or masses detected. EXT: warm and dry, mild arthritic changes; no varicose veins/ +venous insuffic/ 1+ edema. NEURO:   ; no focal neuro deficits...  DERM: no lesions seen,  no rash etc...    CXR There is a small right pleural effusion with underlying consolidation at the right lung base.-unchanged     Recent Labs Lab 08/09/13 1504  PROBNP 1659.0*      Assessment & Plan:

## 2013-08-09 NOTE — ED Provider Notes (Signed)
CSN: [id number]     Arrival date & time 08/09/13  1707 History   First MD Initiated Contact with Patient 08/09/13 1722     Chief Complaint  Patient presents with  . Shortness of Breath    Patient is a 79 y.o. male presenting with shortness of breath. The history is provided by the patient and the spouse.  Shortness of Breath Severity:  Moderate Onset quality:  Gradual Duration: several weeks. Timing:  Intermittent Progression:  Worsening Chronicity:  New Relieved by:  Rest Worsened by:  Exertion Associated symptoms: cough   Associated symptoms: no chest pain and no vomiting   pt reports increasing fatigue and SOB for over 2 weeks He was recently treated for pneumonia and completed therapy with avelox recently He reports his SOB is worsening He was seen by pulmonologist today and sent for evaluation    Past Medical History  Diagnosis Date  . OSA (obstructive sleep apnea)   . Bronchial pneumonia   . Bronchitis, chronic with acute exacerbation   . COPD (chronic obstructive pulmonary disease)   . Hypertension   . Arteriosclerotic heart disease   . Atrial fibrillation   . Cardiac pacemaker in situ 04/12/2008    [employer] [place]  . Cerebrovascular disease   . Peripheral vascular disease   . Hypercholesteremia   . Obesity   . Diverticulosis of colon   . Colon polyps   . Hemorrhoids   . DJD (degenerative joint disease)   . Chronic low back pain   . Anxiety   . CHF (congestive heart failure)   . CHB (complete heart block)   . Carotid arterial disease   . CAD (coronary artery disease)   . S/P CABG (coronary artery bypass graft) 2005   Past Surgical History  Procedure Laterality Date  . Pta to right common femoral artery  2001    Dr. [name]  . Carotid endarterectomy  2002    bilateral.  Dr. Hart Rochester  . Coronary artery bypass graft  2005  . Pacemaker placement  July 2009    for CHB  Dr. Allyson Sabal. [employer] Jude  . Appendectomy  1948  . US echocardiography  11/06/2011    Mod. LVH  w/mod. depressed systolic function,EF 35-40%,gobal LV hypokinesis,mildly dilated LA,mild aortic root dilatation  . Nm myoview ltd  09/04/2010    mild perfusion defect basal inferior,mid inferior, & apical inferior regions. LV systolic function deteriorated since 2006  . Cardiac catheterization  02/02/2008    patent grafts   Family History  Problem Relation Age of Onset  . Asthma Paternal Uncle   . Heart disease Paternal Grandmother   . Heart disease Mother   . Heart attack Father   . Rheum arthritis Father   . Rheum arthritis Paternal Uncle   . Pancreatic cancer Mother   . Hyperlipidemia Mother    History  Substance Use Topics  . Smoking status: Former Smoker -- 1.50 packs/day for 54 years    Types: Cigarettes    Quit date: 10/18/1996  . Smokeless tobacco: Former Neurosurgeon    Types: Chew     Comment: only used chewing tobacco while working in the yard a couple of times  . Alcohol Use: No    Review of Systems  Constitutional: Positive for fatigue.  Respiratory: Positive for cough and shortness of breath.   Cardiovascular: Negative for chest pain.  Gastrointestinal: Negative for vomiting and blood in stool.  Neurological: Positive for light-headedness.  All other systems reviewed and are negative.  Allergies  Penicillins  Home Medications   Current Outpatient Rx  Name  Route  Sig  Dispense  Refill  . ALPRAZolam (XANAX) 0.5 MG tablet   Oral   Take 1 tablet (0.5 mg total) by mouth 3 (three) times daily as needed for anxiety. Not to exceed 3 per day.   270 tablet   1   . aspirin 81 MG tablet   Oral   Take 81 mg by mouth daily.           . budesonide-formoterol (SYMBICORT) 160-4.5 MCG/ACT inhaler   Inhalation   Inhale 2 puffs into the lungs 2 (two) times daily.   3 Inhaler   3   . Coenzyme Q10 (COQ10) 100 MG CAPS   Oral   Take 1 capsule by mouth daily.           [name] diltiazem (CARDIZEM CD) 180 MG 24 hr capsule   Oral   Take 180 mg by mouth daily.          [name] docusate sodium (COLACE) 100 MG capsule      Take 2 tabs by mouth once daily          . fluticasone (FLONASE) 50 MCG/ACT nasal spray   Nasal   Place 1 spray into the nose 2 (two) times daily.   48 g   3   . furosemide (LASIX) 40 MG tablet      Take as needed for swelling Per  Dr. Salena Saner.         . guaiFENesin (MUCINEX) 600 MG 12 hr tablet      Take 1 to 2 tabs by mouth two times a day with fluids for thick phlegm          . hydrochlorothiazide (MICROZIDE) 12.5 MG capsule   Oral   Take 12.5 mg by mouth daily.         [name] HYDROcodone-acetaminophen (NORCO/VICODIN) 5-325 MG per tablet      Take 1/2 to 1 tablet by mouth three times daily as needed for pain   90 tablet   0   . losartan (COZAAR) 100 MG tablet   Oral   Take 100 mg by mouth daily.           . meclizine (ANTIVERT) 25 MG tablet      Take 1/2 to 1 tablet by mouth every 6 hours as needed for dizziness   90 tablet   3   . metoprolol tartrate (LOPRESSOR) 25 MG tablet   Oral   Take 25 mg by mouth 2 (two) times daily. Per Dr. Salena Saner         . montelukast (SINGULAIR) 10 MG tablet   Oral   Take 1 tablet (10 mg total) by mouth at bedtime.   90 tablet   3   . Omega-3 Fatty Acids (FISH OIL) 1000 MG CAPS   Oral   Take 1 capsule by mouth daily.           . polyethylene glycol (MIRALAX / GLYCOLAX) packet   Oral   Take 17 g by mouth daily.           . potassium chloride SA (K-DUR,KLOR-CON) 20 MEQ tablet   Oral   Take 20 mEq by mouth daily.         . predniSONE (DELTASONE) 5 MG tablet   Oral   Take 2.5 mg by mouth daily.         . ranitidine (ZANTAC) 150 MG  capsule   Oral   Take 150 mg by mouth 2 (two) times daily.         . Rivaroxaban (XARELTO PO)   Oral   Take 20 mg by mouth daily.          . simvastatin (ZOCOR) 80 MG tablet   Oral   Take 80 mg by mouth at bedtime.           [name] tiotropium (SPIRIVA) 18 MCG inhalation capsule   Inhalation   Place 1 capsule (18 mcg total) into  inhaler and inhale daily.   90 capsule   3   . vitamin C (ASCORBIC ACID) 500 MG tablet   Oral   Take 500 mg by mouth daily.          BP 125/52  Pulse 76  Temp(Src) 97.8 F (36.6 C) (Oral)  Resp 30  Ht 5\' 9"  (1.753 m)  Wt 201 lb 4.8 oz (91.309 kg)  BMI 29.71 kg/m2  SpO2 97% Physical Exam CONSTITUTIONAL: Well developed/well nourished HEAD: Normocephalic/atraumatic EYES: EOMI/PERRL ENMT: Mucous membranes moist NECK: supple no meningeal signs SPINE:entire spine nontender CV: S1/S2 noted, no murmurs/rubs/gallops noted LUNGS: mildly tachypneic.  Coarse BS noted bilaterally ABDOMEN: soft, nontender, no rebound or guarding GU:no cva tenderness NEURO: Pt is awake/alert, moves all extremitiesx4 EXTREMITIES: pulses normal, full ROM, minimal symmetric pitting edema to bilateral LE SKIN: warm, color normal PSYCH: no abnormalities of mood noted  ED Course  Procedures (including critical care time) Labs Review Labs Reviewed  CBC WITH DIFFERENTIAL     6:20 PM Pt with increasing dyspnea on exertion, no active CP. Suspicions for decompensated CHF Doubt PE a this time Records from office visit today reviewed  8:20 PM D/w dr Onalee Hua, will admit to tele  Imaging Review Dg Chest 2 View  08/09/2013   CLINICAL DATA:  Cough, shortness of breath.  EXAM: CHEST  2 VIEW  COMPARISON:  July 26, 2013.  FINDINGS: Stable mild cardiomegaly. Status post coronary artery bypass graft. 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 Left lung is clear. Mild right pleural effusion is noted which is stable ; underlying pneumonia or atelectasis cannot be excluded. No pneumothorax is noted. Bony thorax is intact.  IMPRESSION: Stable mild right pleural effusion compared to prior exam.   Electronically Signed   By: Roque Lias M.D.   On: 08/09/2013 15:23    EKG Interpretation     Ventricular Rate:  65 PR Interval:  184 QRS Duration: 175 QT Interval:  483 QTC Calculation: 502 R Axis:   -82 Text  Interpretation:  Sinus rhythm Supraventricular bigeminy Left bundle branch block likely paced rhythm when compared to prior            MDM  No diagnosis found.  xrays reviewed and considered Labs/vital reviewed and considered Previous records reviewed and considered Nursing notes including past medical history and social history reviewed and considered in documentation     Joya Gaskins, MD 08/09/13 2020

## 2013-08-09 NOTE — ED Notes (Signed)
Attempted report x 2 

## 2013-08-09 NOTE — ED Notes (Signed)
Transporting patient to new room assignment. 

## 2013-08-09 NOTE — ED Notes (Signed)
Second RN at bedside attempting IV and lab a bedside.

## 2013-08-09 NOTE — Assessment & Plan Note (Signed)
Ischemia CM with EF 35-40% with suspsected CHF decompensation  BNP tr up 1100>1600~  Unresponsive to OP therapy.  Will send to ER for further treatement/evaluation -consideration for admission.  He is on Xarelto for chronic Afib.  Case reviewed in detail with Dr. Kriste Basque   EMS transport to ER at [facility]

## 2013-08-09 NOTE — ED Notes (Addendum)
Patient states he has the urgency to urinate about every  5-10 minutes.

## 2013-08-09 NOTE — H&P (Signed)
PCP:   Michele Mcalpine, MD   Chief Complaint:  sob  HPI: 79 yo male with h/o afib, copd, chf, cabg, pacer, comes in with worsening sob and le edema for the last several days.  Pt was recenlty dx with cap and completed 10 days of avelox.  He had improved with steroids and avelox, cxr showed rt effusion with possible consolidation.  Pt denies any fevers.  Then over the last couple of days he has had worsening sob esp with exertion.  No cp.  With orthopnea and pnd for 2 nights now which is new.  No n/v.  Has had a cough nonproductive.  Since arrival to ED he has had lasix iv and has had good diuresis and feels much better.  Does not require oxygen at home.  No pain.  Review of Systems:  Positive and negative as per HPI otherwise all other systems are negative  Past Medical History: Past Medical History  Diagnosis Date  . OSA (obstructive sleep apnea)   . Bronchial pneumonia   . Bronchitis, chronic with acute exacerbation   . COPD (chronic obstructive pulmonary disease)   . Hypertension   . Arteriosclerotic heart disease   . Atrial fibrillation   . Cardiac pacemaker in situ 04/12/2008    [employer]  . Cerebrovascular disease   . Peripheral vascular disease   . Hypercholesteremia   . Obesity   . Diverticulosis of colon   . Colon polyps   . Hemorrhoids   . DJD (degenerative joint disease)   . Chronic low back pain   . Anxiety   . CHF (congestive heart failure)   . CHB (complete heart block)   . Carotid arterial disease   . CAD (coronary artery disease)   . S/P CABG (coronary artery bypass graft) 2005   Past Surgical History  Procedure Laterality Date  . Pta to right common femoral artery  2001    Dr. [name]  . Carotid endarterectomy  2002    bilateral.  Dr. Hart Rochester  . Coronary artery bypass graft  2005  . Pacemaker placement  July 2009    for CHB  Dr. Allyson Sabal. [employer]  . Appendectomy  1948  . US echocardiography  11/06/2011    Mod. LVH w/mod. depressed systolic function,EF  35-40%,gobal LV hypokinesis,mildly dilated LA,mild aortic root dilatation  . Nm myoview ltd  09/04/2010    mild perfusion defect basal inferior,mid inferior, & apical inferior regions. LV systolic function deteriorated since 2006  . Cardiac catheterization  02/02/2008    patent grafts    Medications: Prior to Admission medications   Medication Sig Start Date End Date Taking? Authorizing Provider  ALPRAZolam Prudy Feeler) 0.5 MG tablet Take 1 tablet (0.5 mg total) by mouth 3 (three) times daily as needed for anxiety. Not to exceed 3 per day. 04/24/13  Yes Michele Mcalpine, MD  aspirin 81 MG tablet Take 81 mg by mouth daily.     Yes Historical Provider, MD  budesonide-formoterol (SYMBICORT) 160-4.5 MCG/ACT inhaler Inhale 2 puffs into the lungs 2 (two) times daily. 10/23/12  Yes Michele Mcalpine, MD  Coenzyme Q10 (COQ10) 100 MG CAPS Take 100 mg by mouth daily.    Yes Historical Provider, MD  Dextromethorphan-Guaifenesin (MUCINEX DM MAXIMUM STRENGTH) 60-1200 MG TB12 Take 1 tablet by mouth 2 (two) times daily.   Yes Historical Provider, MD  diltiazem (CARDIZEM CD) 180 MG 24 hr capsule Take 180 mg by mouth daily.   Yes Historical Provider, MD  docusate  sodium (COLACE) 100 MG capsule Take 200 mg by mouth daily.    Yes Historical Provider, MD  fluticasone (FLONASE) 50 MCG/ACT nasal spray Place 1 spray into the nose 2 (two) times daily. 10/23/12  Yes Michele Mcalpine, MD  furosemide (LASIX) 40 MG tablet Take 40 mg by mouth daily as needed for fluid.    Yes Historical Provider, MD  HYDROcodone-acetaminophen (NORCO/VICODIN) 5-325 MG per tablet Take 1/2 to 1 tablet by mouth three times daily as needed for pain 08/09/13  Yes Tammy S Parrett, NP  losartan (COZAAR) 100 MG tablet Take 100 mg by mouth at bedtime.    Yes Historical Provider, MD  meclizine (ANTIVERT) 25 MG tablet Take 1/2 to 1 tablet by mouth every 6 hours as needed for dizziness 10/23/12  Yes Michele Mcalpine, MD  metoprolol tartrate (LOPRESSOR) 25 MG tablet Take 25 mg by  mouth 2 (two) times daily.    Yes Historical Provider, MD  montelukast (SINGULAIR) 10 MG tablet Take 1 tablet (10 mg total) by mouth at bedtime. 10/23/12  Yes Michele Mcalpine, MD  Omega-3 Fatty Acids (FISH OIL) 1000 MG CAPS Take 1 capsule by mouth daily.     Yes Historical Provider, MD  polyethylene glycol (MIRALAX / GLYCOLAX) packet Take 17 g by mouth daily as needed.    Yes Historical Provider, MD  potassium chloride SA (K-DUR,KLOR-CON) 20 MEQ tablet Take 20 mEq by mouth daily.   Yes Historical Provider, MD  predniSONE (DELTASONE) 5 MG tablet Take 5 mg by mouth daily.  03/20/13  Yes Michele Mcalpine, MD  ranitidine (ZANTAC) 150 MG capsule Take 150 mg by mouth 2 (two) times daily.   Yes Historical Provider, MD  Rivaroxaban (XARELTO) 20 MG TABS tablet Take 20 mg by mouth daily with supper.   Yes Historical Provider, MD  simvastatin (ZOCOR) 80 MG tablet Take 80 mg by mouth at bedtime.     Yes Historical Provider, MD  tiotropium (SPIRIVA) 18 MCG inhalation capsule Place 1 capsule (18 mcg total) into inhaler and inhale daily. 10/23/12  Yes Michele Mcalpine, MD  vitamin C (ASCORBIC ACID) 500 MG tablet Take 500 mg by mouth daily.   Yes Historical Provider, MD    Allergies:   Allergies  Allergen Reactions  . Penicillins Anaphylaxis and Hives    REACTION: Allergic to PCN w/ throat swelling \\T \ hives    Social History:  reports that he quit smoking about 16 years ago. His smoking use included Cigarettes. He has a 81 pack-year smoking history. He has quit using smokeless tobacco. His smokeless tobacco use included Chew. He reports that he does not drink alcohol or use illicit drugs.  Family History: Family History  Problem Relation Age of Onset  . Asthma Paternal Uncle   . Heart disease Paternal Grandmother   . Heart disease Mother   . Heart attack Father   . Rheum arthritis Father   . Rheum arthritis Paternal Uncle   . Pancreatic cancer Mother   . Hyperlipidemia Mother     Physical Exam: Filed  Vitals:   08/09/13 1720 08/09/13 1730  BP: 125/52 133/58  Pulse: 76 65  Temp: 97.8 F (36.6 C)   TempSrc: Oral   Resp: 30 25  Height: 5\' 9"  (1.753 m)   Weight: 91.309 kg (201 lb 4.8 oz)   SpO2: 97% 99%   General appearance: alert, cooperative and no distress Head: Normocephalic, without obvious abnormality, atraumatic Eyes: negative Nose: Nares normal. Septum midline. Mucosa normal.  No drainage or sinus tenderness. Neck: no JVD and supple, symmetrical, trachea midline Lungs: diminished breath sounds RLL no w/r/r or crackles Heart: regular rate and rhythm, S1, S2 normal, no murmur, click, rub or gallop Abdomen: soft, non-tender; bowel sounds normal; no masses,  no organomegaly Extremities: edema 2+ ble piting edema to feet, mild ble ankles Pulses: 2+ and symmetric Skin: Skin color, texture, turgor normal. No rashes or lesions Neurologic: Grossly normal    Labs on Admission:   Recent Labs  08/09/13 1504  NA 142  K 3.9  CL 104  CO2 32  GLUCOSE 137*  BUN 21  CREATININE 0.8  CALCIUM 8.5    Recent Labs  08/09/13 1850  WBC 9.3  NEUTROABS 7.3  HGB 11.5*  HCT 37.5*  MCV 80.1  PLT 166    Recent Labs  08/09/13 1850  TROPONINI <0.30   Radiological Exams on Admission: Dg Chest 2 View  08/09/2013   CLINICAL DATA:  Cough, shortness of breath.  EXAM: CHEST  2 VIEW  COMPARISON:  July 26, 2013.  FINDINGS: Stable mild cardiomegaly. Status post coronary artery bypass graft. 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 Left lung is clear. Mild right pleural effusion is noted which is stable ; underlying pneumonia or atelectasis cannot be excluded. No pneumothorax is noted. Bony thorax is intact.  IMPRESSION: Stable mild right pleural effusion compared to prior exam.   Electronically Signed   By: Roque Lias M.D.   On: 08/09/2013 15:23   Assessment/Plan  79 yo male with sob and acute chf exacerbation probable systolic last EF 16%  Principal Problem:   CHF exacerbation- chf  pathway, place on lasix 40mg  iv bid.  Serial enzymes, ck 2D echo in am.  Hopefully will cont to improve with diuresis.  Wbc nml no fever at this time, will hold off on any further abx or steroids, just cont his taper he is on.    Active Problems:   OBSTRUCTIVE SLEEP APNEA   HYPERTENSION   ATRIAL FIBRILLATION, paroxysmal   COPD   Cardiac pacemaker in situ   Cardiomyopathy, ischemic   CAD (coronary artery disease) s/p CABG   SOB (shortness of breath)    Jaecion Dempster A 08/09/2013, 8:44 PM

## 2013-08-09 NOTE — ED Notes (Signed)
This RN attempted IV x2 but was unsuccessful. Pt tolerated well.

## 2013-08-09 NOTE — ED Notes (Signed)
Per EMS- pt was at [facility] today. Pt was sent here for increased SOB. Denies chest pain, tightness or pressure. Pt on 2L oxygen. Pt SOB worse with exertion.

## 2013-08-10 ENCOUNTER — Telehealth: Payer: Self-pay | Admitting: Cardiovascular Disease

## 2013-08-10 DIAGNOSIS — I10 Essential (primary) hypertension: Secondary | ICD-10-CM

## 2013-08-10 DIAGNOSIS — I369 Nonrheumatic tricuspid valve disorder, unspecified: Secondary | ICD-10-CM

## 2013-08-10 DIAGNOSIS — I5023 Acute on chronic systolic (congestive) heart failure: Principal | ICD-10-CM

## 2013-08-10 DIAGNOSIS — I442 Atrioventricular block, complete: Secondary | ICD-10-CM

## 2013-08-10 LAB — TROPONIN I
Troponin I: <0.3 ng/mL (ref <0.30)
Troponin I: <0.3 ng/mL (ref <0.30)
Troponin I: <0.3 ng/mL (ref <0.30)

## 2013-08-10 LAB — BASIC METABOLIC PANEL
GFR calc Af Amer: 90 mL/min — ABNORMAL LOW (ref >90)
GFR calc non Af Amer: 78 mL/min — ABNORMAL LOW (ref >90)
Potassium: 3.5 mEq/L (ref 3.5–5.1)
Sodium: 144 mEq/L (ref 135–145)

## 2013-08-10 MED ORDER — SODIUM CHLORIDE 0.9 % IJ SOLN: 3.0000 mL | INTRAMUSCULAR | Status: DC | PRN

## 2013-08-10 MED ORDER — SODIUM CHLORIDE 0.9 % IJ SOLN
3.0000 mL | Freq: Two times a day (BID) | INTRAMUSCULAR | Status: DC
  Administered 2013-08-10 – 2013-08-13 (×7): 3 mL via INTRAVENOUS

## 2013-08-10 MED ORDER — OXYCODONE HCL 5 MG PO TABS
5.0000 mg | ORAL_TABLET | ORAL | Status: DC | PRN
  Administered 2013-08-10 – 2013-08-14 (×6): 5 mg via ORAL
  Filled 2013-08-10 (×6): qty 1

## 2013-08-10 MED ORDER — OXYCODONE HCL 5 MG PO TABS
5.0000 mg | ORAL_TABLET | Freq: Once | ORAL | Status: AC
  Administered 2013-08-10: 14:00:00 5 mg via ORAL
  Filled 2013-08-10: qty 1

## 2013-08-10 MED ORDER — HYDROCORTISONE 1 % EX CREA
1.0000 "application " | TOPICAL_CREAM | Freq: Three times a day (TID) | CUTANEOUS | Status: DC | PRN
  Filled 2013-08-10: qty 28

## 2013-08-10 MED ORDER — SODIUM CHLORIDE 0.9 % IV SOLN: 250.0000 mL | INTRAVENOUS | Status: DC

## 2013-08-10 NOTE — Telephone Encounter (Signed)
I called Todd Kelly and she will check in on him

## 2013-08-10 NOTE — Consult Note (Signed)
Pt. Seen and examined. Agree with the NP/PA-C note as written.  Todd Kelly indeed has decompensated systolic heart failure. It does seem that pacer dyssynchrony may be playing a role in this. I have reviewed his echocardiogram. His EF is 30-35% by my measurements with elevated LV filling pressure noted.  He also appears to be in atrial fibrillation. It may be helpful to interrogate his device to better understand his burden of atrial fibrillation.  I suspect volume impedence will be up.  BNP is elevated at 1659.  Troponin has been negative x 4. Weight has come down to 199 lbs from 201 lbs since admit - He has recently had 30 lb weight loss, therefore, prior dry weight of 225 lbs is innacurate. Weight in the office in 04/2013 was 213 lbs.  Impression: 1. Acute on chronic systolic heart failure exacerbation, NYHA Class IV, EF now 30-35% 2. S/p Dual chamber pacemaker 3. Atrial fibrillation with unknown burden  Recommendations: 1. AGree with increased diuresis - not ready for discharge. 2.  Interrogate pacemaker, it appears he is in a-fib. May benefit from DCCV in am tomorrow as EF is depressed. 3. Ultimately will likely need CRT +/- D device.  We will continue to follow with you.  Chrystie Nose, MD, [facility] Attending Cardiologist [facility]

## 2013-08-10 NOTE — Telephone Encounter (Signed)
Todd Kelly said her husband put in Hospital last night after going to ER.  Wants Dr C to know about it and says she thinks they to release him today CHF not pneumonia .  She doesn't feel he ready to leave hosp.Algis Downs I would certainly let Dr C know and that DR C in our office today. She fully understood and says will speak with our Dr/Pa in hospital with her concerns.

## 2013-08-10 NOTE — Progress Notes (Signed)
Pt's family has asked [place] to see the pt.  Pro BNP is still elevated and this is a strong predictor for readmit for CHF.  Echo done, results pending.

## 2013-08-10 NOTE — Progress Notes (Signed)
[facility] PROGRESS NOTE  Todd Kelly ZOX:[id number] DOB: [birth year] DOA: 08/09/2013 PCP: Michele Mcalpine, MD  Assessment/Plan: 1. Acute decompensated congestive heart failure, systolic. Patient presented with clinical signs and symptoms consistent with acute CHF, started on Lasix 40 mg IV twice a day. Transthoracic echocardiogram today showed an ejection fraction of 30-35% with severe global hypokinesis and inferior akinesis. This is slightly worse from previous transthoracic echocardiogram performed 10/27/2011 which showed an ejection fraction of 35-40%. Patient was seen and evaluated by cardiology today recommending ongoing diuresis. Of note he has had 3 negative troponins. Appreciate cardiology consultation 2. Paroxysmal atrial fibrillation. Patient on chronic anticoagulation with is a Xarelto. Currently rate controlled, on beta blocker and calcium channel blocker therapy.  3. Status post pacemaker implant. Heart is recommending interrogation of pacemaker.  4. Coronary artery disease. Having 3 negative troponins. Continue aspirin and beta blocker.  5. Hypertension. Patient on Cardizem and metoprolol, blood pressure stable, having her last blood pressure 122/64.  6. DVT prophylaxis. Patient on Xarelto.   Code Status: Full code Family Communication: Plan discussed with patient's wife present at bedside  Disposition Plan: continue diuresis   Consultants:  Cardiology  Procedures:  Transthoracic echocardiogram showing an ejection fraction of 30-35% with global hypokinesis and inferior akinesis.   HPI/Subjective: Patient having a total urinary output of 875 since overnight. Continues to have edema though is currently not in acute respiratory distress. He has tolerated by mouth intake, denies chest pain, palpitations, dizziness.   Objective: Filed Vitals:   08/10/13 1500  BP: 122/64  Pulse: 60  Temp: 98 F (36.7 C)  Resp: 18    Intake/Output Summary (Last 24 hours) at  08/10/13 1636 Last data filed at 08/10/13 1600  Gross per 24 hour  Intake    823 ml  Output   1475 ml  Net   -652 ml   Filed Weights   08/09/13 2127 08/09/13 2158 08/10/13 0606  Weight: 91.173 kg (201 lb) 90.992 kg (200 lb 9.6 oz) 90.402 kg (199 lb 4.8 oz)    Exam:   General:  Patient is in no acute distress awake alert  Cardiovascular: Regular rate and rhythm, has 2+ bilateral extremity pitting edema   Respiratory: Positive bibasilar crackles  Abdomen: Soft nontender nondistended  Musculoskeletal: +2 bilateral extremity pitting edema  Data Reviewed: Basic Metabolic Panel:  Recent Labs Lab 08/09/13 1504 08/10/13 0525  NA 142 144  K 3.9 3.5  CL 104 104  CO2 32 32  GLUCOSE 137* 89  BUN 21 21  CREATININE 0.8 0.92  CALCIUM 8.5 9.0   Liver Function Tests: No results found for this basename: AST, ALT, ALKPHOS, BILITOT, PROT, ALBUMIN,  in the last 168 hours No results found for this basename: LIPASE, AMYLASE,  in the last 168 hours No results found for this basename: AMMONIA,  in the last 168 hours CBC:  Recent Labs Lab 08/09/13 1850  WBC 9.3  NEUTROABS 7.3  HGB 11.5*  HCT 37.5*  MCV 80.1  PLT 166   Cardiac Enzymes:  Recent Labs Lab 08/09/13 1850 08/09/13 2350 08/10/13 0351 08/10/13 0825  TROPONINI <0.30 <0.30 <0.30 <0.30   BNP (last 3 results)  Recent Labs  07/23/13 1621 08/09/13 1504  PROBNP 1155.0* 1659.0*   CBG: No results found for this basename: GLUCAP,  in the last 168 hours  No results found for this or any previous visit (from the past 240 hour(s)).   Studies: Dg Chest 2 View  08/09/2013   CLINICAL DATA:  Cough, shortness of breath.  EXAM: CHEST  2 VIEW  COMPARISON:  July 26, 2013.  FINDINGS: Stable mild cardiomegaly. Status post coronary artery bypass graft. 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 Left lung is clear. Mild right pleural effusion is noted which is stable ; underlying pneumonia or atelectasis cannot be excluded. No  pneumothorax is noted. Bony thorax is intact.  IMPRESSION: Stable mild right pleural effusion compared to prior exam.   Electronically Signed   By: Roque Lias M.D.   On: 08/09/2013 15:23    Scheduled Meds: . aspirin  81 mg Oral Daily  . budesonide-formoterol  2 puff Inhalation BID  . diltiazem  180 mg Oral Daily  . docusate sodium  200 mg Oral Daily  . furosemide  40 mg Intravenous Q12H  . metoprolol tartrate  25 mg Oral BID  . montelukast  10 mg Oral QHS  . potassium chloride SA  20 mEq Oral Daily  . predniSONE  5 mg Oral Daily  . Rivaroxaban  20 mg Oral Q supper  . sodium chloride  3 mL Intravenous Q12H  . tiotropium  18 mcg Inhalation Daily  . vitamin C  500 mg Oral Daily   Continuous Infusions:   Principal Problem:   Acute on chronic systolic CHF (congestive heart failure) Active Problems:   OBSTRUCTIVE SLEEP APNEA   HYPERTENSION   ATRIAL FIBRILLATION, paroxysmal   COPD   Cardiac pacemaker in situ   Cardiomyopathy, ischemic   CAD (coronary artery disease) s/p CABG   SOB (shortness of breath)    Time spent: 35 minutes    Jeralyn Bennett  [facility] Pager [phone]. If 7PM-7AM, please contact night-coverage at [email or web address], password [facility] 08/10/2013, 4:36 PM  LOS: 1 day

## 2013-08-10 NOTE — Progress Notes (Signed)
  Echocardiogram 2D Echocardiogram has been performed.  [place] 08/10/2013, 10:58 AM

## 2013-08-10 NOTE — Consult Note (Signed)
Note: The patient's wife contacted our office today asking that we assess the patient prior to discharge. Dr. Royann Shivers, his primary cardiologist, was notified and agreed that we will see the patient as requested.    Reason for Consult: CHF vs PNA  HPI: The patient is a 79 y/o male, followed by Dr. Royann Shivers. His cardiac history is significant for CAD, s/p CAGB 10 years ago. He received a LIMA to LAD and two SVG to ramus intermedius 1 and ramus intermedius 2. Bypasses were patent at cardiac catheterization in 2009. Nuclear stress testing in 2011 showed an extensive inferior wall scar without reversibility. He has an ischemic cardiomyopathy. His most recent echocardiogram from January of 2013 shows an ejection fraction of 35-40%. Part of the reduction in systolic function is related to pacing induced dyssynchrony. He has a cardiac pacemaker (St. Jude), implanted in 2009, due to CHB. He is pacer dependent and 100% ventricularly paced. He also has a history of PAF, on Xarelto for anticoagulation. He also has COPD.   He was recently diagnosed and treated for CAP as an outpatient. He was treated with a 10 day course of antibiotics and his daily steroid dose was increased from 5 mg to 10 mg. He finished treatment 2 weeks ago. Despite these measures, his symptoms worsened. He presented to [facility] last night with a complaint of worsening SOB and lower extremity edema. He notes associated orthopnea and PND. Work-up revealed an elevated BNP of 1,659. CXR demonstrated a right sided pleural effusion. He was admitted by Internal Medicine and placed on IV Lasix. He feels slightly better, but continues to have significant SOB ambulating short distances to the restroom. He continues to endorse LEE. Denies chest pain.     Past Medical History  Diagnosis Date  . OSA (obstructive sleep apnea)   . Bronchial pneumonia   . Bronchitis, chronic with acute exacerbation   . COPD (chronic obstructive pulmonary disease)   .  Hypertension   . Arteriosclerotic heart disease   . Atrial fibrillation   . Cardiac pacemaker in situ 04/12/2008    St.Jude Zephyr  . Cerebrovascular disease   . Peripheral vascular disease   . Hypercholesteremia   . Obesity   . Diverticulosis of colon   . Colon polyps   . Hemorrhoids   . DJD (degenerative joint disease)   . Chronic low back pain   . Anxiety   . CHF (congestive heart failure)   . CHB (complete heart block)   . Carotid arterial disease   . CAD (coronary artery disease)   . S/P CABG (coronary artery bypass graft) 2005    Past Surgical History  Procedure Laterality Date  . Pta to right common femoral artery  2001    Dr. [name]  . Carotid endarterectomy  2002    bilateral.  Dr. Hart Rochester  . Coronary artery bypass graft  2005  . Pacemaker placement  July 2009    for CHB  Dr. Allyson Sabal. St. Jude  . Appendectomy  1948  . US echocardiography  11/06/2011    Mod. LVH w/mod. depressed systolic function,EF 35-40%,gobal LV hypokinesis,mildly dilated LA,mild aortic root dilatation  . Nm myoview ltd  09/04/2010    mild perfusion defect basal inferior,mid inferior, & apical inferior regions. LV systolic function deteriorated since 2006  . Cardiac catheterization  02/02/2008    patent grafts    Family History  Problem Relation Age of Onset  . Asthma Paternal Uncle   . Heart disease Paternal Grandmother   .  Heart disease Mother   . Heart attack Father   . Rheum arthritis Father   . Rheum arthritis Paternal Uncle   . Pancreatic cancer Mother   . Hyperlipidemia Mother    Social History:  reports that he quit smoking about 16 years ago. His smoking use included Cigarettes. He has a 81 pack-year smoking history. He has quit using smokeless tobacco. His smokeless tobacco use included Chew. He reports that he does not drink alcohol or use illicit drugs.  Allergies:  Allergies  Allergen Reactions  . Penicillins Anaphylaxis and Hives    REACTION: Allergic to PCN w/ throat  swelling \\T \ hives    Medications Prior to Admission  Medication Sig Dispense Refill  . ALPRAZolam (XANAX) 0.5 MG tablet Take 1 tablet (0.5 mg total) by mouth 3 (three) times daily as needed for anxiety. Not to exceed 3 per day.  270 tablet  1  . aspirin 81 MG tablet Take 81 mg by mouth daily.        . budesonide-formoterol (SYMBICORT) 160-4.5 MCG/ACT inhaler Inhale 2 puffs into the lungs 2 (two) times daily.  3 Inhaler  3  . Coenzyme Q10 (COQ10) 100 MG CAPS Take 100 mg by mouth daily.       [name] Dextromethorphan-Guaifenesin (MUCINEX DM MAXIMUM STRENGTH) 60-1200 MG TB12 Take 1 tablet by mouth 2 (two) times daily.      [name] diltiazem (CARDIZEM CD) 180 MG 24 hr capsule Take 180 mg by mouth daily.      [name] docusate sodium (COLACE) 100 MG capsule Take 200 mg by mouth daily.       . fluticasone (FLONASE) 50 MCG/ACT nasal spray Place 1 spray into the nose 2 (two) times daily.  48 g  3  . furosemide (LASIX) 40 MG tablet Take 40 mg by mouth daily as needed for fluid.       [name] HYDROcodone-acetaminophen (NORCO/VICODIN) 5-325 MG per tablet Take 1/2 to 1 tablet by mouth three times daily as needed for pain  90 tablet  0  . losartan (COZAAR) 100 MG tablet Take 100 mg by mouth at bedtime.       . meclizine (ANTIVERT) 25 MG tablet Take 1/2 to 1 tablet by mouth every 6 hours as needed for dizziness  90 tablet  3  . metoprolol tartrate (LOPRESSOR) 25 MG tablet Take 25 mg by mouth 2 (two) times daily.       . montelukast (SINGULAIR) 10 MG tablet Take 1 tablet (10 mg total) by mouth at bedtime.  90 tablet  3  . Omega-3 Fatty Acids (FISH OIL) 1000 MG CAPS Take 1 capsule by mouth daily.        . polyethylene glycol (MIRALAX / GLYCOLAX) packet Take 17 g by mouth daily as needed.       . potassium chloride SA (K-DUR,KLOR-CON) 20 MEQ tablet Take 20 mEq by mouth daily.      . predniSONE (DELTASONE) 5 MG tablet Take 5 mg by mouth daily.       . ranitidine (ZANTAC) 150 MG capsule Take 150 mg by mouth 2 (two) times daily.      .  Rivaroxaban (XARELTO) 20 MG TABS tablet Take 20 mg by mouth daily with supper.      . simvastatin (ZOCOR) 80 MG tablet Take 80 mg by mouth at bedtime.        [name] tiotropium (SPIRIVA) 18 MCG inhalation capsule Place 1 capsule (18 mcg total) into inhaler and inhale daily.  90  capsule  3  . vitamin C (ASCORBIC ACID) 500 MG tablet Take 500 mg by mouth daily.        Results for orders placed during the hospital encounter of 08/09/13 (from the past 48 hour(s))  CBC WITH DIFFERENTIAL     Status: Abnormal   Collection Time    08/09/13  6:50 PM      Result Value Range   WBC 9.3  4.0 - 10.5 K/uL   RBC 4.68  4.22 - 5.81 MIL/uL   Hemoglobin 11.5 (*) 13.0 - 17.0 g/dL   HCT 40.9 (*) 81.1 - 91.4 %   MCV 80.1  78.0 - 100.0 fL   MCH 24.6 (*) 26.0 - 34.0 pg   MCHC 30.7  30.0 - 36.0 g/dL   RDW 78.2 (*) 95.6 - 21.3 %   Platelets 166  150 - 400 K/uL   Neutrophils Relative % 79 (*) 43 - 77 %   Neutro Abs 7.3  1.7 - 7.7 K/uL   Lymphocytes Relative 12  12 - 46 %   Lymphs Abs 1.1  0.7 - 4.0 K/uL   Monocytes Relative 9  3 - 12 %   Monocytes Absolute 0.8  0.1 - 1.0 K/uL   Eosinophils Relative 0  0 - 5 %   Eosinophils Absolute 0.0  0.0 - 0.7 K/uL   Basophils Relative 0  0 - 1 %   Basophils Absolute 0.0  0.0 - 0.1 K/uL  TROPONIN I     Status: None   Collection Time    08/09/13  6:50 PM      Result Value Range   Troponin I <0.30  <0.30 ng/mL   Comment:            Due to the release kinetics of cTnI,     a negative result within the first hours     of the onset of symptoms does not rule out     myocardial infarction with certainty.     If myocardial infarction is still suspected,     repeat the test at appropriate intervals.  TROPONIN I     Status: None   Collection Time    08/09/13 11:50 PM      Result Value Range   Troponin I <0.30  <0.30 ng/mL   Comment:            Due to the release kinetics of cTnI,     a negative result within the first hours     of the onset of symptoms does not rule out      myocardial infarction with certainty.     If myocardial infarction is still suspected,     repeat the test at appropriate intervals.  TROPONIN I     Status: None   Collection Time    08/10/13  3:51 AM      Result Value Range   Troponin I <0.30  <0.30 ng/mL   Comment:            Due to the release kinetics of cTnI,     a negative result within the first hours     of the onset of symptoms does not rule out     myocardial infarction with certainty.     If myocardial infarction is still suspected,     repeat the test at appropriate intervals.  BASIC METABOLIC PANEL     Status: Abnormal   Collection Time    08/10/13  5:25 AM  Result Value Range   Sodium 144  135 - 145 mEq/L   Potassium 3.5  3.5 - 5.1 mEq/L   Chloride 104  96 - 112 mEq/L   CO2 32  19 - 32 mEq/L   Glucose, Bld 89  70 - 99 mg/dL   BUN 21  6 - 23 mg/dL   Creatinine, Ser 4.09  0.50 - 1.35 mg/dL   Calcium 9.0  8.4 - 81.1 mg/dL   GFR calc non Af Amer 78 (*) >90 mL/min   GFR calc Af Amer 90 (*) >90 mL/min   Comment: (NOTE)     The eGFR has been calculated using the CKD EPI equation.     This calculation has not been validated in all clinical situations.     eGFR's persistently <90 mL/min signify possible Chronic Kidney     Disease.  TROPONIN I     Status: None   Collection Time    08/10/13  8:25 AM      Result Value Range   Troponin I <0.30  <0.30 ng/mL   Comment:            Due to the release kinetics of cTnI,     a negative result within the first hours     of the onset of symptoms does not rule out     myocardial infarction with certainty.     If myocardial infarction is still suspected,     repeat the test at appropriate intervals.   Dg Chest 2 View  08/09/2013   CLINICAL DATA:  Cough, shortness of breath.  EXAM: CHEST  2 VIEW  COMPARISON:  July 26, 2013.  FINDINGS: Stable mild cardiomegaly. Status post coronary artery bypass graft. 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 Left lung is clear. Mild right  pleural effusion is noted which is stable ; underlying pneumonia or atelectasis cannot be excluded. No pneumothorax is noted. Bony thorax is intact.  IMPRESSION: Stable mild right pleural effusion compared to prior exam.   Electronically Signed   By: Roque Lias M.D.   On: 08/09/2013 15:23    Review of Systems  Respiratory: Positive for shortness of breath.   Cardiovascular: Positive for orthopnea, leg swelling and PND. Negative for chest pain.  Neurological: Positive for dizziness.  All other systems reviewed and are negative.    Blood pressure 122/60, pulse 72, temperature 98.2 F (36.8 C), temperature source Oral, resp. rate 22, height 5\' 9"  (1.753 m), weight 199 lb 4.8 oz (90.402 kg), SpO2 98.00%. Physical Exam  Constitutional: He is oriented to person, place, and time. He appears well-developed and well-nourished. No distress.  Neck: Hepatojugular reflux and JVD present.  Cardiovascular: Normal rate, regular rhythm, normal heart sounds and intact distal pulses.  Exam reveals no gallop and no friction rub.   No murmur heard. Pulses:      Radial pulses are 2+ on the right side, and 2+ on the left side.       Dorsalis pedis pulses are 2+ on the right side, and 2+ on the left side.  Respiratory: He has no wheezes. He has no rales.  Mildly labored breathing   GI: Soft. Bowel sounds are normal. He exhibits no distension and no mass. There is no tenderness.  Musculoskeletal: He exhibits edema (2+ bilateral LEE).  Neurological: He is alert and oriented to person, place, and time.  Skin: Skin is warm and dry. He is not diaphoretic.  Psychiatric: He has a normal mood and affect. His behavior is  normal.     Assessment/Plan Principal Problem:   Acute on chronic systolic CHF (congestive heart failure) Active Problems:   OBSTRUCTIVE SLEEP APNEA   HYPERTENSION   ATRIAL FIBRILLATION, paroxysmal   COPD   Cardiac pacemaker in situ   Cardiomyopathy, ischemic   CAD (coronary artery  disease) s/p CABG   SOB (shortness of breath)  Plan: Pt admitted last PM for A/C systloic HF. He reports medical and dietary compliance. ? If exacerbation is related to recent increase in steroids. Continue w/ BID IV Lasix. I do not feel that the patient will be ready for discharge today. He still has significant SOB walking short distances and still has significant peripheral edema on exam. He will require additional IV diuretic therapy. Continue strict I/Os and daily weights. Monitor BP, renal function and electrolytes. Replete as needed. Recheck BNP in the am. 2D echo pending. Pt was seen by Dr. Royann Shivers in clinic in July of this year. He had planned to consider upgrade to CRT device if heart failure worsened. Part of the reduction in systolic function has been related to pacing induced dyssynchrony. If systolic function is further reduced, we may need to consider this. EF on last echo in 2013 was 35-40%. MD to follow with further recommendations.    Allayne Butcher, PA-C 08/10/2013, 1:38 PM

## 2013-08-11 ENCOUNTER — Encounter (HOSPITAL_COMMUNITY): Payer: 59 | Admitting: *Deleted

## 2013-08-11 ENCOUNTER — Encounter (HOSPITAL_COMMUNITY): Payer: Self-pay | Admitting: *Deleted

## 2013-08-11 ENCOUNTER — Inpatient Hospital Stay (HOSPITAL_COMMUNITY): Payer: 59 | Admitting: *Deleted

## 2013-08-11 LAB — BASIC METABOLIC PANEL
Calcium: 8.8 mg/dL (ref 8.4–10.5)
Creatinine, Ser: 0.92 mg/dL (ref 0.50–1.35)
GFR calc Af Amer: 90 mL/min — ABNORMAL LOW (ref >90)
GFR calc non Af Amer: 78 mL/min — ABNORMAL LOW (ref >90)

## 2013-08-11 MED ORDER — AMIODARONE HCL 200 MG PO TABS
200.0000 mg | ORAL_TABLET | Freq: Two times a day (BID) | ORAL | Status: DC
  Administered 2013-08-11 – 2013-08-13 (×5): 200 mg via ORAL
  Filled 2013-08-11 (×6): qty 1

## 2013-08-11 MED ORDER — SODIUM CHLORIDE 0.9 % IV SOLN
INTRAVENOUS | Status: AC | PRN
  Administered 2013-08-11: 09:00:00 via INTRAVENOUS

## 2013-08-11 NOTE — Transfer of Care (Signed)
CASE CANCELLED

## 2013-08-11 NOTE — Anesthesia Preprocedure Evaluation (Addendum)
Anesthesia Evaluation  Patient identified by MRN, date of birth, ID band Patient awake    Reviewed: Allergy & Precautions, H&P , NPO status , Patient's Chart, lab work & pertinent test results, reviewed documented beta blocker date and time   Airway Mallampati: II TM Distance: >3 FB Neck ROM: Full    Dental  (+) Teeth Intact and Dental Advisory Given   Pulmonary shortness of breath, sleep apnea , pneumonia -, COPDformer smoker,    Pulmonary exam normal       Cardiovascular hypertension, Pt. on medications and Pt. on home beta blockers + CAD, + Peripheral Vascular Disease and +CHF + dysrhythmias Atrial Fibrillation Rhythm:Irregular Rate:Normal  Echo 07/2013:Study Conclusions  - Left ventricle: The cavity size was normal. Wall thickness   was increased in a pattern of mild LVH. Systolic function   was moderately to severely reduced. The estimated ejection   fraction was in the range of 30% to 35%. Severe global   hypokinesis and inferior akinesis. Diastolic   functioncannot be adequatelydetermined due to atrial   fibrillation.The E/e' ratio is >15, suggesting markedly   elevated LV filling pressure.    Neuro/Psych    GI/Hepatic Neg liver ROS, GERD-  Medicated and Controlled,  Endo/Other  negative endocrine ROS  Renal/GU negative Renal ROS     Musculoskeletal   Abdominal (+) + obese,   Peds  Hematology negative hematology ROS (+)   Anesthesia Other Findings   Reproductive/Obstetrics                          Anesthesia Physical Anesthesia Plan  ASA: III  Anesthesia Plan: General   Post-op Pain Management:    Induction: Intravenous  Airway Management Planned: Mask  Additional Equipment:   Intra-op Plan:   Post-operative Plan:   Informed Consent: I have reviewed the patients History and Physical, chart, labs and discussed the procedure including the risks, benefits and alternatives  for the proposed anesthesia with the patient or authorized representative who has indicated his/her understanding and acceptance.   Dental advisory given  Plan Discussed with: Anesthesiologist and Surgeon  Anesthesia Plan Comments:         Anesthesia Quick Evaluation

## 2013-08-11 NOTE — Progress Notes (Signed)
Subjective: Feeling better. Breathing better. A little worried about the cardioversion.   Objective: Vital signs in last 24 hours: Temp:  [97.6 F (36.4 C)-98.2 F (36.8 C)] 98 F (36.7 C) (10/25 0500) Pulse Rate:  [60-72] 64 (10/25 0500) Resp:  [18-22] 18 (10/25 0500) BP: (114-122)/(44-80) 119/44 mmHg (10/25 0500) SpO2:  [92 %-99 %] 92 % (10/25 0500) Weight:  [197 lb 1.6 oz (89.404 kg)] 197 lb 1.6 oz (89.404 kg) (10/25 0500) Last BM Date: 08/10/13  Intake/Output from previous day: 10/24 0701 - 10/25 0700 In: 1040 [P.O.:1040] Out: 2225 [Urine:2225] Intake/Output this shift:    Medications Current Facility-Administered Medications  Medication Dose Route Frequency Provider Last Rate Last Dose  . 0.9 %  sodium chloride infusion  250 mL Intravenous PRN Haydee Monica, MD      . 0.9 %  sodium chloride infusion  250 mL Intravenous Continuous Brittainy Simmons, PA-C      . acetaminophen (TYLENOL) tablet 650 mg  650 mg Oral Q4H PRN Haydee Monica, MD   650 mg at 08/10/13 2107  . ALPRAZolam Prudy Feeler) tablet 0.5 mg  0.5 mg Oral TID PRN Haydee Monica, MD   0.5 mg at 08/10/13 2212  . aspirin chewable tablet 81 mg  81 mg Oral Daily Haydee Monica, MD   81 mg at 08/10/13 1112  . budesonide-formoterol (SYMBICORT) 160-4.5 MCG/ACT inhaler 2 puff  2 puff Inhalation BID Haydee Monica, MD   2 puff at 08/10/13 2041  . diltiazem (CARDIZEM CD) 24 hr capsule 180 mg  180 mg Oral Daily Haydee Monica, MD   180 mg at 08/10/13 1112  . docusate sodium (COLACE) capsule 200 mg  200 mg Oral Daily Haydee Monica, MD   200 mg at 08/10/13 1114  . furosemide (LASIX) injection 40 mg  40 mg Intravenous Q12H Haydee Monica, MD   40 mg at 08/10/13 2108  . hydrocortisone cream 1 % 1 application  1 application Topical TID PRN Brittainy Simmons, PA-C      . metoprolol tartrate (LOPRESSOR) tablet 25 mg  25 mg Oral BID Haydee Monica, MD   25 mg at 08/10/13 2107  . montelukast (SINGULAIR) tablet 10 mg  10 mg Oral QHS  Haydee Monica, MD   10 mg at 08/10/13 2107  . ondansetron (ZOFRAN) injection 4 mg  4 mg Intravenous Q6H PRN Haydee Monica, MD      . oxyCODONE (Oxy IR/ROXICODONE) immediate release tablet 5 mg  5 mg Oral Q4H PRN Rolan Lipa, NP   5 mg at 08/10/13 2205  . potassium chloride SA (K-DUR,KLOR-CON) CR tablet 20 mEq  20 mEq Oral Daily Haydee Monica, MD   20 mEq at 08/10/13 1116  . predniSONE (DELTASONE) tablet 5 mg  5 mg Oral Daily Haydee Monica, MD   5 mg at 08/10/13 1117  . Rivaroxaban (XARELTO) tablet 20 mg  20 mg Oral Q supper Leda Gauze, NP   20 mg at 08/10/13 1710  . sodium chloride 0.9 % injection 3 mL  3 mL Intravenous Q12H Haydee Monica, MD   3 mL at 08/10/13 1142  . sodium chloride 0.9 % injection 3 mL  3 mL Intravenous PRN Haydee Monica, MD      . sodium chloride 0.9 % injection 3 mL  3 mL Intravenous Q12H Brittainy Simmons, PA-C   3 mL at 08/10/13 2108  . sodium chloride 0.9 % injection 3  mL  3 mL Intravenous PRN Brittainy Simmons, PA-C      . tiotropium (SPIRIVA) inhalation capsule 18 mcg  18 mcg Inhalation Daily Haydee Monica, MD   18 mcg at 08/10/13 0848  . vitamin C (ASCORBIC ACID) tablet 500 mg  500 mg Oral Daily Haydee Monica, MD   500 mg at 08/10/13 1117    PE: General appearance: alert, cooperative and no distress Lungs: decreased breath sounds over right lower lung. Clear on the left Heart: irregularly irregular rhythm Extremities: 1+ bilateral LEE Pulses: 2+ and symmetric Skin: warm and dry Neurologic: Grossly normal  Lab Results:   Recent Labs  08/09/13 1850  WBC 9.3  HGB 11.5*  HCT 37.5*  PLT 166   BMET  Recent Labs  08/09/13 1504 08/10/13 0525 08/11/13 0500  NA 142 144 144  K 3.9 3.5 3.3*  CL 104 104 102  CO2 32 32 32  GLUCOSE 137* 89 83  BUN 21 21 26*  CREATININE 0.8 0.92 0.92  CALCIUM 8.5 9.0 8.8    Assessment/Plan    Principal Problem:   Acute on chronic systolic CHF (congestive heart failure) Active Problems:    OBSTRUCTIVE SLEEP APNEA   HYPERTENSION   ATRIAL FIBRILLATION, paroxysmal   COPD   Cardiac pacemaker in situ   Cardiomyopathy, ischemic   CAD (coronary artery disease) s/p CABG   SOB (shortness of breath)  Plan: Pt diuresed 1.2 L in last 24 hours. W/o supplemental O2 this am. Peripheral edema improved on exam but still with 1+ LEE. He remains in atrial fibrillation. Ventricular rate is controlled in the 80s. Plan for DCCV with Dr. Tresa Endo. Pt has been on Xarelto long term. > than 3 consecutive doses. Vitals are stable. Mild hypokalemia, w/ K+ of 3.3. Will replete. Continue IV Lasix BID for continued diuresis. Renal function is stable. MD to follow.     LOS: 2 days    Brittainy M. Delmer Islam 08/11/2013 8:52 AM   Patient seen and examined. Agree with assessment and plan. No chest pain. Breathing better.  EF 30 - 35%.  Prior to start of cardioversion I did obtain an ECG since telemetry suggest a paced beats and possible sinus rhythm.   ECG demonstrates sinus rhythm with PAC and PVC. He has atrially paced beats.  ECG from 10/23 also suggests sinus rhythm.  Will cancel cardioversion. Start amiodarone 200 mg bid and may be able to reduce cardizem with LV dysfunction since on BB and LV function is reduced. Continue diuresis.  K replete, check Mg; f/u BNP, renal fxn tomorrow.   Lennette Bihari, MD, [facility] 08/11/2013 9:24 AM

## 2013-08-11 NOTE — Progress Notes (Signed)
Patient laying in bed with no complaints at this time. Patient appears to be in no distress. Patient alert and oriented.

## 2013-08-11 NOTE — Preoperative (Signed)
Beta Blockers   Reason not to administer Beta Blockers:Not Applicable, took last PM 

## 2013-08-11 NOTE — Progress Notes (Signed)
[facility] PROGRESS NOTE Interim History: Patient is feeling better and breathing a whole of better as well. Continued to have good diuresis and his weight is down 4 pounds.  Filed Weights   08/09/13 2158 08/10/13 0606 08/11/13 0500  Weight: 90.992 kg (200 lb 9.6 oz) 90.402 kg (199 lb 4.8 oz) 89.404 kg (197 lb 1.6 oz)        Intake/Output Summary (Last 24 hours) at 08/11/13 1618 Last data filed at 08/11/13 1300  Gross per 24 hour  Intake    700 ml  Output   2150 ml  Net  -1450 ml     Assessment/Plan: 1-Acute on chronic systolic CHF (congestive heart failure): Ejection fraction 30-35%. Severe global hypokinesis and inferior akinesis. -Will continue IV Lasix and replete electrolytes as needed -Continue daily weight and strict I's and O's -Pericardial or recommendation Will start patient on amiodarone -Good oxygen saturation on room air -No JVD. -Continue beta blocker -Will discuss with cardiology addition of ACE/ARB low dose at discharge  2-paroxysmal atrial fibrillation: Currently sinus rhythm on the left leg no requirement of cardioversion.  -Appreciate assistance per cardiology group. Following recommendations patient will be started on amiodarone  -Continue Cardizem, metoprolol and xarelto  3-CAD (coronary artery disease) s/p CABG: Continue beta blocker and aspirin. Patient with 3 negative troponins and currently no chest pain.   4-Cardiac pacemaker in situ: working as intended. Cardiology following   5-COPD: Patient currently no requiring any oxygen supplementation, no wheezing, no shortness of breath.  -Continue spiriva, montelukast, chronic low dose prednisone, symbicort and PRN xopenex.  7-HYPERTENSION:stable. Continue current regimen.   DVT: Patient is on xarelto    Code Status: Full Family Communication: wife at bedside Disposition Plan: Home when medically stable 1-2 days)   Consultants:  Cardiology  Procedures: ECHO: Ejection fraction 30-35%,  severe global hypokinesis and inferior akinesis; mild mitral regurgitation; moderate tricuspid regurgitation and elevated pulmonary artery pressure.  Antibiotics:  None  HPI/Subjective: Patient is feeling better and breathing a whole lot better. Good oxygen saturation on room air. EKG has demonstrated sinus rhythm and no cardioversion was required. Patient denies chest pain, abdominal pain, nausea/vomiting and is currently afebrile  Objective: Filed Vitals:   08/11/13 0500 08/11/13 0944 08/11/13 1013 08/11/13 1300  BP: 119/44  128/59 115/47  Pulse: 64  83 67  Temp: 98 F (36.7 C)  97.8 F (36.6 C) 97.6 F (36.4 C)  TempSrc: Oral  Oral Oral  Resp: 18  18 18   Height:      Weight: 89.404 kg (197 lb 1.6 oz)     SpO2: 92% 98% 94% 98%     Exam:  General: Alert, awake, oriented x3, in no acute distress.  HEENT: No bruits, no goiter.  Heart: Regular rate, no rubs or gallops, multiple PVC's appreciated on exam Lungs: improved air movement, no wheezing, decreased BS at bases Abdomen: Soft, nontender, nondistended, positive bowel sounds.  Extremities: 1+ edema bilaterally Neuro: Grossly intact, nonfocal.   Data Reviewed: Basic Metabolic Panel:  Recent Labs Lab 08/09/13 1504 08/10/13 0525 08/11/13 0500  NA 142 144 144  K 3.9 3.5 3.3*  CL 104 104 102  CO2 32 32 32  GLUCOSE 137* 89 83  BUN 21 21 26*  CREATININE 0.8 0.92 0.92  CALCIUM 8.5 9.0 8.8   CBC:  Recent Labs Lab 08/09/13 1850  WBC 9.3  NEUTROABS 7.3  HGB 11.5*  HCT 37.5*  MCV 80.1  PLT 166   Cardiac Enzymes:  Recent Labs  Lab 08/09/13 1850 08/09/13 2350 08/10/13 0351 08/10/13 0825  TROPONINI <0.30 <0.30 <0.30 <0.30   BNP (last 3 results)  Recent Labs  07/23/13 1621 08/09/13 1504  PROBNP 1155.0* 1659.0*   Studies: No results found.  Scheduled Meds: . amiodarone  200 mg Oral BID  . aspirin  81 mg Oral Daily  . budesonide-formoterol  2 puff Inhalation BID  . diltiazem  180 mg Oral Daily   . docusate sodium  200 mg Oral Daily  . metoprolol tartrate  25 mg Oral BID  . montelukast  10 mg Oral QHS  . potassium chloride SA  20 mEq Oral Daily  . predniSONE  5 mg Oral Daily  . Rivaroxaban  20 mg Oral Q supper  . sodium chloride  3 mL Intravenous Q12H  . sodium chloride  3 mL Intravenous Q12H  . tiotropium  18 mcg Inhalation Daily  . vitamin C  500 mg Oral Daily   Continuous Infusions: . sodium chloride       Daisja Kessinger  [facility] Pager [phone]. If 8PM-8AM, please contact night-coverage at [email or web address], password [facility] 08/11/2013, 4:18 PM  LOS: 2 days

## 2013-08-11 NOTE — Anesthesia Postprocedure Evaluation (Signed)
CASE CANCELLED

## 2013-08-12 LAB — BASIC METABOLIC PANEL
BUN: 25 mg/dL — ABNORMAL HIGH (ref 6–23)
Calcium: 8.7 mg/dL (ref 8.4–10.5)
Creatinine, Ser: 0.88 mg/dL (ref 0.50–1.35)
GFR calc Af Amer: >90 mL/min (ref >90)
GFR calc non Af Amer: 79 mL/min — ABNORMAL LOW (ref >90)
Glucose, Bld: 99 mg/dL (ref 70–99)
Potassium: 3.4 mEq/L — ABNORMAL LOW (ref 3.5–5.1)

## 2013-08-12 MED ORDER — LOSARTAN POTASSIUM 25 MG PO TABS
12.5000 mg | ORAL_TABLET | Freq: Every day | ORAL | Status: DC
  Administered 2013-08-13 – 2013-08-14 (×2): 12.5 mg via ORAL
  Filled 2013-08-12 (×2): qty 0.5

## 2013-08-12 MED ORDER — POTASSIUM CHLORIDE CRYS ER 20 MEQ PO TBCR
20.0000 meq | EXTENDED_RELEASE_TABLET | Freq: Once | ORAL | Status: AC
  Administered 2013-08-12: 20 meq via ORAL
  Filled 2013-08-12: qty 1

## 2013-08-12 MED ORDER — FUROSEMIDE 10 MG/ML IJ SOLN
40.0000 mg | Freq: Two times a day (BID) | INTRAMUSCULAR | Status: DC
  Administered 2013-08-12 (×2): 40 mg via INTRAVENOUS
  Filled 2013-08-12 (×3): qty 4

## 2013-08-12 MED ORDER — LOSARTAN POTASSIUM 25 MG PO TABS: 25.0000 mg | ORAL_TABLET | Freq: Every day | ORAL | Status: DC

## 2013-08-12 NOTE — Progress Notes (Signed)
Subjective: Still feels a bit winded when he ambulates. No other complaints.   Objective: Vital signs in last 24 hours: Temp:  [97.5 F (36.4 C)-98 F (36.7 C)] 98 F (36.7 C) (10/26 0542) Pulse Rate:  [64-83] 69 (10/26 0542) Resp:  [18] 18 (10/26 0542) BP: (108-128)/(47-95) 123/70 mmHg (10/26 0542) SpO2:  [94 %-98 %] 98 % (10/26 0542) Weight:  [199 lb 12.8 oz (90.629 kg)] 199 lb 12.8 oz (90.629 kg) (10/26 0542) Last BM Date: 08/11/13  Intake/Output from previous day: 10/25 0701 - 10/26 0700 In: 480 [P.O.:480] Out: 1200 [Urine:1200] Intake/Output this shift:    Medications Current Facility-Administered Medications  Medication Dose Route Frequency Provider Last Rate Last Dose  . 0.9 %  sodium chloride infusion  250 mL Intravenous PRN Haydee Monica, MD      . 0.9 %  sodium chloride infusion  250 mL Intravenous Continuous Brittainy Simmons, PA-C      . acetaminophen (TYLENOL) tablet 650 mg  650 mg Oral Q4H PRN Haydee Monica, MD   650 mg at 08/10/13 2107  . ALPRAZolam Prudy Feeler) tablet 0.5 mg  0.5 mg Oral TID PRN Haydee Monica, MD   0.5 mg at 08/11/13 2249  . amiodarone (PACERONE) tablet 200 mg  200 mg Oral BID Lennette Bihari, MD   200 mg at 08/11/13 2243  . aspirin chewable tablet 81 mg  81 mg Oral Daily Haydee Monica, MD   81 mg at 08/11/13 1016  . budesonide-formoterol (SYMBICORT) 160-4.5 MCG/ACT inhaler 2 puff  2 puff Inhalation BID Haydee Monica, MD   2 puff at 08/11/13 2150  . diltiazem (CARDIZEM CD) 24 hr capsule 180 mg  180 mg Oral Daily Haydee Monica, MD   180 mg at 08/11/13 1015  . docusate sodium (COLACE) capsule 200 mg  200 mg Oral Daily Haydee Monica, MD   200 mg at 08/10/13 1114  . hydrocortisone cream 1 % 1 application  1 application Topical TID PRN Brittainy Simmons, PA-C      . metoprolol tartrate (LOPRESSOR) tablet 25 mg  25 mg Oral BID Haydee Monica, MD   25 mg at 08/11/13 2243  . montelukast (SINGULAIR) tablet 10 mg  10 mg Oral QHS Haydee Monica, MD    10 mg at 08/11/13 2243  . ondansetron (ZOFRAN) injection 4 mg  4 mg Intravenous Q6H PRN Haydee Monica, MD      . oxyCODONE (Oxy IR/ROXICODONE) immediate release tablet 5 mg  5 mg Oral Q4H PRN Rolan Lipa, NP   5 mg at 08/11/13 1558  . potassium chloride SA (K-DUR,KLOR-CON) CR tablet 20 mEq  20 mEq Oral Daily Haydee Monica, MD   20 mEq at 08/11/13 1016  . predniSONE (DELTASONE) tablet 5 mg  5 mg Oral Daily Haydee Monica, MD   5 mg at 08/11/13 1016  . Rivaroxaban (XARELTO) tablet 20 mg  20 mg Oral Q supper Leda Gauze, NP   20 mg at 08/11/13 1637  . sodium chloride 0.9 % injection 3 mL  3 mL Intravenous Q12H Haydee Monica, MD   3 mL at 08/11/13 2244  . sodium chloride 0.9 % injection 3 mL  3 mL Intravenous PRN Haydee Monica, MD      . sodium chloride 0.9 % injection 3 mL  3 mL Intravenous Q12H Brittainy Simmons, PA-C   3 mL at 08/11/13 2243  . sodium chloride 0.9 % injection  3 mL  3 mL Intravenous PRN Brittainy Simmons, PA-C      . tiotropium (SPIRIVA) inhalation capsule 18 mcg  18 mcg Inhalation Daily Haydee Monica, MD   18 mcg at 08/11/13 0944  . vitamin C (ASCORBIC ACID) tablet 500 mg  500 mg Oral Daily Haydee Monica, MD   500 mg at 08/11/13 1016   Facility-Administered Medications Ordered in Other Encounters  Medication Dose Route Frequency Provider Last Rate Last Dose  . 0.9 %  sodium chloride infusion    Continuous PRN Hessie Dibble, CRNA        PE: General appearance: alert, cooperative and no distress Lungs: clear to auscultation bilaterally Heart: regular rate and rhythm Extremities: 2+ bilateral LEE Pulses: 2+ and symmetric Skin: warm and dry Neurologic: Grossly normal  Lab Results:   Recent Labs  08/09/13 1850  WBC 9.3  HGB 11.5*  HCT 37.5*  PLT 166   BMET  Recent Labs  08/10/13 0525 08/11/13 0500 08/12/13 0525  NA 144 144 142  K 3.5 3.3* 3.4*  CL 104 102 103  CO2 32 32 29  GLUCOSE 89 83 99  BUN 21 26* 25*  CREATININE 0.92  0.92 0.88  CALCIUM 9.0 8.8 8.7      Assessment/Plan  Principal Problem:   Acute on chronic systolic CHF (congestive heart failure) Active Problems:   OBSTRUCTIVE SLEEP APNEA   HYPERTENSION   ATRIAL FIBRILLATION, paroxysmal   COPD   Cardiac pacemaker in situ   Cardiomyopathy, ischemic   CAD (coronary artery disease) s/p CABG   SOB (shortness of breath)  Plan: Pt apparently missed both doses of IV Lasix yesterday, which somehow got discontinued. BP and renal function both stable. Will resume 40 mg BID today. He is hypokalemic. Mg has been WNL. He is on a maintenance dose of 20 mEq of supplemental K. Will give an additional 20 today. NSR on telemetry. Will continue to follow.     LOS: 3 days    Brittainy M. Delmer Islam 08/12/2013 9:26 AM   Patient seen and examined. Agree with assessment and plan. Maintaining sinus rhythm with AV pacing and occasional ectopy, improved. K replete to > 4.0. With LV dysfunction would benefit from ARB therapy. Tolerating amiodarone.   Lennette Bihari, MD, [facility] 08/12/2013 11:27 AM

## 2013-08-12 NOTE — Progress Notes (Signed)
[facility] PROGRESS NOTE Interim History: Patient is feeling better and breathing better. No CP. Remains in sinus rhythm.  Filed Weights   08/10/13 0606 08/11/13 0500 08/12/13 0542  Weight: 90.402 kg (199 lb 4.8 oz) 89.404 kg (197 lb 1.6 oz) 90.629 kg (199 lb 12.8 oz)        Intake/Output Summary (Last 24 hours) at 08/12/13 2310 Last data filed at 08/12/13 2200  Gross per 24 hour  Intake   1070 ml  Output   2525 ml  Net  -1455 ml    Assessment/Plan: 1-Acute on chronic systolic CHF (congestive heart failure): Ejection fraction 30-35%. Severe global hypokinesis and inferior akinesis. -Will continue IV Lasix and replete electrolytes as needed -Continue daily weight and strict I's and O's -Per cardiology recommendation started patient on amiodarone, so far well tolerated -Good oxygen saturation on room air -No JVD. -Continue beta blocker -after discussing with cardiology and given reduction in EF, will start low dose ARB.  2-paroxysmal atrial fibrillation: Currently sinus rhythm on the left leg no requirement of cardioversion.  -Appreciate assistance per cardiology group. Following recommendations patient started on amiodarone, so far well tolerated. -Continue Cardizem, metoprolol and xarelto  3-CAD (coronary artery disease) s/p CABG: Continue beta blocker and aspirin. Patient with 3 negative troponins and currently no chest pain.   4-Cardiac pacemaker in situ: working as intended. Cardiology following   5-COPD: Patient currently no requiring any oxygen supplementation, no wheezing, no shortness of breath.  -Continue spiriva, montelukast, chronic low dose prednisone, symbicort and PRN xopenex.  7-HYPERTENSION:stable. Continue current regimen.   DVT: Patient is on xarelto    Code Status: Full Family Communication: wife at bedside Disposition Plan: Home when medically stable 1-2 days)   Consultants:  Cardiology  Procedures: ECHO: Ejection fraction 30-35%,  severe global hypokinesis and inferior akinesis; mild mitral regurgitation; moderate tricuspid regurgitation and elevated pulmonary artery pressure.  Antibiotics:  None  HPI/Subjective: Patient is feeling better and breathing better. Still some fluid on board. Will continue IV lasix.  Objective: Filed Vitals:   08/12/13 0542 08/12/13 1105 08/12/13 1300 08/12/13 2205  BP: 123/70 115/61 123/76 137/58  Pulse: 69 64 69 64  Temp: 98 F (36.7 C)  97.6 F (36.4 C) 97.4 F (36.3 C)  TempSrc: Oral  Oral Oral  Resp: 18 19 18 19   Height:      Weight: 90.629 kg (199 lb 12.8 oz)     SpO2: 98% 95% 99% 100%     Exam:  General: Alert, awake, oriented x3, in no acute distress.  HEENT: No bruits, no goiter.  Heart: Regular rate, no rubs or gallops, multiple PVC's appreciated on exam Lungs: improved air movement, no wheezing, decreased BS at bases Abdomen: Soft, nontender, nondistended, positive bowel sounds.  Extremities: 1+ edema bilaterally Neuro: Grossly intact, nonfocal.   Data Reviewed: Basic Metabolic Panel:  Recent Labs Lab 08/09/13 1504 08/10/13 0525 08/11/13 0500 08/12/13 0525  NA 142 144 144 142  K 3.9 3.5 3.3* 3.4*  CL 104 104 102 103  CO2 32 32 32 29  GLUCOSE 137* 89 83 99  BUN 21 21 26* 25*  CREATININE 0.8 0.92 0.92 0.88  CALCIUM 8.5 9.0 8.8 8.7  MG  --   --   --  2.0   CBC:  Recent Labs Lab 08/09/13 1850  WBC 9.3  NEUTROABS 7.3  HGB 11.5*  HCT 37.5*  MCV 80.1  PLT 166   Cardiac Enzymes:  Recent Labs Lab 08/09/13 1850 08/09/13 2350  08/10/13 0351 08/10/13 0825  TROPONINI <0.30 <0.30 <0.30 <0.30   BNP (last 3 results)  Recent Labs  07/23/13 1621 08/09/13 1504 08/12/13 0525  PROBNP 1155.0* 1659.0* 1394.0*   Studies: No results found.  Scheduled Meds: . amiodarone  200 mg Oral BID  . aspirin  81 mg Oral Daily  . budesonide-formoterol  2 puff Inhalation BID  . diltiazem  180 mg Oral Daily  . docusate sodium  200 mg Oral Daily  .  furosemide  40 mg Intravenous BID  . [START ON 08/13/2013] losartan  12.5 mg Oral Daily  . metoprolol tartrate  25 mg Oral BID  . montelukast  10 mg Oral QHS  . potassium chloride SA  20 mEq Oral Daily  . predniSONE  5 mg Oral Daily  . Rivaroxaban  20 mg Oral Q supper  . sodium chloride  3 mL Intravenous Q12H  . sodium chloride  3 mL Intravenous Q12H  . tiotropium  18 mcg Inhalation Daily  . vitamin C  500 mg Oral Daily   Continuous Infusions: . sodium chloride       Livianna Petraglia  [facility] Pager [phone]. If 8PM-8AM, please contact night-coverage at [email or web address], password [facility] 08/12/2013, 11:10 PM  LOS: 3 days

## 2013-08-12 NOTE — Progress Notes (Signed)
Patient has bleeding from left nostril.  2x2 gauze packed in nostril to stop bleeding. Patient stated that "this happens at home sometimes" since he started taking Xarelto. Will continue to monitor patient for continued bleeding.

## 2013-08-12 NOTE — Progress Notes (Signed)
Reviewed CHF educations with Pt/ Booklet given.

## 2013-08-12 NOTE — Progress Notes (Signed)
Pt O4x, PT walked hall with wife. PT states feeling much better the when coming in to the [facility]. Will continue monitor Pt.

## 2013-08-13 LAB — BASIC METABOLIC PANEL
BUN: 26 mg/dL — ABNORMAL HIGH (ref 6–23)
CO2: 33 mEq/L — ABNORMAL HIGH (ref 19–32)
Calcium: 8.8 mg/dL (ref 8.4–10.5)
Glucose, Bld: 139 mg/dL — ABNORMAL HIGH (ref 70–99)
Sodium: 141 mEq/L (ref 135–145)

## 2013-08-13 MED ORDER — FUROSEMIDE 10 MG/ML IJ SOLN
40.0000 mg | Freq: Three times a day (TID) | INTRAMUSCULAR | Status: DC
  Administered 2013-08-13 – 2013-08-14 (×4): 40 mg via INTRAVENOUS
  Filled 2013-08-13 (×4): qty 4

## 2013-08-13 MED ORDER — AMIODARONE HCL 200 MG PO TABS
200.0000 mg | ORAL_TABLET | Freq: Every day | ORAL | Status: DC
  Administered 2013-08-14: 10:00:00 200 mg via ORAL
  Filled 2013-08-13: qty 1

## 2013-08-13 MED ORDER — POTASSIUM CHLORIDE CRYS ER 20 MEQ PO TBCR
40.0000 meq | EXTENDED_RELEASE_TABLET | Freq: Once | ORAL | Status: AC
  Administered 2013-08-13: 40 meq via ORAL

## 2013-08-13 NOTE — Progress Notes (Addendum)
Subjective: Feeling better.  Objective: Vital signs in last 24 hours: Temp:  [97.4 F (36.3 C)-97.6 F (36.4 C)] 97.5 F (36.4 C) (10/27 0524) Pulse Rate:  [60-69] 60 (10/27 0524) Resp:  [18-19] 18 (10/27 0524) BP: (115-137)/(58-76) 125/65 mmHg (10/27 0524) SpO2:  [95 %-100 %] 96 % (10/27 0831) Weight:  [202 lb 12.8 oz (91.989 kg)] 202 lb 12.8 oz (91.989 kg) (10/27 0524) Last BM Date: 08/12/13  Intake/Output from previous day: 10/26 0701 - 10/27 0700 In: 1170 [P.O.:1170] Out: 2625 [Urine:2625] Intake/Output this shift: Total I/O In: 360 [P.O.:360] Out: -   Medications Current Facility-Administered Medications  Medication Dose Route Frequency Provider Last Rate Last Dose  . 0.9 %  sodium chloride infusion  250 mL Intravenous PRN Haydee Monica, MD      . 0.9 %  sodium chloride infusion  250 mL Intravenous Continuous Brittainy Simmons, PA-C      . acetaminophen (TYLENOL) tablet 650 mg  650 mg Oral Q4H PRN Haydee Monica, MD   650 mg at 08/10/13 2107  . ALPRAZolam Prudy Feeler) tablet 0.5 mg  0.5 mg Oral TID PRN Haydee Monica, MD   0.5 mg at 08/12/13 2208  . amiodarone (PACERONE) tablet 200 mg  200 mg Oral BID Lennette Bihari, MD   200 mg at 08/12/13 2208  . aspirin chewable tablet 81 mg  81 mg Oral Daily Haydee Monica, MD   81 mg at 08/12/13 1107  . budesonide-formoterol (SYMBICORT) 160-4.5 MCG/ACT inhaler 2 puff  2 puff Inhalation BID Haydee Monica, MD   2 puff at 08/13/13 0830  . diltiazem (CARDIZEM CD) 24 hr capsule 180 mg  180 mg Oral Daily Haydee Monica, MD   180 mg at 08/12/13 1109  . docusate sodium (COLACE) capsule 200 mg  200 mg Oral Daily Haydee Monica, MD   200 mg at 08/12/13 1107  . furosemide (LASIX) injection 40 mg  40 mg Intravenous TID [facility] Vassie Loll, MD      . hydrocortisone cream 1 % 1 application  1 application Topical TID PRN Brittainy Simmons, PA-C      . losartan (COZAAR) tablet 12.5 mg  12.5 mg Oral Daily Vassie Loll, MD      . metoprolol tartrate  (LOPRESSOR) tablet 25 mg  25 mg Oral BID Haydee Monica, MD   25 mg at 08/12/13 2208  . montelukast (SINGULAIR) tablet 10 mg  10 mg Oral QHS Haydee Monica, MD   10 mg at 08/12/13 2208  . ondansetron (ZOFRAN) injection 4 mg  4 mg Intravenous Q6H PRN Haydee Monica, MD      . oxyCODONE (Oxy IR/ROXICODONE) immediate release tablet 5 mg  5 mg Oral Q4H PRN Rolan Lipa, NP   5 mg at 08/12/13 1246  . potassium chloride SA (K-DUR,KLOR-CON) CR tablet 20 mEq  20 mEq Oral Daily Haydee Monica, MD   20 mEq at 08/12/13 1109  . predniSONE (DELTASONE) tablet 5 mg  5 mg Oral Daily Haydee Monica, MD   5 mg at 08/12/13 1108  . Rivaroxaban (XARELTO) tablet 20 mg  20 mg Oral Q supper Leda Gauze, NP   20 mg at 08/12/13 1722  . sodium chloride 0.9 % injection 3 mL  3 mL Intravenous Q12H Haydee Monica, MD   3 mL at 08/12/13 2210  . sodium chloride 0.9 % injection 3 mL  3 mL Intravenous PRN Haydee Monica, MD      .  sodium chloride 0.9 % injection 3 mL  3 mL Intravenous Q12H Brittainy Simmons, PA-C   3 mL at 08/12/13 2210  . sodium chloride 0.9 % injection 3 mL  3 mL Intravenous PRN Brittainy Simmons, PA-C      . tiotropium (SPIRIVA) inhalation capsule 18 mcg  18 mcg Inhalation Daily Haydee Monica, MD   18 mcg at 08/13/13 0830  . vitamin C (ASCORBIC ACID) tablet 500 mg  500 mg Oral Daily Haydee Monica, MD   500 mg at 08/12/13 1108   Facility-Administered Medications Ordered in Other Encounters  Medication Dose Route Frequency Provider Last Rate Last Dose  . 0.9 %  sodium chloride infusion    Continuous PRN Hessie Dibble, CRNA        PE: General appearance: alert, cooperative and no distress Lungs: clear to auscultation bilaterally Heart: regular rate and rhythm, S1, S2 normal, no murmur, click, rub or gallop Abdomen: +BS, Nontender, No distension. Extremities: 1+ LEE Pulses: 2+ and symmetric Skin: Warm and dry Neurologic: Grossly normal  Lab Results:  No results found for this  basename: WBC, HGB, HCT, PLT,  in the last 72 hours BMET  Recent Labs  08/11/13 0500 08/12/13 0525  NA 144 142  K 3.3* 3.4*  CL 102 103  CO2 32 29  GLUCOSE 83 99  BUN 26* 25*  CREATININE 0.92 0.88  CALCIUM 8.8 8.7    Assessment/Plan   Principal Problem:   Acute on chronic systolic CHF (congestive heart failure) Active Problems:   OBSTRUCTIVE SLEEP APNEA   HYPERTENSION   ATRIAL FIBRILLATION, paroxysmal   COPD   Cardiac pacemaker in situ   Cardiomyopathy, ischemic   CAD (coronary artery disease) s/p CABG   SOB (shortness of breath)  Plan:  Net fluids: -1.5/-3.7L.  Replace K+(Will give another now).  AV pacing with occasional A sensed beats.  Device interrogation showed AF/AT <1% with 22 episodes the longest of which was almost 10 hrs but most lasting seconds.  We can probably DC amio.  He is on Xarelto.  Ambulating well.  "Swimmy headedness" resolved.   ASA, diltiazem 180 CD. Cozaar.   Lopressor 25.  Probably ready for DC tomorrow.    LOS: 4 days    HAGER, BRYAN 08/13/2013 9:47 AM     Patient seen and examined. Agree with assessment and plan. Rhythm much more stable since initiating amiodarone dose. With low AF burden may be able to reduce dose to 200mg  but would not dc altogether yet, and titrate beta-blocker as tolerated. Agree with probable dc tomorrow.   Lennette Bihari, MD, [facility] 08/13/2013 6:18 PM

## 2013-08-13 NOTE — Progress Notes (Signed)
Patient alert and oriented. Complained of pain x1 today. Patient ambulated in the hallway with walker several times today.  Patient has no other complaints or concerns at this time.  Anastasia Fiedler RN

## 2013-08-13 NOTE — Progress Notes (Signed)
[facility] PROGRESS NOTE Interim History: Patient is feeling better and breathing better. No CP. Remains in sinus rhythm.  Filed Weights   08/11/13 0500 08/12/13 0542 08/13/13 0524  Weight: 89.404 kg (197 lb 1.6 oz) 90.629 kg (199 lb 12.8 oz) 91.989 kg (202 lb 12.8 oz)        Intake/Output Summary (Last 24 hours) at 08/13/13 1914 Last data filed at 08/13/13 1324  Gross per 24 hour  Intake   1290 ml  Output   1575 ml  Net   -285 ml    Assessment/Plan: 1-Acute on chronic systolic CHF (congestive heart failure): Ejection fraction 30-35%. Severe global hypokinesis and inferior akinesis. -Will continue IV Lasix today and replete electrolytes as needed -Continue daily weight and strict I's and O's -Per cardiology recommendation started patient on amiodarone, so far well tolerated. Plan is to reduce dose to 200 mg daily -Good oxygen saturation on room air -No JVD. -Continue beta blocker and ARB  2-paroxysmal atrial fibrillation: Currently sinus rhythm on the telemetry  - no requirement of cardioversion.  -Appreciate assistance per cardiology group. Following recommendations patient started on amiodarone, so far well tolerated. -Continue Cardizem, metoprolol and xarelto  3-CAD (coronary artery disease) s/p CABG: Continue beta blocker and aspirin. Patient with 3 negative troponins and currently no chest pain.   4-Cardiac pacemaker in situ: working as intended. Cardiology following   5-COPD: Patient currently no requiring any oxygen supplementation, no wheezing, no shortness of breath.  -Continue spiriva, montelukast, chronic low dose prednisone, symbicort and PRN xopenex.  7-HYPERTENSION:stable. Continue current regimen.   DVT: Patient is on xarelto    Code Status: Full Family Communication: wife at bedside Disposition Plan: Home when medically (most likely tomorrow)   Consultants:  Cardiology  Procedures: ECHO: Ejection fraction 30-35%, severe global hypokinesis  and inferior akinesis; mild mitral regurgitation; moderate tricuspid regurgitation and elevated pulmonary artery pressure.  Antibiotics:  None  HPI/Subjective: Patient is feeling better and breathing better. Still some fluid on board. Will continue IV lasix one more day.  Objective: Filed Vitals:   08/13/13 0524 08/13/13 0831 08/13/13 1044 08/13/13 1324  BP: 125/65  105/59 104/61  Pulse: 60  66 69  Temp: 97.5 F (36.4 C)     TempSrc:      Resp: 18  20 18   Height:      Weight: 91.989 kg (202 lb 12.8 oz)     SpO2: 100% 96% 97% 98%     Exam:  General: Alert, awake, oriented x3, in no acute distress.  HEENT: No bruits, no goiter.  Heart: Regular rate, no rubs or gallops, multiple PVC's appreciated on exam Lungs: improved air movement, no wheezing, decreased BS at bases Abdomen: Soft, nontender, nondistended, positive bowel sounds.  Extremities: 1+ edema bilaterally Neuro: Grossly intact, nonfocal.   Data Reviewed: Basic Metabolic Panel:  Recent Labs Lab 08/09/13 1504 08/10/13 0525 08/11/13 0500 08/12/13 0525 08/13/13 0929  NA 142 144 144 142 141  K 3.9 3.5 3.3* 3.4* 3.6  CL 104 104 102 103 100  CO2 32 32 32 29 33*  GLUCOSE 137* 89 83 99 139*  BUN 21 21 26* 25* 26*  CREATININE 0.8 0.92 0.92 0.88 0.89  CALCIUM 8.5 9.0 8.8 8.7 8.8  MG  --   --   --  2.0  --    CBC:  Recent Labs Lab 08/09/13 1850  WBC 9.3  NEUTROABS 7.3  HGB 11.5*  HCT 37.5*  MCV 80.1  PLT 166  Cardiac Enzymes:  Recent Labs Lab 08/09/13 1850 08/09/13 2350 08/10/13 0351 08/10/13 0825  TROPONINI <0.30 <0.30 <0.30 <0.30   BNP (last 3 results)  Recent Labs  07/23/13 1621 08/09/13 1504 08/12/13 0525  PROBNP 1155.0* 1659.0* 1394.0*   Studies: No results found.  Scheduled Meds: . [START ON 08/14/2013] amiodarone  200 mg Oral Daily  . aspirin  81 mg Oral Daily  . budesonide-formoterol  2 puff Inhalation BID  . diltiazem  180 mg Oral Daily  . docusate sodium  200 mg Oral  Daily  . furosemide  40 mg Intravenous TID AC  . losartan  12.5 mg Oral Daily  . metoprolol tartrate  25 mg Oral BID  . montelukast  10 mg Oral QHS  . potassium chloride SA  20 mEq Oral Daily  . predniSONE  5 mg Oral Daily  . Rivaroxaban  20 mg Oral Q supper  . sodium chloride  3 mL Intravenous Q12H  . sodium chloride  3 mL Intravenous Q12H  . tiotropium  18 mcg Inhalation Daily  . vitamin C  500 mg Oral Daily   Continuous Infusions: . sodium chloride       Khiley Lieser  [facility] Pager [phone]. If 8PM-8AM, please contact night-coverage at [email or web address], password [facility] 08/13/2013, 7:14 PM  LOS: 4 days

## 2013-08-14 ENCOUNTER — Telehealth: Payer: Self-pay | Admitting: *Deleted

## 2013-08-14 DIAGNOSIS — Z7901 Long term (current) use of anticoagulants: Secondary | ICD-10-CM

## 2013-08-14 DIAGNOSIS — F411 Generalized anxiety disorder: Secondary | ICD-10-CM

## 2013-08-14 LAB — BASIC METABOLIC PANEL
BUN: 26 mg/dL — ABNORMAL HIGH (ref 6–23)
CO2: 30 mEq/L (ref 19–32)
GFR calc non Af Amer: 78 mL/min — ABNORMAL LOW (ref >90)
Glucose, Bld: 86 mg/dL (ref 70–99)
Potassium: 3.9 mEq/L (ref 3.5–5.1)
Sodium: 139 mEq/L (ref 135–145)

## 2013-08-14 MED ORDER — LOSARTAN POTASSIUM 25 MG PO TABS: 25.0000 mg | ORAL_TABLET | Freq: Every day | ORAL | Status: DC

## 2013-08-14 MED ORDER — AMIODARONE HCL 200 MG PO TABS: 200.0000 mg | ORAL_TABLET | Freq: Every day | ORAL | Status: DC

## 2013-08-14 MED ORDER — FUROSEMIDE 40 MG PO TABS: 40.0000 mg | ORAL_TABLET | Freq: Every day | ORAL | Status: DC

## 2013-08-14 MED ORDER — MUCINEX DM MAXIMUM STRENGTH 60-1200 MG PO TB12: 1.0000 | ORAL_TABLET | Freq: Two times a day (BID) | ORAL | Status: DC | PRN

## 2013-08-14 NOTE — Progress Notes (Signed)
DAILY PROGRESS NOTE  Subjective:  No events overnight. Wants to go home.  I could not find the interrogation that was performed to determine his burden of atrial fibrillation. It is questionable whether he has had frequent PAC's or a-fib here, but not felt to be in a-fib on Saturday, therefore not cardioverted. He was placed on amiodarone. Appears to be adequately diuresed.  Objective:  Temp:  [97.4 F (36.3 C)-97.6 F (36.4 C)] 97.4 F (36.3 C) (10/28 0531) Pulse Rate:  [66-72] 70 (10/28 0948) Resp:  [18-20] 18 (10/28 0531) BP: (104-130)/(46-87) 105/46 mmHg (10/28 0948) SpO2:  [96 %-100 %] 97 % (10/28 1016) FiO2 (%):  [21 %] 21 % (10/27 2102) Weight:  [199 lb 11.8 oz (90.6 kg)] 199 lb 11.8 oz (90.6 kg) (10/28 0531) Weight change: -3 lb 1 oz (-1.389 kg)  Intake/Output from previous day: 10/27 0701 - 10/28 0700 In: 1620 [P.O.:1620] Out: 2300 [Urine:2300]  Intake/Output from this shift: Total I/O In: 240 [P.O.:240] Out: 1050 [Urine:1050]  Medications: Current Facility-Administered Medications  Medication Dose Route Frequency Provider Last Rate Last Dose  . 0.9 %  sodium chloride infusion  250 mL Intravenous PRN Haydee Monica, MD      . 0.9 %  sodium chloride infusion  250 mL Intravenous Continuous Brittainy Simmons, PA-C      . acetaminophen (TYLENOL) tablet 650 mg  650 mg Oral Q4H PRN Haydee Monica, MD   650 mg at 08/14/13 0347  . ALPRAZolam Prudy Feeler) tablet 0.5 mg  0.5 mg Oral TID PRN Haydee Monica, MD   0.5 mg at 08/13/13 2159  . amiodarone (PACERONE) tablet 200 mg  200 mg Oral Daily Lennette Bihari, MD   200 mg at 08/14/13 0948  . aspirin chewable tablet 81 mg  81 mg Oral Daily Haydee Monica, MD   81 mg at 08/14/13 0951  . budesonide-formoterol (SYMBICORT) 160-4.5 MCG/ACT inhaler 2 puff  2 puff Inhalation BID Haydee Monica, MD   2 puff at 08/14/13 1014  . diltiazem (CARDIZEM CD) 24 hr capsule 180 mg  180 mg Oral Daily Haydee Monica, MD   180 mg at 08/14/13 0951  .  docusate sodium (COLACE) capsule 200 mg  200 mg Oral Daily Haydee Monica, MD   200 mg at 08/14/13 0948  . furosemide (LASIX) injection 40 mg  40 mg Intravenous TID [facility] Vassie Loll, MD   40 mg at 08/14/13 1610  . hydrocortisone cream 1 % 1 application  1 application Topical TID PRN Brittainy Simmons, PA-C      . losartan (COZAAR) tablet 12.5 mg  12.5 mg Oral Daily Vassie Loll, MD   12.5 mg at 08/14/13 9604  . metoprolol tartrate (LOPRESSOR) tablet 25 mg  25 mg Oral BID Haydee Monica, MD   25 mg at 08/13/13 2159  . montelukast (SINGULAIR) tablet 10 mg  10 mg Oral QHS Haydee Monica, MD   10 mg at 08/13/13 2159  . ondansetron (ZOFRAN) injection 4 mg  4 mg Intravenous Q6H PRN Haydee Monica, MD      . oxyCODONE (Oxy IR/ROXICODONE) immediate release tablet 5 mg  5 mg Oral Q4H PRN Rolan Lipa, NP   5 mg at 08/14/13 0039  . potassium chloride SA (K-DUR,KLOR-CON) CR tablet 20 mEq  20 mEq Oral Daily Haydee Monica, MD   20 mEq at 08/14/13 0952  . predniSONE (DELTASONE) tablet 5 mg  5 mg Oral Daily Rachal  Cliffton Asters, MD   5 mg at 08/14/13 9629  . Rivaroxaban (XARELTO) tablet 20 mg  20 mg Oral Q supper Leda Gauze, NP   20 mg at 08/13/13 1825  . sodium chloride 0.9 % injection 3 mL  3 mL Intravenous Q12H Haydee Monica, MD   3 mL at 08/14/13 0953  . sodium chloride 0.9 % injection 3 mL  3 mL Intravenous PRN Haydee Monica, MD      . sodium chloride 0.9 % injection 3 mL  3 mL Intravenous Q12H Brittainy Simmons, PA-C   3 mL at 08/13/13 2159  . sodium chloride 0.9 % injection 3 mL  3 mL Intravenous PRN Brittainy Simmons, PA-C      . tiotropium (SPIRIVA) inhalation capsule 18 mcg  18 mcg Inhalation Daily Haydee Monica, MD   18 mcg at 08/14/13 1014  . vitamin C (ASCORBIC ACID) tablet 500 mg  500 mg Oral Daily Haydee Monica, MD   500 mg at 08/14/13 5284   Facility-Administered Medications Ordered in Other Encounters  Medication Dose Route Frequency Provider Last Rate Last Dose  . 0.9 %   sodium chloride infusion    Continuous PRN Hessie Dibble, CRNA        Physical Exam: General appearance: alert and no distress Lungs: clear to auscultation bilaterally Heart: regular rate and rhythm, S1, S2 normal, no murmur, click, rub or gallop Extremities: edema trace pedal  Lab Results: Results for orders placed during the hospital encounter of 08/09/13 (from the past 48 hour(s))  BASIC METABOLIC PANEL     Status: Abnormal   Collection Time    08/13/13  9:29 AM      Result Value Range   Sodium 141  135 - 145 mEq/L   Potassium 3.6  3.5 - 5.1 mEq/L   Chloride 100  96 - 112 mEq/L   CO2 33 (*) 19 - 32 mEq/L   Glucose, Bld 139 (*) 70 - 99 mg/dL   BUN 26 (*) 6 - 23 mg/dL   Creatinine, Ser 1.32  0.50 - 1.35 mg/dL   Calcium 8.8  8.4 - 44.0 mg/dL   GFR calc non Af Amer 79 (*) >90 mL/min   GFR calc Af Amer >90  >90 mL/min   Comment: (NOTE)     The eGFR has been calculated using the CKD EPI equation.     This calculation has not been validated in all clinical situations.     eGFR's persistently <90 mL/min signify possible Chronic Kidney     Disease.  BASIC METABOLIC PANEL     Status: Abnormal   Collection Time    08/14/13  6:00 AM      Result Value Range   Sodium 139  135 - 145 mEq/L   Potassium 3.9  3.5 - 5.1 mEq/L   Chloride 99  96 - 112 mEq/L   CO2 30  19 - 32 mEq/L   Glucose, Bld 86  70 - 99 mg/dL   BUN 26 (*) 6 - 23 mg/dL   Creatinine, Ser 1.02  0.50 - 1.35 mg/dL   Calcium 8.6  8.4 - 72.5 mg/dL   GFR calc non Af Amer 78 (*) >90 mL/min   GFR calc Af Amer >90  >90 mL/min   Comment: (NOTE)     The eGFR has been calculated using the CKD EPI equation.     This calculation has not been validated in all clinical situations.  eGFR's persistently <90 mL/min signify possible Chronic Kidney     Disease.    Imaging: No results found.  Assessment:  1. Principal Problem: 2.   Acute on chronic systolic CHF (congestive heart failure) 3. Active Problems: 4.    HYPERCHOLESTEROLEMIA 5.   OBSTRUCTIVE SLEEP APNEA 6.   HYPERTENSION 7.   PAF- Amiodarone added 10/14 8.   PVD - CEA '02, RCFA PTA '01 9.   COPD 10.   Cardiac pacemaker in situ- St Jude for CHB 7/09- pacer dependednt 11.   Cardiomyopathy, ischemic- EF 30-35% this admission 12.   CAD s/p CABG '05, patent grafts 4/09, low risk Nuc 2011 13.   SOB (shortness of breath) 14.   Chronic anticoagulation 15.   Plan:  1. Ok for discharge today from our standpoint on lasix 40 mg daily. Dry weight is ~198 lbs. Will keep on amiodarone, however, will need re-interrogation of his device in the office and may consider discontinuing this if his a-fib burden is low.  EF, as demonstrated, is reduced more to 30-35% and as previously mentioned, he would be a good candidate for CRT +/-D upgrade. Follow-up with Dr. Salena Saner or MLP (TCM 7) in 5-8 days (complex heart failure patient, high risk for re-admission).  Time Spent Directly with Patient:  15 minutes  Length of Stay:  LOS: 5 days   Chrystie Nose, MD, [facility] Attending Cardiologist [facility]  Ivis Henneman C 08/14/2013, 10:35 AM

## 2013-08-14 NOTE — Telephone Encounter (Signed)
Received PA for symbicort from [employer]. This was approved from 07/15/13-08/13/16. Case #: [id number].

## 2013-08-14 NOTE — Progress Notes (Signed)
Subjective:  SOB improved  Objective:  Vital Signs in the last 24 hours: Temp:  [97.4 F (36.3 C)-97.6 F (36.4 C)] 97.4 F (36.3 C) (10/28 0531) Pulse Rate:  [66-72] 70 (10/28 0948) Resp:  [18-20] 18 (10/28 0531) BP: (104-130)/(46-87) 105/46 mmHg (10/28 0948) SpO2:  [96 %-100 %] 97 % (10/28 1016) FiO2 (%):  [21 %] 21 % (10/27 2102) Weight:  [199 lb 11.8 oz (90.6 kg)] 199 lb 11.8 oz (90.6 kg) (10/28 0531)  Intake/Output from previous day:  Intake/Output Summary (Last 24 hours) at 08/14/13 1035 Last data filed at 08/14/13 0900  Gross per 24 hour  Intake   1500 ml  Output   3100 ml  Net  -1600 ml    Physical Exam: General appearance: alert, cooperative and no distress Lungs: decreased breath sounds Heart: regular rate and rhythm   Rate: 70  Rhythm: paced  Lab Results: No results found for this basename: WBC, HGB, PLT,  in the last 72 hours  Recent Labs  08/13/13 0929 08/14/13 0600  NA 141 139  K 3.6 3.9  CL 100 99  CO2 33* 30  GLUCOSE 139* 86  BUN 26* 26*  CREATININE 0.89 0.91   No results found for this basename: TROPONINI, CK, MB,  in the last 72 hours No results found for this basename: INR,  in the last 72 hours  Imaging: Imaging results have been reviewed  Cardiac Studies:  Assessment/Plan:   Principal Problem:   Acute on chronic systolic CHF (congestive heart failure) Active Problems:   PAF- Amiodarone added 10/14   COPD   Cardiomyopathy, ischemic- EF 30-35% this admission   CAD s/p CABG '05, patent grafts 4/09, low risk Nuc 2011   HYPERCHOLESTEROLEMIA   OBSTRUCTIVE SLEEP APNEA   HYPERTENSION   PVD - CEA '02, RCFA PTA '01   Cardiac pacemaker in situ- St Jude for CHB 7/09- pacer dependednt   Chronic anticoagulation   SOB (shortness of breath)    PLAN: Dr Rennis Golden to see and recommend home Lasix dose. We will arrange early follow up. New Dry wgt appears to be 199.   Corine Shelter PA-C [facility] [phone] 08/14/2013, 10:35 AM

## 2013-08-14 NOTE — Discharge Summary (Signed)
Physician Discharge Summary  Todd Kelly ZHY:[id number] DOB: 07-04-1931 DOA: 08/09/2013  PCP: Michele Mcalpine, MD  Admit date: 08/09/2013 Discharge date: 08/14/2013  Time spent: >30 minutes  Recommendations for Outpatient Follow-up:  1.  BMET to follow electrolytes and renal function 2.  Keep on amiodarone, however, will need re-interrogation of his device in the office and may consider discontinuing this if his a-fib burden is low. EF, as demonstrated, is reduced more to 30-35% and as previously mentioned, he would be a good candidate for CRT +/-D upgrade. 3.  Reassess BP and adjust medications as needed   BNP    Component Value Date/Time   PROBNP 1394.0* 08/12/2013 0525   Filed Weights   08/12/13 0542 08/13/13 0524 08/14/13 0531  Weight: 90.629 kg (199 lb 12.8 oz) 91.989 kg (202 lb 12.8 oz) 90.6 kg (199 lb 11.8 oz)    Discharge Diagnoses:  Principal Problem:   Acute on chronic systolic CHF (congestive heart failure) Active Problems:   HYPERCHOLESTEROLEMIA   OBSTRUCTIVE SLEEP APNEA   HYPERTENSION   PAF- Amiodarone added 10/14   PVD - CEA '02, RCFA PTA '01   COPD   Cardiac pacemaker in situ- St Jude for CHB 7/09- pacer dependednt   Cardiomyopathy, ischemic- EF 30-35% this admission   CAD s/p CABG '05, patent grafts 4/09, low risk Nuc 2011   SOB (shortness of breath)   Chronic anticoagulation   Discharge Condition: stable and improved. No CP, no SOB. Ideal dry weight is 198 pounds (at discharge weight 199). Patient will follow discharge instructions and will be on lasix 40mg  daily. Follow up appointment with Dr. Royann Shivers in 5 days.  Diet recommendation: heart healthy low sodium diet  History of present illness:  79 yo male with h/o afib, copd, chf, cabg, pacer, comes in with worsening sob and le edema for the last several days. Pt was recenlty dx with cap and completed 10 days of avelox. He had improved with steroids and avelox, cxr showed rt effusion with possible  consolidation. Pt denies any fevers. Then over the last couple of days he has had worsening sob esp with exertion. No cp. With orthopnea and pnd for 2 nights now which is new. No n/v. Has had a cough nonproductive. Since arrival to ED he has had lasix iv and has had good diuresis and feels much better. Does not require oxygen at home. No pain.  Hospital Course:  1-Acute on chronic systolic CHF (congestive heart failure): Ejection fraction 30-35%. Severe global hypokinesis and inferior akinesis.  -Will discharge on daily lasix 40mg  -patient b-blocker and losartan -Continue daily weight and low sodium diet -Per cardiology recommendation started patient on amiodarone, so far well tolerated. Plan is to reduce dose to 200 mg daily and follow as an outpatient for long term decision -Good oxygen saturation on room air  -No JVD, no crackles -BNP trending down.  2-paroxysmal atrial fibrillation: Currently sinus rhythm on the telemetry  - no requirement of cardioversion.  -Appreciate assistance per cardiology group. Following recommendations patient started on amiodarone, so far well tolerated.  -Continue Cardizem, metoprolol and xarelto   3-CAD (coronary artery disease) s/p CABG: Continue beta blocker and aspirin.  -Patient with 3 negative troponins and currently no chest pain.   4-Cardiac pacemaker in situ: working as intended. Cardiology following   5-COPD: Patient currently no requiring any oxygen supplementation, no wheezing, no shortness of breath.  -Continue spiriva, montelukast, chronic low dose prednisone, symbicort and PRN xopenex.  -patient advise to use  PRN mucinex for cough and congestion  7-HYPERTENSION:stable. Continue current regimen.   Procedures: ECHO: Ejection fraction 30-35%, severe global hypokinesis and inferior akinesis; mild mitral regurgitation; moderate tricuspid regurgitation and elevated pulmonary artery pressure.  Consultations:  cardiology  Discharge  Exam: Filed Vitals:   08/14/13 0948  BP: 105/46  Pulse: 70  Temp:   Resp:    General: Alert, awake, oriented x3, in no acute distress.  HEENT: No bruits, no goiter.  Heart: Regular rate, no rubs or gallops, no JVD Lungs: improved air movement, no wheezing, no crackles Abdomen: Soft, nontender, nondistended, positive bowel sounds.  Extremities: 1+ edema bilaterally  Neuro: Grossly intact, nonfocal.   Discharge Instructions  Discharge Orders   Future Appointments Provider Department Dept Phone   08/16/2013 8:00 AM [address] [place] [facility] [phone]   08/22/2013 3:20 PM Brittainy Delmer Islam [facility] [phone]   11/05/2013 9:00 AM Michele Mcalpine, MD [facility] [phone]   Future Orders Complete By Expires   Diet - low sodium heart healthy  As directed    Discharge instructions  As directed    Comments:     Take medications as prescribed Follow up with cardiology as instructed Follow low sodium diet (less 2.5 grams daily) Arrange follow up with PCP in 2 weeks       Medication List         ALPRAZolam 0.5 MG tablet  Commonly known as:  XANAX  Take 1 tablet (0.5 mg total) by mouth 3 (three) times daily as needed for anxiety. Not to exceed 3 per day.     amiodarone 200 MG tablet  Commonly known as:  PACERONE  Take 1 tablet (200 mg total) by mouth daily.     aspirin 81 MG tablet  Take 81 mg by mouth daily.     budesonide-formoterol 160-4.5 MCG/ACT inhaler  Commonly known as:  SYMBICORT  Inhale 2 puffs into the lungs 2 (two) times daily.     COLACE 100 MG capsule  Generic drug:  docusate sodium  Take 200 mg by mouth daily.     CoQ10 100 MG Caps  Take 100 mg by mouth daily.     diltiazem 180 MG 24 hr capsule  Commonly known as:  CARDIZEM CD  Take 180 mg by mouth daily.     Fish Oil 1000 MG Caps  Take 1 capsule by mouth daily.     fluticasone 50 MCG/ACT nasal spray  Commonly known as:  FLONASE  Place 1  spray into the nose 2 (two) times daily.     furosemide 40 MG tablet  Commonly known as:  LASIX  Take 1 tablet (40 mg total) by mouth daily.     HYDROcodone-acetaminophen 5-325 MG per tablet  Commonly known as:  NORCO/VICODIN  Take 1/2 to 1 tablet by mouth three times daily as needed for pain     losartan 25 MG tablet  Commonly known as:  COZAAR  Take 1 tablet (25 mg total) by mouth at bedtime.     meclizine 25 MG tablet  Commonly known as:  ANTIVERT  Take 1/2 to 1 tablet by mouth every 6 hours as needed for dizziness     metoprolol tartrate 25 MG tablet  Commonly known as:  LOPRESSOR  Take 25 mg by mouth 2 (two) times daily.     montelukast 10 MG tablet  Commonly known as:  SINGULAIR  Take 1 tablet (10 mg total) by mouth at bedtime.  MUCINEX DM MAXIMUM STRENGTH 60-1200 MG Tb12  Take 1 tablet by mouth every 12 (twelve) hours as needed (cough and congestion).     polyethylene glycol packet  Commonly known as:  MIRALAX / GLYCOLAX  Take 17 g by mouth daily as needed.     potassium chloride SA 20 MEQ tablet  Commonly known as:  K-DUR,KLOR-CON  Take 20 mEq by mouth daily.     predniSONE 5 MG tablet  Commonly known as:  DELTASONE  Take 5 mg by mouth daily.     ranitidine 150 MG capsule  Commonly known as:  ZANTAC  Take 150 mg by mouth 2 (two) times daily.     simvastatin 80 MG tablet  Commonly known as:  ZOCOR  Take 80 mg by mouth at bedtime.     tiotropium 18 MCG inhalation capsule  Commonly known as:  SPIRIVA  Place 1 capsule (18 mcg total) into inhaler and inhale daily.     vitamin C 500 MG tablet  Commonly known as:  ASCORBIC ACID  Take 500 mg by mouth daily.     XARELTO 20 MG Tabs tablet  Generic drug:  Rivaroxaban  Take 20 mg by mouth daily with supper.       Allergies  Allergen Reactions  . Penicillins Anaphylaxis and Hives    REACTION: Allergic to PCN w/ throat swelling \\T \ hives       Follow-up Information   Follow up with Robbie Lis, PA-C On 08/22/2013. (3:20)    Specialty:  Cardiology   Contact information:   [address] [place] [address] [phone]       The results of significant diagnostics from this hospitalization (including imaging, microbiology, ancillary and laboratory) are listed below for reference.    Significant Diagnostic Studies: Dg Chest 2 View  08/09/2013   CLINICAL DATA:  Cough, shortness of breath.  EXAM: CHEST  2 VIEW  COMPARISON:  July 26, 2013.  FINDINGS: Stable mild cardiomegaly. Status post coronary artery bypass graft. 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 Left lung is clear. Mild right pleural effusion is noted which is stable ; underlying pneumonia or atelectasis cannot be excluded. No pneumothorax is noted. Bony thorax is intact.  IMPRESSION: Stable mild right pleural effusion compared to prior exam.   Electronically Signed   By: Roque Lias M.D.   On: 08/09/2013 15:23   Dg Chest 2 View  07/26/2013   CLINICAL DATA:  All followup pneumonia with cough and shortness of breath  EXAM: CHEST  2 VIEW  COMPARISON:  07/23/2013  FINDINGS: Mild cardiomegaly stable. Cardiac pacer unchanged. There is a small right pleural effusion with underlying consolidation at the right lung base. The appearance of this is unchanged.  IMPRESSION: No change in appearance of small right pleural effusion with underlying consolidation.   Electronically Signed   By: Esperanza Heir M.D.   On: 07/26/2013 10:22   Dg Chest 2 View  07/23/2013   CLINICAL DATA:  Cough and shortness of breath  EXAM: CHEST  2 VIEW  COMPARISON:  10/23/2012  FINDINGS: Left-sided dual lead pacer in place. Evidence of CABG. Moderate enlargement of the cardiac silhouette is noted with central vascular congestion. New small right pleural effusion and trace left pleural fluid noted with patchy bibasilar airspace opacities. No acute osseous abnormality.  IMPRESSION: Small right and trace left pleural effusions with obscuration  of the lung bases. Underlying pneumonia could be obscured.   Electronically Signed   By: Christiana Pellant  M.D.   On: 07/23/2013 15:40   Labs: Basic Metabolic Panel:  Recent Labs Lab 08/10/13 0525 08/11/13 0500 08/12/13 0525 08/13/13 0929 08/14/13 0600  NA 144 144 142 141 139  K 3.5 3.3* 3.4* 3.6 3.9  CL 104 102 103 100 99  CO2 32 32 29 33* 30  GLUCOSE 89 83 99 139* 86  BUN 21 26* 25* 26* 26*  CREATININE 0.92 0.92 0.88 0.89 0.91  CALCIUM 9.0 8.8 8.7 8.8 8.6  MG  --   --  2.0  --   --    CBC:  Recent Labs Lab 08/09/13 1850  WBC 9.3  NEUTROABS 7.3  HGB 11.5*  HCT 37.5*  MCV 80.1  PLT 166   Cardiac Enzymes:  Recent Labs Lab 08/09/13 1850 08/09/13 2350 08/10/13 0351 08/10/13 0825  TROPONINI <0.30 <0.30 <0.30 <0.30   BNP: BNP (last 3 results)  Recent Labs  07/23/13 1621 08/09/13 1504 08/12/13 0525  PROBNP 1155.0* 1659.0* 1394.0*    Signed:  Sonika Levins  [facility] 08/14/2013, 1:51 PM

## 2013-08-14 NOTE — Progress Notes (Signed)
Pt. Seen and examined.  See my note.  Chrystie Nose, MD, [facility] Attending Cardiologist [facility]

## 2013-08-16 ENCOUNTER — Ambulatory Visit (INDEPENDENT_AMBULATORY_CARE_PROVIDER_SITE_OTHER): Payer: 59 | Admitting: Cardiology

## 2013-08-16 ENCOUNTER — Encounter: Payer: Self-pay | Admitting: Cardiology

## 2013-08-16 VITALS — BP 112/60 | HR 72 | Ht 69.0 in | Wt 198.0 lb

## 2013-08-16 DIAGNOSIS — I4891 Unspecified atrial fibrillation: Secondary | ICD-10-CM

## 2013-08-16 DIAGNOSIS — J449 Chronic obstructive pulmonary disease, unspecified: Secondary | ICD-10-CM

## 2013-08-16 DIAGNOSIS — I2510 Atherosclerotic heart disease of native coronary artery without angina pectoris: Secondary | ICD-10-CM

## 2013-08-16 DIAGNOSIS — Z7901 Long term (current) use of anticoagulants: Secondary | ICD-10-CM

## 2013-08-16 DIAGNOSIS — I509 Heart failure, unspecified: Secondary | ICD-10-CM

## 2013-08-16 DIAGNOSIS — Z950 Presence of cardiac pacemaker: Secondary | ICD-10-CM

## 2013-08-16 DIAGNOSIS — I5023 Acute on chronic systolic (congestive) heart failure: Secondary | ICD-10-CM

## 2013-08-16 DIAGNOSIS — I255 Ischemic cardiomyopathy: Secondary | ICD-10-CM

## 2013-08-16 LAB — BASIC METABOLIC PANEL
BUN: 21 mg/dL (ref 6–23)
CO2: 32 mEq/L (ref 19–32)
Calcium: 8.6 mg/dL (ref 8.4–10.5)
Chloride: 100 mEq/L (ref 96–112)
Creat: 0.87 mg/dL (ref 0.50–1.35)
Glucose, Bld: 106 mg/dL — ABNORMAL HIGH (ref 70–99)
Potassium: 4.1 mEq/L (ref 3.5–5.3)
Sodium: 138 mEq/L (ref 135–145)

## 2013-08-16 MED ORDER — GUAIFENESIN ER 600 MG PO TB12: 1200.0000 mg | ORAL_TABLET | Freq: Two times a day (BID) | ORAL | Status: DC | PRN

## 2013-08-16 NOTE — Assessment & Plan Note (Signed)
On chronic low dose steroids.

## 2013-08-16 NOTE — Assessment & Plan Note (Addendum)
He may still be in AF with CVR by EKG

## 2013-08-16 NOTE — Patient Instructions (Signed)
Your physician recommends that you schedule a follow-up appointment in: 3 weeks with Dr Amanda Cockayne. Labs today. Stop Mucinex DM- OK to take Mucinex 600 mg  Twice a day as needed for cough.

## 2013-08-16 NOTE — Progress Notes (Signed)
08/16/2013 Todd Kelly   03-10-1931  [id number]  Primary Physicia NADEL,SCOTT M, MD Primary Cardiologist: Dr Royann Shivers  HPI:  79 y/o male followed by Dr Royann Shivers with a history of CAD- (CABG '05, cath '09, low risk Myoview 2011), COPD on chronic low dose steroids, CHB s/p St Jude pacemaker 7/09 (pacer dependent), and known ICM. He was recently admitted 08/09/13 with CHF after 3 weeks of OP ABs and increased steroids for CAP. He has a known CM with an EF of 35-45% by echo in Jan 2013. Repeat echo 08/10/13 revealed an EF of 30-35%. He was also noted to be in AF. Amiodarone was added and he converted to NSR the day he was to have a DCCV. His discharge weight was 199. He had recently lost 40 lbs on a diet and we feel his new "dry weight" has been established between 195-199. The plan was to continue Amiodarone for a month and reassess his rhythm and AF burden and consider stopping Amiodarone if appropriate. He'll also need to see Dr Royann Shivers about possible upgrade to a BiV ICD, although we may want to re check his EF if he holds NSR before we go ahead with that. He was scheduled to be a "TCM phone call" today but was called and told to come in for an office visit. He has done well since discharge. His wife says his wgt at home was 192, (198) here. He has no edema, he still has some cough but no increased SOB.  Current Outpatient Prescriptions  Medication Sig Dispense Refill  . ALPRAZolam (XANAX) 0.5 MG tablet Take 1 tablet (0.5 mg total) by mouth 3 (three) times daily as needed for anxiety. Not to exceed 3 per day.  270 tablet  1  . amiodarone (PACERONE) 200 MG tablet Take 1 tablet (200 mg total) by mouth daily.  30 tablet  0  . aspirin 81 MG tablet Take 81 mg by mouth daily.        . budesonide-formoterol (SYMBICORT) 160-4.5 MCG/ACT inhaler Inhale 2 puffs into the lungs 2 (two) times daily.  3 Inhaler  3  . Coenzyme Q10 (COQ10) 100 MG CAPS Take 100 mg by mouth daily.       [name] diltiazem (CARDIZEM CD)  180 MG 24 hr capsule Take 180 mg by mouth daily.      [name] docusate sodium (COLACE) 100 MG capsule Take 200 mg by mouth daily.       . fluticasone (FLONASE) 50 MCG/ACT nasal spray Place 1 spray into the nose 2 (two) times daily.  48 g  3  . furosemide (LASIX) 40 MG tablet Take 1 tablet (40 mg total) by mouth daily.  30 tablet  1  . HYDROcodone-acetaminophen (NORCO/VICODIN) 5-325 MG per tablet Take 1/2 to 1 tablet by mouth three times daily as needed for pain  90 tablet  0  . losartan (COZAAR) 25 MG tablet Take 1 tablet (25 mg total) by mouth at bedtime.  30 tablet  1  . meclizine (ANTIVERT) 25 MG tablet Take 1/2 to 1 tablet by mouth every 6 hours as needed for dizziness  90 tablet  3  . metoprolol tartrate (LOPRESSOR) 25 MG tablet Take 25 mg by mouth 2 (two) times daily.       . montelukast (SINGULAIR) 10 MG tablet Take 1 tablet (10 mg total) by mouth at bedtime.  90 tablet  3  . Omega-3 Fatty Acids (FISH OIL) 1000 MG CAPS Take 1 capsule by mouth daily.        [name]  polyethylene glycol (MIRALAX / GLYCOLAX) packet Take 17 g by mouth daily as needed.       . potassium chloride SA (K-DUR,KLOR-CON) 20 MEQ tablet Take 20 mEq by mouth daily.      . predniSONE (DELTASONE) 5 MG tablet Take 5 mg by mouth daily.       . ranitidine (ZANTAC) 150 MG capsule Take 150 mg by mouth 2 (two) times daily.      . Rivaroxaban (XARELTO) 20 MG TABS tablet Take 20 mg by mouth daily with supper.      . simvastatin (ZOCOR) 80 MG tablet Take 80 mg by mouth at bedtime.        [name] tiotropium (SPIRIVA) 18 MCG inhalation capsule Place 1 capsule (18 mcg total) into inhaler and inhale daily.  90 capsule  3  . vitamin C (ASCORBIC ACID) 500 MG tablet Take 500 mg by mouth daily.       No current facility-administered medications for this visit.   Facility-Administered Medications Ordered in Other Visits  Medication Dose Route Frequency Provider Last Rate Last Dose  . 0.9 %  sodium chloride infusion    Continuous PRN Hessie Dibble,  CRNA        Allergies  Allergen Reactions  . Penicillins Anaphylaxis and Hives    REACTION: Allergic to PCN w/ throat swelling \\T \ hives    History   Social History  . Marital Status: Married    Spouse Name: [name]    Number of Children: N  . Years of Education: N/A   Occupational History  . [employer]     retired  . DRIVER    Social History Main Topics  . Smoking status: Former Smoker -- 1.50 packs/day for 54 years    Types: Cigarettes    Quit date: 10/18/1996  . Smokeless tobacco: Former Neurosurgeon    Types: Chew     Comment: only used chewing tobacco while working in the yard a couple of times  . Alcohol Use: No  . Drug Use: No  . Sexual Activity: Not on file   Other Topics Concern  . Not on file   Social History Narrative   No alcohol.   Drinks 1 cup of caffeine.     Review of Systems: General: negative for chills, fever, night sweats or weight changes.  Cardiovascular: negative for chest pain, edema, orthopnea, palpitations, paroxysmal nocturnal dyspnea  Dermatological: negative for rash Respiratory: negative for cough or wheezing Urologic: negative for hematuria Abdominal: negative for nausea, vomiting, diarrhea, bright red blood per rectum, melena, or hematemesis Neurologic: negative for visual changes, syncope, or dizziness All other systems reviewed and are otherwise negative except as noted above.    Blood pressure 112/60, pulse 72, height 5\' 9"  (1.753 m), weight 198 lb (89.812 kg).  General appearance: alert, cooperative, no distress and mildly obese Neck: RCE scar and RCA bruit Lungs: decreased breath sounds on Rt, Lt clear Heart: regular rate and rhythm Extremities: no edema  EKG Paced rhythm with ? PACs   ASSESSMENT AND PLAN:   Acute on chronic systolic CHF (congestive heart failure) Just discharged, doing well.  PAF- Amiodarone added 10/14 He may still be in AF with CVR by EKG  COPD On chronic low dose steroids.  Cardiac  pacemaker in situ- St Jude for CHB 7/09- pacer dependednt .  CAD s/p CABG '05, patent grafts 4/09, low risk Nuc 2011 .  Cardiomyopathy, ischemic- EF 30-35% this admission .  Chronic anticoagulation .  PLAN  I checked a BMP today. I discussed wgt goals with the pt and his wife and they seem to understand what we are trying to do there. I will have Dr Royann Shivers see him in three weeks. I can't be sure he was in NSR by his EKG, this will require a pacer interrogation. We may also want to check his EF again if he is in NSR before we commit him to a Bi V ICD - Dr Royann Shivers will evaluate this at his OV.  Adline Kirshenbaum KPA-C 08/16/2013 8:39 AM

## 2013-08-16 NOTE — Assessment & Plan Note (Signed)
Just discharged, doing well.

## 2013-08-17 ENCOUNTER — Telehealth: Payer: Self-pay | Admitting: Pulmonary Disease

## 2013-08-17 NOTE — Telephone Encounter (Signed)
Pharmacist returning call can be reached at [phone].[name]

## 2013-08-17 NOTE — Telephone Encounter (Signed)
Called and lmom for the pharmacist to call back for information.

## 2013-08-17 NOTE — Telephone Encounter (Signed)
LMOM to r/c to our office.   Ref # [id number] [facility] [phone] rachel  Needing supervising physician name and TPs  NPI#

## 2013-08-17 NOTE — Telephone Encounter (Signed)
[facility] aware of information requested. Nothing further needed.  Medication to be filled.

## 2013-08-17 NOTE — Telephone Encounter (Signed)
[email or web address]  

## 2013-08-17 NOTE — Telephone Encounter (Signed)
Returned call. Please call back at [phone] ext [phone] rachel

## 2013-08-22 ENCOUNTER — Ambulatory Visit: Payer: Medicare Other | Admitting: Cardiology

## 2013-08-29 NOTE — Progress Notes (Signed)
Pt. Has been notified of his lab results

## 2013-09-06 ENCOUNTER — Ambulatory Visit (INDEPENDENT_AMBULATORY_CARE_PROVIDER_SITE_OTHER): Payer: 59 | Admitting: Cardiovascular Disease

## 2013-09-06 ENCOUNTER — Encounter: Payer: Self-pay | Admitting: Cardiovascular Disease

## 2013-09-06 VITALS — BP 130/68 | HR 62 | Ht 69.0 in | Wt 198.9 lb

## 2013-09-06 DIAGNOSIS — I2510 Atherosclerotic heart disease of native coronary artery without angina pectoris: Secondary | ICD-10-CM

## 2013-09-06 DIAGNOSIS — Z950 Presence of cardiac pacemaker: Secondary | ICD-10-CM

## 2013-09-06 DIAGNOSIS — R5381 Other malaise: Secondary | ICD-10-CM

## 2013-09-06 DIAGNOSIS — I442 Atrioventricular block, complete: Secondary | ICD-10-CM

## 2013-09-06 DIAGNOSIS — I509 Heart failure, unspecified: Secondary | ICD-10-CM

## 2013-09-06 DIAGNOSIS — Z79899 Other long term (current) drug therapy: Secondary | ICD-10-CM

## 2013-09-06 DIAGNOSIS — I4891 Unspecified atrial fibrillation: Secondary | ICD-10-CM

## 2013-09-06 DIAGNOSIS — I5023 Acute on chronic systolic (congestive) heart failure: Secondary | ICD-10-CM

## 2013-09-06 LAB — MDC_IDC_ENUM_SESS_TYPE_INCLINIC
Battery Voltage: 2.75 V
Brady Statistic RV Percent Paced: 94 %
Implantable Pulse Generator Model: 5826
Implantable Pulse Generator Serial Number: 1247203
Lead Channel Impedance Value: 315 Ohm
Lead Channel Impedance Value: 358 Ohm
Lead Channel Pacing Threshold Amplitude: 0.5 V
Lead Channel Pacing Threshold Amplitude: 0.75 V
Lead Channel Pacing Threshold Pulse Width: 0.5 ms
Lead Channel Pacing Threshold Pulse Width: 0.5 ms
Lead Channel Setting Pacing Amplitude: 1.5 V
Lead Channel Setting Pacing Amplitude: 2 V
Lead Channel Setting Sensing Sensitivity: 0.3 mV

## 2013-09-06 LAB — PACEMAKER DEVICE OBSERVATION

## 2013-09-06 MED ORDER — POTASSIUM CHLORIDE CRYS ER 20 MEQ PO TBCR: 20.0000 meq | EXTENDED_RELEASE_TABLET | Freq: Every day | ORAL | Status: DC

## 2013-09-06 MED ORDER — AMIODARONE HCL 200 MG PO TABS: 200.0000 mg | ORAL_TABLET | Freq: Every day | ORAL | Status: DC

## 2013-09-06 MED ORDER — DILTIAZEM HCL ER COATED BEADS 180 MG PO CP24: 180.0000 mg | ORAL_CAPSULE | Freq: Every day | ORAL | Status: DC

## 2013-09-06 MED ORDER — RANITIDINE HCL 150 MG PO CAPS: 150.0000 mg | ORAL_CAPSULE | Freq: Two times a day (BID) | ORAL | Status: DC

## 2013-09-06 MED ORDER — FUROSEMIDE 40 MG PO TABS: 40.0000 mg | ORAL_TABLET | Freq: Every day | ORAL | Status: DC

## 2013-09-06 MED ORDER — SIMVASTATIN 80 MG PO TABS: 80.0000 mg | ORAL_TABLET | Freq: Every day | ORAL | Status: DC

## 2013-09-06 MED ORDER — LOSARTAN POTASSIUM 25 MG PO TABS: 25.0000 mg | ORAL_TABLET | Freq: Every day | ORAL | Status: DC

## 2013-09-06 MED ORDER — RIVAROXABAN 20 MG PO TABS: 20.0000 mg | ORAL_TABLET | Freq: Every day | ORAL | Status: DC

## 2013-09-06 NOTE — Assessment & Plan Note (Deleted)
His ideal "dry weight" seems to be around 190-195 pounds on his home scale and our office scale shows a weight that is 3-4 pounds higher. Had overt congestive heart failure with a weight of 210 pounds. His weight will be invaluable in helping [place] distinguish between cardiac and pulmonary causes of shortness of breath since he has serious respiratory issues as well. He is instructed in daily weight monitoring and has been doing a great job with this so far. Sodium restriction is reinforced. I have recommended that he take an extra 40 mg of furosemide and extra 40 mEq KCl when his weight exceeds 195 pounds on his home scale.

## 2013-09-06 NOTE — Patient Instructions (Addendum)
Your physician recommends that you return for lab work in: January (CMP/TSH).    Your physician recommends that you schedule a follow-up appointment in:  3 months for an office visit and pacemaker check.

## 2013-09-06 NOTE — Assessment & Plan Note (Addendum)
Very low burden of atrial fibrillation since addition of treatment with amiodarone. If he does not have atrial fibrillation when we recheck his pacemaker in about 3 months, I would probably recommend discontinuing the amiodarone, since A. fib was more likely the consequence of heart failure exacerbation rather than its cause the. He is on appropriate anticoagulation with Xarelto with only minor bleeding side effects. He has not had a stroke or TIA. Will check liver function tests and thyroid function studies in January.

## 2013-09-06 NOTE — Assessment & Plan Note (Addendum)
St. Jude zephyr XL model 5826 implanted June of 2009 for complete heart block. He should be considered pacemaker dependent, although he only has 94% ventricular pacing. 74% atrial pacing. Thresholds, sensing, impedances consistent with previous measurements. Device programmed to maximize longevity. 11 mode switches (<1%)---max dur. 20 sec, Max A 183---last 08-29-2013.[name] No high  ventricular rates noted. Device programmed at appropriate safety margins. Histogram distribution appropriate for patient activity level. Device programmed to optimize intrinsic conduction. Estimated longevity 5-6.5 years.  Consider upgrade to a CRT P. it seems that the atrial fibrillation was a consequence of heart failure exacerbation rather than vice versa. The risks of complications associated with pacemaker upgrade need to be weighed against the benefits of this therapy. Would wait another few months before making that decision.

## 2013-09-06 NOTE — Assessment & Plan Note (Addendum)
His ideal "dry weight" seems to be around 190-195 pounds on his home scale and our office scale shows a weight that is 3-4 pounds higher. Had overt congestive heart failure with a weight of 210 pounds. His weight will be invaluable in helping [place] distinguish between cardiac and pulmonary causes of shortness of breath since he has serious respiratory issues as well. He is instructed in daily weight monitoring and has been doing a great job with this so far. Sodium restriction is reinforced. I have recommended that he take an extra 40 mg of furosemide and extra 40 mEq KCl when his weight exceeds 195 pounds on his home scale. His optimal weight has been a bit of a "moving target" since yesterday lost weight over the last few years.

## 2013-09-06 NOTE — Progress Notes (Signed)
Patient ID: CELIA GIBBONS, male   DOB: Aug 20, 1931, 79 y.o.   MRN: [id number]      Reason for office visit Followup congestive heart failure, complete heart block, paroxysmal atrial fibrillation, coronary artery disease  Mr. Hobin has advanced ischemic cardiomyopathy with moderately depressed left ventricular systolic function. He underwent bypass surgery in 2005. His LV EF is around 35-40%. He was recently admitted with signs and symptoms of right and left heart failure associated with an episode of atrial fibrillation. He has improved substantially after treatment with diuretics and converted to sinus rhythm shortly after initiation of amiodarone therapy.. He has been doing a very good job of monitoring his sodium intake and his weight at home. It seems that he does best when his home scale shows weights between 190 and 195 pounds. This is substantially less than his previous estimated "dry weight" and he has lost over 40 pounds in the last two years.  He has not had problems with dizziness or syncope. He has no edema at all at this time.   He paces the right ventricle almost 100% of the time and we have discussed possible "upgrade" to a biventricular pacemaker.   Allergies  Allergen Reactions  . Penicillins Anaphylaxis and Hives    REACTION: Allergic to PCN w/ throat swelling \\T \ hives    Current Outpatient Prescriptions  Medication Sig Dispense Refill  . ALPRAZolam (XANAX) 0.5 MG tablet Take 1 tablet (0.5 mg total) by mouth 3 (three) times daily as needed for anxiety. Not to exceed 3 per day.  270 tablet  1  . amiodarone (PACERONE) 200 MG tablet Take 1 tablet (200 mg total) by mouth daily.  90 tablet  3  . aspirin 81 MG tablet Take 81 mg by mouth daily.        . budesonide-formoterol (SYMBICORT) 160-4.5 MCG/ACT inhaler Inhale 2 puffs into the lungs 2 (two) times daily.  3 Inhaler  3  . Coenzyme Q10 (COQ10) 100 MG CAPS Take 100 mg by mouth daily.       [name] diltiazem (CARDIZEM CD) 180  MG 24 hr capsule Take 1 capsule (180 mg total) by mouth daily.  90 capsule  3  . docusate sodium (COLACE) 100 MG capsule Take 200 mg by mouth daily.       . fluticasone (FLONASE) 50 MCG/ACT nasal spray Place 1 spray into the nose 2 (two) times daily.  48 g  3  . furosemide (LASIX) 40 MG tablet Take 1 tablet (40 mg total) by mouth daily.  90 tablet  3  . guaiFENesin (MUCINEX) 600 MG 12 hr tablet Take 2 tablets (1,200 mg total) by mouth 2 (two) times daily as needed for congestion.      [name] HYDROcodone-acetaminophen (NORCO/VICODIN) 5-325 MG per tablet Take 1/2 to 1 tablet by mouth three times daily as needed for pain  90 tablet  0  . losartan (COZAAR) 25 MG tablet Take 1 tablet (25 mg total) by mouth at bedtime.  90 tablet  3  . meclizine (ANTIVERT) 25 MG tablet Take 1/2 to 1 tablet by mouth every 6 hours as needed for dizziness  90 tablet  3  . metoprolol tartrate (LOPRESSOR) 25 MG tablet Take 25 mg by mouth 2 (two) times daily.       . montelukast (SINGULAIR) 10 MG tablet Take 1 tablet (10 mg total) by mouth at bedtime.  90 tablet  3  . Omega-3 Fatty Acids (FISH OIL) 1000 MG CAPS Take 1 capsule  by mouth daily.        . polyethylene glycol (MIRALAX / GLYCOLAX) packet Take 17 g by mouth daily as needed.       . potassium chloride SA (K-DUR,KLOR-CON) 20 MEQ tablet Take 1 tablet (20 mEq total) by mouth daily.  90 tablet  3  . predniSONE (DELTASONE) 5 MG tablet Take 5 mg by mouth daily.       . ranitidine (ZANTAC) 150 MG capsule Take 1 capsule (150 mg total) by mouth 2 (two) times daily.  180 capsule  3  . Rivaroxaban (XARELTO) 20 MG TABS tablet Take 1 tablet (20 mg total) by mouth daily with supper.  90 tablet  3  . simvastatin (ZOCOR) 80 MG tablet Take 1 tablet (80 mg total) by mouth at bedtime.  90 tablet  3  . tiotropium (SPIRIVA) 18 MCG inhalation capsule Place 1 capsule (18 mcg total) into inhaler and inhale daily.  90 capsule  3  . vitamin C (ASCORBIC ACID) 500 MG tablet Take 500 mg by mouth daily.        No current facility-administered medications for this visit.   Facility-Administered Medications Ordered in Other Visits  Medication Dose Route Frequency Provider Last Rate Last Dose  . 0.9 %  sodium chloride infusion    Continuous PRN Hessie Dibble, CRNA        Past Medical History  Diagnosis Date  . OSA (obstructive sleep apnea)   . Bronchial pneumonia   . Bronchitis, chronic with acute exacerbation   . COPD (chronic obstructive pulmonary disease)   . Hypertension   . Arteriosclerotic heart disease   . Atrial fibrillation   . Cardiac pacemaker in situ 04/12/2008    St.Jude Zephyr  . Cerebrovascular disease   . Peripheral vascular disease   . Hypercholesteremia   . Obesity   . Diverticulosis of colon   . Colon polyps   . Hemorrhoids   . DJD (degenerative joint disease)   . Chronic low back pain   . Anxiety   . CHF (congestive heart failure)   . CHB (complete heart block)   . Carotid arterial disease   . CAD (coronary artery disease)   . S/P CABG (coronary artery bypass graft) 2005    Past Surgical History  Procedure Laterality Date  . Pta to right common femoral artery  2001    Dr. [name]  . Carotid endarterectomy  2002    bilateral.  Dr. Hart Rochester  . Coronary artery bypass graft  2005  . Pacemaker placement  July 2009    for CHB  Dr. Allyson Sabal. St. Jude  . Appendectomy  1948  . US echocardiography  11/06/2011    Mod. LVH w/mod. depressed systolic function,EF 35-40%,gobal LV hypokinesis,mildly dilated LA,mild aortic root dilatation  . Nm myoview ltd  09/04/2010    mild perfusion defect basal inferior,mid inferior, & apical inferior regions. LV systolic function deteriorated since 2006  . Cardiac catheterization  02/02/2008    patent grafts    Family History  Problem Relation Age of Onset  . Asthma Paternal Uncle   . Heart disease Paternal Grandmother   . Heart disease Mother   . Heart attack Father   . Rheum arthritis Father   . Rheum arthritis  Paternal Uncle   . Pancreatic cancer Mother   . Hyperlipidemia Mother     History   Social History  . Marital Status: Married    Spouse Name: [name]    Number of Children: N  .  Years of Education: N/A   Occupational History  . [employer]     retired  . DRIVER    Social History Main Topics  . Smoking status: Former Smoker -- 1.50 packs/day for 54 years    Types: Cigarettes    Quit date: 10/18/1996  . Smokeless tobacco: Former Neurosurgeon    Types: Chew     Comment: only used chewing tobacco while working in the yard a couple of times  . Alcohol Use: No  . Drug Use: No  . Sexual Activity: Not on file   Other Topics Concern  . Not on file   Social History Narrative   No alcohol.   Drinks 1 cup of caffeine.    Review of systems: He has mild distal exertion, NYHA class II. The patient specifically denies any chest pain at rest or with exertion, dyspnea at rest, orthopnea, paroxysmal nocturnal dyspnea, syncope, palpitations, focal neurological deficits, intermittent claudication, lower extremity edema, unexplained weight gain, cough, hemoptysis or wheezing.  The patient also denies abdominal pain, nausea, vomiting, dysphagia, diarrhea, constipation, polyuria, polydipsia, dysuria, hematuria, frequency, urgency, abnormal bleeding or bruising, fever, chills, unexpected weight changes, mood swings, change in skin or hair texture, change in voice quality, auditory or visual problems, allergic reactions or rashes, new musculoskeletal complaints other than usual "aches and pains".   PHYSICAL EXAM BP 130/68  Pulse 62  Ht 5\' 9"  (1.753 m)  Wt 198 lb 14.4 oz (90.22 kg)  BMI 29.36 kg/m2 General appearance: alert, cooperative and no distress Neck: no adenopathy, no carotid bruit, no JVD, supple, symmetrical, trachea midline and thyroid not enlarged, symmetric, no tenderness/mass/nodules Lungs: Emphysematous chest, mildly diminished breath sounds throughout, no wheezes, no signs  of consultation or percussion or auscultation; sternotomy scar, healthy left subclavian pacemaker site Heart: regular rate and rhythm, S1: normal, S2: paradoxically splitting, systolic murmur: holosystolic 2/6,   at lower left sternal border and no gallops Abdomen: soft, non-tender; bowel sounds normal; no masses,  no organomegaly Extremities: extremities normal, atraumatic, no cyanosis or edema Pulses: 2+ and symmetric Skin: Skin color, texture, turgor normal. No rashes or lesions Neurologic: Alert and oriented X 3, normal strength and tone. Normal symmetric reflexes. Normal coordination and gait\  EKG: AV sequential pacing  Lipid Panel     Component Value Date/Time   CHOL 109 04/24/2013 0849   TRIG 99.0 04/24/2013 0849   HDL 35.70* 04/24/2013 0849   CHOLHDL 3 04/24/2013 0849   VLDL 19.8 04/24/2013 0849   LDLCALC 54 04/24/2013 0849    BMET    Component Value Date/Time   NA 138 08/16/2013 0904   K 4.1 08/16/2013 0904   CL 100 08/16/2013 0904   CO2 32 08/16/2013 0904   GLUCOSE 106* 08/16/2013 0904   GLUCOSE 109* 09/22/2006 1133   BUN 21 08/16/2013 0904   CREATININE 0.87 08/16/2013 0904   CREATININE 0.91 08/14/2013 0600   CALCIUM 8.6 08/16/2013 0904   GFRNONAA 78* 08/14/2013 0600   GFRAA >90 08/14/2013 0600     ASSESSMENT AND PLAN Cardiac pacemaker in situ- St Jude for CHB 7/09- pacer dependednt St. Jude zephyr XL model 5826 implanted June of 2009 for complete heart block. He should be considered pacemaker dependent, although he only has 94% ventricular pacing. 74% atrial pacing. Thresholds, sensing, impedances consistent with previous measurements. Device programmed to maximize longevity. 11 mode switches (<1%)---max dur. 20 sec, Max A 183---last 08-29-2013.[name] No high  ventricular rates noted. Device programmed at appropriate safety margins. Histogram distribution  appropriate for patient activity level. Device programmed to optimize intrinsic conduction. Estimated longevity 5-6.5 years.    Consider upgrade to a CRT P. it seems that the atrial fibrillation was a consequence of heart failure exacerbation rather than vice versa. The risks of complications associated with pacemaker upgrade need to be weighed against the benefits of this therapy. Would wait another few months before making that decision.  PAF- Amiodarone added 10/14 Very low burden of atrial fibrillation since addition of treatment with amiodarone. If he does not have atrial fibrillation when we recheck his pacemaker in about 3 months, I would probably recommend discontinuing the amiodarone, since A. fib was more likely the consequence of heart failure exacerbation rather than its cause the. He is on appropriate anticoagulation with Xarelto with only minor bleeding side effects. He has not had a stroke or TIA. Will check liver function tests and thyroid function studies in January.  Acute on chronic systolic CHF (congestive heart failure) His ideal "dry weight" seems to be around 190-195 pounds on his home scale and our office scale shows a weight that is 3-4 pounds higher. Had overt congestive heart failure with a weight of 210 pounds. His weight will be invaluable in helping [place] distinguish between cardiac and pulmonary causes of shortness of breath since he has serious respiratory issues as well. He is instructed in daily weight monitoring and has been doing a great job with this so far. Sodium restriction is reinforced. I have recommended that he take an extra 40 mg of furosemide and extra 40 mEq KCl when his weight exceeds 195 pounds on his home scale. His optimal weight has been a bit of a "moving target" since yesterday lost weight over the last few years.  CAD s/p CABG '05, patent grafts 4/09, low risk Nuc 2011 2005, Dr. Tyrone Sage, LIMA to LAD, SVG to intermediate, SVG to second intermedius; Angiography 2009 shows all grafts patent, nondominant right coronary artery sequential moderate stenoses in the AV groove portion  left circumflex coronary artery. LVEF is around 35% by echo. He does not have angina pectoris. His nuclear stress test in 2011 showed the old inferior wall scar LVEF of 38% and no areas of ischemia.   Orders Placed This Encounter  Procedures  . TSH  . Comp Met (CMET)  . Implantable device check  . EKG 12-Lead   Meds ordered this encounter  Medications  . amiodarone (PACERONE) 200 MG tablet    Sig: Take 1 tablet (200 mg total) by mouth daily.    Dispense:  90 tablet    Refill:  3  . diltiazem (CARDIZEM CD) 180 MG 24 hr capsule    Sig: Take 1 capsule (180 mg total) by mouth daily.    Dispense:  90 capsule    Refill:  3  . furosemide (LASIX) 40 MG tablet    Sig: Take 1 tablet (40 mg total) by mouth daily.    Dispense:  90 tablet    Refill:  3  . losartan (COZAAR) 25 MG tablet    Sig: Take 1 tablet (25 mg total) by mouth at bedtime.    Dispense:  90 tablet    Refill:  3  . potassium chloride SA (K-DUR,KLOR-CON) 20 MEQ tablet    Sig: Take 1 tablet (20 mEq total) by mouth daily.    Dispense:  90 tablet    Refill:  3  . ranitidine (ZANTAC) 150 MG capsule    Sig: Take 1 capsule (150 mg total) by  mouth 2 (two) times daily.    Dispense:  180 capsule    Refill:  3  . Rivaroxaban (XARELTO) 20 MG TABS tablet    Sig: Take 1 tablet (20 mg total) by mouth daily with supper.    Dispense:  90 tablet    Refill:  3  . simvastatin (ZOCOR) 80 MG tablet    Sig: Take 1 tablet (80 mg total) by mouth at bedtime.    Dispense:  90 tablet    Refill:  3    Cru Kritikos  Thurmon Fair, MD, [facility] [phone] office [phone] pager

## 2013-09-06 NOTE — Assessment & Plan Note (Addendum)
2005, Dr. Tyrone Sage, LIMA to LAD, SVG to intermediate, SVG to second intermedius; Angiography 2009 shows all grafts patent, nondominant right coronary artery sequential moderate stenoses in the AV groove portion left circumflex coronary artery. LVEF is around 35% by echo. He does not have angina pectoris. His nuclear stress test in 2011 showed the old inferior wall scar LVEF of 38% and no areas of ischemia.

## 2013-09-07 ENCOUNTER — Encounter: Payer: Self-pay | Admitting: Cardiovascular Disease

## 2013-09-07 ENCOUNTER — Telehealth: Payer: Self-pay

## 2013-09-07 NOTE — Telephone Encounter (Signed)
Prior Authorization for patient's Xarelto has been approved through 09/06/2014. Case ID #[id number]. Pharmacy notified.  Attempted to contact patient twice - no answer.

## 2013-09-10 ENCOUNTER — Telehealth: Payer: Self-pay | Admitting: Cardiovascular Disease

## 2013-09-10 ENCOUNTER — Other Ambulatory Visit: Payer: Self-pay | Admitting: *Deleted

## 2013-09-10 MED ORDER — LOSARTAN POTASSIUM 25 MG PO TABS: 25.0000 mg | ORAL_TABLET | Freq: Every day | ORAL | Status: DC

## 2013-09-10 MED ORDER — FUROSEMIDE 40 MG PO TABS: 40.0000 mg | ORAL_TABLET | Freq: Every day | ORAL | Status: DC

## 2013-09-10 MED ORDER — POTASSIUM CHLORIDE CRYS ER 20 MEQ PO TBCR: 20.0000 meq | EXTENDED_RELEASE_TABLET | Freq: Every day | ORAL | Status: DC

## 2013-09-10 MED ORDER — AMIODARONE HCL 200 MG PO TABS: 200.0000 mg | ORAL_TABLET | Freq: Every day | ORAL | Status: DC

## 2013-09-10 MED ORDER — RIVAROXABAN 20 MG PO TABS: 20.0000 mg | ORAL_TABLET | Freq: Every day | ORAL | Status: DC

## 2013-09-10 MED ORDER — DILTIAZEM HCL ER COATED BEADS 180 MG PO CP24: 180.0000 mg | ORAL_CAPSULE | Freq: Every day | ORAL | Status: DC

## 2013-09-10 MED ORDER — METOPROLOL TARTRATE 25 MG PO TABS: 25.0000 mg | ORAL_TABLET | Freq: Two times a day (BID) | ORAL | Status: DC

## 2013-09-10 MED ORDER — SIMVASTATIN 80 MG PO TABS: 80.0000 mg | ORAL_TABLET | Freq: Every day | ORAL | Status: DC

## 2013-09-10 NOTE — Telephone Encounter (Signed)
Forwarded to B. Lassiter, CMA.  

## 2013-09-10 NOTE — Telephone Encounter (Signed)
Was in office 11/20 and all his refills were called in to wrong pharmacy.  Called to [facility] on [address]  Should be called to [facility].  Please call her to clarify this will be called in .  All written 90 days with 3 refills.

## 2013-09-10 NOTE — Telephone Encounter (Signed)
meds refilled with [facility] #90 x 3 refills.  Lab order mailed to patient.

## 2013-09-11 NOTE — Telephone Encounter (Signed)
[facility].  Wife notified and voiced understanding.

## 2013-09-12 ENCOUNTER — Other Ambulatory Visit: Payer: Self-pay | Admitting: *Deleted

## 2013-09-12 ENCOUNTER — Telehealth: Payer: Self-pay | Admitting: Cardiovascular Disease

## 2013-09-12 DIAGNOSIS — R5381 Other malaise: Secondary | ICD-10-CM

## 2013-09-12 DIAGNOSIS — Z79899 Other long term (current) drug therapy: Secondary | ICD-10-CM

## 2013-09-12 NOTE — Telephone Encounter (Signed)
Orders for labs were released and ready to be collected. Patient's wife aware.

## 2013-09-12 NOTE — Telephone Encounter (Signed)
Pt wants to do lab work today-would you please fax to the lab on [address] . Dr Kriste Basque is his primary doctor,she did not have the fax number.

## 2013-09-13 DIAGNOSIS — J449 Chronic obstructive pulmonary disease, unspecified: Secondary | ICD-10-CM

## 2013-09-13 DIAGNOSIS — I2510 Atherosclerotic heart disease of native coronary artery without angina pectoris: Secondary | ICD-10-CM

## 2013-09-13 DIAGNOSIS — I2589 Other forms of chronic ischemic heart disease: Secondary | ICD-10-CM

## 2013-09-13 DIAGNOSIS — Z950 Presence of cardiac pacemaker: Secondary | ICD-10-CM

## 2013-09-13 DIAGNOSIS — I4891 Unspecified atrial fibrillation: Secondary | ICD-10-CM

## 2013-09-13 DIAGNOSIS — Z7901 Long term (current) use of anticoagulants: Secondary | ICD-10-CM

## 2013-09-14 ENCOUNTER — Other Ambulatory Visit: Payer: Medicare Other

## 2013-09-14 LAB — COMPREHENSIVE METABOLIC PANEL
Alkaline Phosphatase: 57 U/L (ref 39–117)
BUN: 27 mg/dL — ABNORMAL HIGH (ref 6–23)
CO2: 25 mEq/L (ref 19–32)
Calcium: 9 mg/dL (ref 8.4–10.5)
Glucose, Bld: 94 mg/dL (ref 70–99)
Sodium: 140 mEq/L (ref 135–145)
Total Bilirubin: 0.6 mg/dL (ref 0.3–1.2)

## 2013-09-20 ENCOUNTER — Telehealth: Payer: Self-pay | Admitting: Pulmonary Disease

## 2013-09-20 MED ORDER — HYDROCODONE-ACETAMINOPHEN 5-325 MG PO TABS: ORAL_TABLET | ORAL | Status: DC

## 2013-09-20 NOTE — Telephone Encounter (Signed)
rx has been printed out and placed on SN cart to be signed.  i will call the pt once this is done.

## 2013-09-20 NOTE — Telephone Encounter (Signed)
Last OV with TP 08/09/13 Last OV with SN 04/24/13 No Pending OV Last fill by TP 08/09/13 #90  SN - please advise on refill. Thanks.

## 2013-09-20 NOTE — Telephone Encounter (Signed)
rx has been signed and left up front. i called pts wife and she is aware and will come by today to pick this up.

## 2013-09-21 ENCOUNTER — Telehealth: Payer: Self-pay | Admitting: *Deleted

## 2013-09-21 ENCOUNTER — Other Ambulatory Visit: Payer: Self-pay | Admitting: *Deleted

## 2013-09-21 MED ORDER — ATORVASTATIN CALCIUM 40 MG PO TABS: 40.0000 mg | ORAL_TABLET | Freq: Every day | ORAL | Status: DC

## 2013-09-21 NOTE — Telephone Encounter (Signed)
Per Dr. Salena Saner D/C Simvastatin and start Atorvastatin 40mg  qd.  [facility] notified and patient wife notified and voiced understanding.

## 2013-09-21 NOTE — Telephone Encounter (Signed)
After greater than 30 minutes [facility] will mail out patients meds.

## 2013-09-21 NOTE — Telephone Encounter (Signed)
[facility] needs authorization to ship out amiodarone/simvastatin/diltiazem and that the doctor is aware of the doses.  Authorized that Dr. Salena Saner. Is aware.

## 2013-09-30 ENCOUNTER — Other Ambulatory Visit: Payer: Self-pay | Admitting: Pulmonary Disease

## 2013-10-01 ENCOUNTER — Telehealth: Payer: Self-pay | Admitting: Cardiovascular Disease

## 2013-10-01 MED ORDER — LOSARTAN POTASSIUM 100 MG PO TABS: 100.0000 mg | ORAL_TABLET | Freq: Every day | ORAL | Status: DC

## 2013-10-01 NOTE — Telephone Encounter (Signed)
He should take the dose he was taking before, 100 mg daily

## 2013-10-01 NOTE — Telephone Encounter (Signed)
Returned call and informed pt per instructions by MD.  Informed Rx will be sent to [facility] w/ updated dose.  Pt verbalized understanding and agreed w/ plan.

## 2013-10-01 NOTE — Telephone Encounter (Signed)
Please call-mix up in his Losartan prescription.

## 2013-10-01 NOTE — Telephone Encounter (Signed)
Returned call and pt verified x 2 w/ pt's wife, Todd Kelly.  Stated at pt's last OV refills were sent for all of his meds and were right except for the losartan.  Stated pt has been taking 100 mg and they received 25 mg tabs.  RN reviewed chart and it appears at discharge on 10.28.14, the 100 mg was dc'd and 25 mg started.  Wife stated pt has been taking 100 mg since discharge.  Informed Dr. Royann Shivers will be notified for further instructions and if new Rx is needed, it will be sent to [facility].  Wife verbalized understanding.  Stated pt has already taken 100 mg today.  Message forwarded to Dr. Royann Shivers for further instructions.

## 2013-10-22 ENCOUNTER — Telehealth: Payer: Self-pay | Admitting: Pulmonary Disease

## 2013-10-22 DIAGNOSIS — E7800 Pure hypercholesterolemia, unspecified: Secondary | ICD-10-CM

## 2013-10-22 DIAGNOSIS — F411 Generalized anxiety disorder: Secondary | ICD-10-CM

## 2013-10-22 DIAGNOSIS — N320 Bladder-neck obstruction: Secondary | ICD-10-CM

## 2013-10-22 DIAGNOSIS — D126 Benign neoplasm of colon, unspecified: Secondary | ICD-10-CM

## 2013-10-22 DIAGNOSIS — I10 Essential (primary) hypertension: Secondary | ICD-10-CM

## 2013-10-22 MED ORDER — HYDROCODONE-ACETAMINOPHEN 5-325 MG PO TABS: ORAL_TABLET | ORAL | Status: DC

## 2013-10-22 NOTE — Telephone Encounter (Signed)
Called and spoke with pts wife and she is aware of labs in the computer for the pt.  She is aware of rx printed out and placed up front and she will come by this week to pick up the rx.

## 2013-10-23 MED ORDER — HYDROCODONE-ACETAMINOPHEN 5-325 MG PO TABS: ORAL_TABLET | ORAL | Status: DC

## 2013-10-23 NOTE — Telephone Encounter (Signed)
rx was printed out on 10-22-13 but could not find the rx.  Reprinted today to have SN sign and place up front for pts wife to pick up

## 2013-10-23 NOTE — Addendum Note (Signed)
Addended by: Elie Confer on: 10/23/2013 08:42 AM   Modules accepted: Orders

## 2013-10-24 ENCOUNTER — Ambulatory Visit: Payer: 59 | Admitting: Pulmonary Disease

## 2013-10-28 ENCOUNTER — Other Ambulatory Visit: Payer: Self-pay | Admitting: Pulmonary Disease

## 2013-11-02 ENCOUNTER — Other Ambulatory Visit (INDEPENDENT_AMBULATORY_CARE_PROVIDER_SITE_OTHER): Payer: 59

## 2013-11-02 DIAGNOSIS — D126 Benign neoplasm of colon, unspecified: Secondary | ICD-10-CM

## 2013-11-02 DIAGNOSIS — F411 Generalized anxiety disorder: Secondary | ICD-10-CM

## 2013-11-02 DIAGNOSIS — N320 Bladder-neck obstruction: Secondary | ICD-10-CM

## 2013-11-02 DIAGNOSIS — E7800 Pure hypercholesterolemia, unspecified: Secondary | ICD-10-CM

## 2013-11-02 DIAGNOSIS — I10 Essential (primary) hypertension: Secondary | ICD-10-CM

## 2013-11-02 LAB — BASIC METABOLIC PANEL
BUN: 34 mg/dL — ABNORMAL HIGH (ref 6–23)
CO2: 28 mEq/L (ref 19–32)
Calcium: 8.8 mg/dL (ref 8.4–10.5)
Chloride: 107 mEq/L (ref 96–112)
Creatinine, Ser: 1.4 mg/dL (ref 0.4–1.5)
GFR: 52.63 mL/min — ABNORMAL LOW (ref >60.00)
Glucose, Bld: 85 mg/dL (ref 70–99)
POTASSIUM: 4 meq/L (ref 3.5–5.1)
Sodium: 142 mEq/L (ref 135–145)

## 2013-11-02 LAB — HEPATIC FUNCTION PANEL
ALT: 12 U/L (ref 0–53)
AST: 15 U/L (ref 0–37)
Albumin: 3.5 g/dL (ref 3.5–5.2)
Alkaline Phosphatase: 49 U/L (ref 39–117)
BILIRUBIN DIRECT: 0.1 mg/dL (ref 0.0–0.3)
BILIRUBIN TOTAL: 0.4 mg/dL (ref 0.3–1.2)
Total Protein: 5.7 g/dL — ABNORMAL LOW (ref 6.0–8.3)

## 2013-11-02 LAB — CBC WITH DIFFERENTIAL/PLATELET
Basophils Absolute: 0 10*3/uL (ref 0.0–0.1)
Basophils Relative: 0.4 % (ref 0.0–3.0)
EOS PCT: 1.6 % (ref 0.0–5.0)
Eosinophils Absolute: 0.2 10*3/uL (ref 0.0–0.7)
HEMATOCRIT: 33.1 % — AB (ref 39.0–52.0)
Hemoglobin: 10.5 g/dL — ABNORMAL LOW (ref 13.0–17.0)
Lymphocytes Relative: 27.7 % (ref 12.0–46.0)
Lymphs Abs: 2.8 10*3/uL (ref 0.7–4.0)
MCHC: 31.7 g/dL (ref 30.0–36.0)
MCV: 78.2 fl (ref 78.0–100.0)
MONOS PCT: 9 % (ref 3.0–12.0)
Monocytes Absolute: 0.9 10*3/uL (ref 0.1–1.0)
NEUTROS PCT: 61.3 % (ref 43.0–77.0)
Neutro Abs: 6.1 10*3/uL (ref 1.4–7.7)
Platelets: 193 10*3/uL (ref 150.0–400.0)
RBC: 4.24 Mil/uL (ref 4.22–5.81)
RDW: 19.4 % — ABNORMAL HIGH (ref 11.5–14.6)
WBC: 9.9 10*3/uL (ref 4.5–10.5)

## 2013-11-02 LAB — LIPID PANEL
CHOL/HDL RATIO: 3
CHOLESTEROL: 108 mg/dL (ref 0–200)
HDL: 39.3 mg/dL (ref >39.00)
LDL CALC: 55 mg/dL (ref 0–99)
Triglycerides: 70 mg/dL (ref 0.0–149.0)
VLDL: 14 mg/dL (ref 0.0–40.0)

## 2013-11-02 LAB — TSH: TSH: 1.96 u[IU]/mL (ref 0.35–5.50)

## 2013-11-02 LAB — PSA: PSA: 1.93 ng/mL (ref 0.10–4.00)

## 2013-11-05 ENCOUNTER — Ambulatory Visit: Payer: 59 | Admitting: Pulmonary Disease

## 2013-11-08 ENCOUNTER — Encounter: Payer: Self-pay | Admitting: Gastroenterology

## 2013-11-08 ENCOUNTER — Other Ambulatory Visit (INDEPENDENT_AMBULATORY_CARE_PROVIDER_SITE_OTHER): Payer: 59

## 2013-11-08 ENCOUNTER — Ambulatory Visit (INDEPENDENT_AMBULATORY_CARE_PROVIDER_SITE_OTHER)
Admission: RE | Admit: 2013-11-08 | Discharge: 2013-11-08 | Disposition: A | Discharge status: Home / Self Care | Payer: 59 | Source: Ambulatory Visit | Attending: Pulmonary Disease | Admitting: Pulmonary Disease

## 2013-11-08 ENCOUNTER — Encounter: Payer: Self-pay | Admitting: Pulmonary Disease

## 2013-11-08 ENCOUNTER — Ambulatory Visit (INDEPENDENT_AMBULATORY_CARE_PROVIDER_SITE_OTHER): Payer: 59 | Admitting: Pulmonary Disease

## 2013-11-08 VITALS — BP 122/70 | HR 65 | Temp 96.8°F | Ht 68.0 in | Wt 199.2 lb

## 2013-11-08 DIAGNOSIS — D509 Iron deficiency anemia, unspecified: Secondary | ICD-10-CM

## 2013-11-08 DIAGNOSIS — I739 Peripheral vascular disease, unspecified: Secondary | ICD-10-CM

## 2013-11-08 DIAGNOSIS — M545 Low back pain, unspecified: Secondary | ICD-10-CM

## 2013-11-08 DIAGNOSIS — I2510 Atherosclerotic heart disease of native coronary artery without angina pectoris: Secondary | ICD-10-CM

## 2013-11-08 DIAGNOSIS — K921 Melena: Secondary | ICD-10-CM

## 2013-11-08 DIAGNOSIS — E538 Deficiency of other specified B group vitamins: Secondary | ICD-10-CM

## 2013-11-08 DIAGNOSIS — I5023 Acute on chronic systolic (congestive) heart failure: Secondary | ICD-10-CM

## 2013-11-08 DIAGNOSIS — R0600 Dyspnea, unspecified: Secondary | ICD-10-CM

## 2013-11-08 DIAGNOSIS — Z950 Presence of cardiac pacemaker: Secondary | ICD-10-CM

## 2013-11-08 DIAGNOSIS — M1990 Unspecified osteoarthritis, unspecified site: Secondary | ICD-10-CM

## 2013-11-08 DIAGNOSIS — J4489 Other specified chronic obstructive pulmonary disease: Secondary | ICD-10-CM

## 2013-11-08 DIAGNOSIS — I255 Ischemic cardiomyopathy: Secondary | ICD-10-CM

## 2013-11-08 DIAGNOSIS — E663 Overweight: Secondary | ICD-10-CM

## 2013-11-08 DIAGNOSIS — E7800 Pure hypercholesterolemia, unspecified: Secondary | ICD-10-CM

## 2013-11-08 DIAGNOSIS — R0609 Other forms of dyspnea: Secondary | ICD-10-CM

## 2013-11-08 DIAGNOSIS — I4891 Unspecified atrial fibrillation: Secondary | ICD-10-CM

## 2013-11-08 DIAGNOSIS — I10 Essential (primary) hypertension: Secondary | ICD-10-CM

## 2013-11-08 DIAGNOSIS — F411 Generalized anxiety disorder: Secondary | ICD-10-CM

## 2013-11-08 DIAGNOSIS — R0989 Other specified symptoms and signs involving the circulatory and respiratory systems: Secondary | ICD-10-CM

## 2013-11-08 DIAGNOSIS — I509 Heart failure, unspecified: Secondary | ICD-10-CM

## 2013-11-08 DIAGNOSIS — I2589 Other forms of chronic ischemic heart disease: Secondary | ICD-10-CM

## 2013-11-08 DIAGNOSIS — I679 Cerebrovascular disease, unspecified: Secondary | ICD-10-CM

## 2013-11-08 DIAGNOSIS — J449 Chronic obstructive pulmonary disease, unspecified: Secondary | ICD-10-CM

## 2013-11-08 LAB — VITAMIN B12: Vitamin B-12: 159 pg/mL — ABNORMAL LOW (ref 211–911)

## 2013-11-08 LAB — IBC PANEL
IRON: 41 ug/dL — AB (ref 42–165)
Saturation Ratios: 10.6 % — ABNORMAL LOW (ref 20.0–50.0)
TRANSFERRIN: 275.8 mg/dL (ref 212.0–360.0)

## 2013-11-08 LAB — BRAIN NATRIURETIC PEPTIDE: Pro B Natriuretic peptide (BNP): 659 pg/mL — ABNORMAL HIGH (ref 0.0–100.0)

## 2013-11-08 MED ORDER — TIOTROPIUM BROMIDE MONOHYDRATE 18 MCG IN CAPS: ORAL_CAPSULE | RESPIRATORY_TRACT | Status: AC

## 2013-11-08 MED ORDER — MONTELUKAST SODIUM 10 MG PO TABS: ORAL_TABLET | ORAL | Status: DC

## 2013-11-08 MED ORDER — MECLIZINE HCL 25 MG PO TABS: ORAL_TABLET | ORAL | Status: AC

## 2013-11-08 MED ORDER — FLUTICASONE PROPIONATE 50 MCG/ACT NA SUSP: 1.0000 | Freq: Two times a day (BID) | NASAL | Status: DC

## 2013-11-08 MED ORDER — BUDESONIDE-FORMOTEROL FUMARATE 160-4.5 MCG/ACT IN AERO: 2.0000 | INHALATION_SPRAY | Freq: Two times a day (BID) | RESPIRATORY_TRACT | Status: DC

## 2013-11-08 MED ORDER — ALPRAZOLAM 0.5 MG PO TABS
0.5000 mg | ORAL_TABLET | Freq: Three times a day (TID) | ORAL | Status: AC | PRN
Start: 2013-11-08 — End: ?

## 2013-11-08 NOTE — Patient Instructions (Signed)
Today we updated your med list in our EPIC system...    Continue your current medications the same...  Add an IRON table 325mg  Ferrous sulfate (OTC) and take it w/ your 500mg  VitC tab...  Today we did a follow up CXR and blood test for your iron level...    We will contact you w/ the results when available...   We will also arrangwe for a consultation w/ DrStark about the blood in your stools & see what options he has to offer regarding further eval of this problem...  Call for any questions...  Let's plan a follow up visit in 2-30mo to recheck your blood count & Iron level.Marland KitchenMarland Kitchen

## 2013-11-08 NOTE — Progress Notes (Signed)
Subjective:    Patient ID: Todd Kelly, male    DOB: 17-Nov-1930, 80 y.o.   MRN: [id number]  HPI 80 y/o WM here for a follow up visit... he has mult med problems including:  COPD/ AB/ ex-smoker;  HBP/ ASHD/ AFib/ Pacer- followed by DrSolomon/ [facility];  Cerebrovasc & peripheral vasc disease;  Hyperlipidemia/ Obesity;  DJD/ LBP;  Anxiety, etc...  ~  April 10, 2012:  37mo ROV & Todd Kelly has had a stable 58mo interval just c/o fatigue, lack of energy, etc; he is followed regularly for Cards by DrCroitoru- last seen 4/13 & his note is reviewed> Meds have been adjusted w/ LASIX increased then cut back down- he has lost 16+lbs w/ this diuresis & he is going to continue the Lasix40 therefore we will discontinue his HCT12.5.Marland KitchenMarland Kitchen SEE MED RECON BELOW>>    COPD> on Pred10mg -1alt w/ 1/2 qod, Symbicort160, Spiriva, Singulair; breathing is improved= min wheezing w/ exertion, mild cough, beige sput, DOE w/o change, no edema; [facility] & he is asked to walk on the other days...    HBP> on Lopressor25Bid, Cozaar100, Lasix40/d now, KCl20/d ; BP= 122/68 & similar at home; denies CP, palpit, syncope, ch in SOB, edema...    CAD, s/p CABG, Cardiomyopathy w/ EF=35-40%> on ASA + the above; denies angina & [facility] he says; CHF improved after vigorous diuresis by DrCroitoru w/ Lasix80Bid, then slowly tapered & now at 40mg /d...    AFib, Pacer> on Coumadin + the above; Coumadin per [facility] CC==> recently changed from Coumadin to [place] 20mg /d...    Cerebrovasc dis> on ASA & Xarelto as noted; followed by [facility]; pt states last CDoppler was 10/11 & reported "OK", he denies cerebral ischemic symptoms.    ASPVD> on ASA & Xarelto as noted; followed by [facility] w/ prev right fem art thrombectomy & right RA stent...    Chol> on Simva80, FishOil, CoQ10; FLP 12/12 shows TChol 156, TG 195, HDL 42, LDL 75; needs better low fat diet & wt reduction...    Obesity> weight= 246# which represents a  16# wt reduction over 21mo; we reviewed diet/ exercise/ wt reduction strategies; great job!!!    GI> Divertics, Polyps, Hems> off Prilosec & on Ranitadine 150mg  Bid; on Miralax, Colase, [place]; obese protuberant abd; denies pain, N/ V/ D/ C/ blood etc...    DJD, LBP> on Vicodin & averages 1-2 daily...    Anxiety> on Xanax and averages 1/2 tab bid... We reviewed prob list, meds, xrays and labs> see below>> We do not have recent labs from [facility]...  ~  October 23, 2012:  81mo ROV & Todd Kelly has had a good interval w/o new complaints or concerns- he notes good days and bad;  He did not bring meds to the OV today... We reviewed the following medical problems during today's office visit >>     COPD> on Pred10mg -1/2 qd, Symbicort160, Spiriva, Singulair; breathing is improved= min wheezing w/ exertion, mild cough, beige sput, DOE w/o change, no edema; [facility] & he is asked to walk on the other days...    HBP> on Lopressor25Bid, Cozaar100, Lasix40Bid now, KCl20/d ; BP= 134/72 & similar at home; denies CP, palpit, syncope, ch in SOB, edema...    CAD, s/p CABG, Cardiomyopathy w/ EF=35-40%> on ASA + the above; denies angina & only exercise [facility] he says; CHF improved after vigorous diuresis by DrCroitoru w/ Lasix80Bid,  then slowly tapered to 40mg Bid...    AFib, Pacer> on Xarelto + the above; Coumadin changed to Xarelto per [facility] CC==> stable w/o bleeding problems etc...    Cerebrovasc dis> on ASA & Xarelto as noted; followed by [facility]; pt states last CDoppler was 10/11 & reported "OK", he denies cerebral ischemic symptoms.    ASPVD> on ASA & Xarelto as noted; followed by [facility] w/ prev right fem art thrombectomy & right RA stent...    Chol> on Simva80, FishOil, CoQ10; FLP 12/13 shows TChol 126, TG 177, HDL 34, LDL 57; needs better low fat diet & wt reduction...    Obesity> weight= 245# stable; we reviewed diet/ exercise/ wt reduction strategies...    GI> Divertics,  Polyps, Hems> off Prilosec & on Ranitadine 150mg Bid; on Miralax, Colase, AnusolHC; obese protuberant abd; denies pain, N/ V/ D/ C/ blood etc...    DJD, LBP> on Vicodin & averages 1-2 daily...    Anxiety> on Xanax and averages 1/2 tab bid... We reviewed prob list, meds, xrays and labs> see below for updates >> he had the 2013 Flu vaccine 11/13; requests refill Rx today...  CXR 1/14 showed mod cardiomeg, s/p CABG & pacer, clear lungs, mild DJD spine... LABS 12/13:  FLP- fair on Simva80 w/ TG=177 HDL=34;  Chems- ok x BS=125;  CBC- wnl;  TSH=1.94;  PSA=1.40  ~  April 25, 2103:  98mo ROV & Todd Kelly has done an incredible job w/ diet, exercise & wt reduction- weight down 23# to 211# today (35# over the last yr)!  Assoc w/ this he says he feels 100% better, more energy, no CP, palpit, SOB, edema, etc... He says that DrC at [facility] has reset his dry wt & adjusted his diuretics- on HCT 12.5 daily & he takes Lasix40 if he gains 3-4# on his scales... He is c/o a tender knot in the lower part of his chest- and exam shows this is his Xyphoid process (he couldn't feel it when he was 35# heavier)... He has had incr trouble w/ his knees & received series of 3 shots, using a walker at present, but says he's not a surg candidate (this could be reassessed in light of his wt loss & improved CV status)...     He was seen 3/14 by DrCroitoru> improved and no changes made to regimen... We reviewed prob list, meds, xrays and labs> see below for updates >>  LABS 7/14:  FLP- at goals on Simva80 & tol well;  Chems- wnl w/ Cr=1.0.Marland KitchenMarland Kitchen  ~  November 08, 2013:  6-61mo ROV & post hosp check> Todd Kelly was hosp 10/23 - 08/14/13 by [facility] w/ incr SOB, orthop & PND + edema; Dx w/ ac on chr sysCHF (2DEcho w/ globalHK, inferiorAK, EF=30-35%)- he was diuresed and disch on Lasix40, sodium restriction, daily wts etc; he had AFib & [facility] started Amio- converted to NSR; underlying COPD- no change in therapy this adm... He had f/u DrCroitoru 11/14> PAF, Pacemaker for  CHB placed in 2009- on [place]; CAD- s/p CABG, CHF w/ dry wt ~190-195 & instructed to take extra Lasix & KCl if wt >195#...    COPD> on Pred5mg /d, Symbicort160-2spBid, Spiriva daily, Singulair10, & Mucinex; breathing improved- min wheezing w/ exertion, mild cough, beige sput, DOE w/o change, no edema; [facility] & he is asked to walk on the other days...    HBP> on Lopressor25Bid, Cozaar100, Lasix40Qd now, KCl20/d ; BP= 122/70 & similar at home; denies CP, palpit, syncope,  ch in SOB, edema...    CAD, s/p CABG, Cardiomyopathy w/ EF=30-35%> on ASA + the above; denies angina & only [facility] he says; CHF improved after vigorous diuresis by DrCroitoru now on Lasix40 w/ extra40 if wt>195#...    AFib, Pacer> on Amio200, Xarelto20 + the above; stable w/o bleeding problems etc; DrC mentioned low AF burden & may be able to stop Amio...    Cerebrovasc dis> on ASA & Xarelto as noted; followed by [facility]; pt states last CDoppler was 10/11 & reported "OK", he denies cerebral ischemic symptoms.    ASPVD> on ASA & Xarelto as noted; followed by [facility] w/ prev right fem art thrombectomy & right RA stent...    Chol> on Atorva40, FishOil, CoQ10; FLP 1/15 shows TChol 108, TG 70, HDL 39, LDL 55; needs same med & great job w/ wt reduction...    Obesity> weight= 199# stable; great job w/ 50# wt loss on diet & exercise...    GI> Divertics, Polyps, Hems> off Prilosec & on Ranitadine 150mg Bid; on Miralax, Colase, AnusolHC; obese protuberant abd; denies pain, N/ V/ D/ C/ blood etc...    DJD, LBP> on Vicodin & averages 1-2 daily...    Anxiety> on Xanax and averages 1/2 tab bid...    Anemia> w/ Fe defic & B12 defic discovered on labs; pt to start FeSO4 daily (and refer to GI- DrStark) & B12 1070mcg daily w/ f/u labs in several months... We reviewed prob list, meds, xrays and labs> see below for updates >>  CXR 1/15 showed sl cardiomeg, s/p CABG, pacer on left, chr lung  changes, resolution of prev right effusion, NAD... LABS 1/15:  FLP- at goals on Lip40;  Chems- wnl w/ Cr=1.4;  CBC- anemic w/ Hg=10.5 MCV=78 Fe=41 (11%);  TSH=1.96;  PSA=1.93;  B12=159 & Rec to refer to GI for heme+stool, start FeSO4 w/ VitC, and B12 1000 poQd ( recheck in several months)...          Problem List:  BRONCHITIS, CHRONIC, ACUTE EXACERBATION (ICD-491.21) >> see prev notes... COPD (ICD-496) - ex-smoker, freq flairs when off Rx... now taking: SYMBICORT 160- 2spBid,  SPIRIVA daily, SINGULAIR 10mg /d,  MUCINEX 1-2 Bid w/ fluids, + FLONASE... ~  baseline CXR shows COPD, prev median sternotomy, NAD.Marland KitchenMarland Kitchen  ~   CT CHEST 4/09 w/ extensive coronary calcif, prior CABG, no signif abn in the lungs. ~  PFT's 4/09 showed FVC=2.88 (71%), FEV1=2.13 (67%), FEV1/ FVC= 74%, mid-flows= 51%pred... ~  CXR 3/10 showed sl cardiomeg, pleural thickening, pacer, DJD sp, NAD. ~  CXR 12/11 showed s/p CABG, pacer on left, borderline cardiomeg, basilar scarring, NAD. ~  CXR 4/12 showed s/p CABG, pacer, extrapleural fat deposition bilat, NAD.[name]. ~  CXR 1/14 showed mod cardiomeg, prev median sternotomy, pacer, clear lungs & DJD spine... ~  7/14:  Pred is down to 5mg /d & he is much improved w/ the wt reduction... ~  1/15: on Pred5mg /d, Symbicort160-2spBid, Spiriva daily, Singulair10, & Mucinex; breathing improved- min wheezing w/ exertion, mild cough, beige sput, DOE w/o change, no edema; [facility] & he is asked to walk on the other days.  OBSTRUCTIVE SLEEP APNEA (ICD-327.23) - he does not tolerate his CPAP...  HYPERTENSION (ICD-401.9) controlled on meds:  METOPROLOL 25mg Bid, COZAAR 100/d, LASIX 40mg /d; BP= 122/68 & similar at home; denies CP, palpit, syncope, ch in SOB, edema...  ARTERIOSCLEROTIC HEART DISEASE >> s/p CABG 2005 ISCHEMIC CARDIOMYOPATHY >> EF=35-40% ~  he is on ASA  81mg /d,  Now XARELTO 20mg /d, + meds as listed above... ~   CT CHEST 4/09 w/ extensive coronary calcif, prior  CABG, no signif abn in the lungs. ~  cath 4/09 showed patent grafts, some disease in Circ & RCA, patent stent to left renal art... med Rx.  ~  2DEcho 5/11 showed mod conc LVH, mod reduced LVF w/ EF= 35-40% w/ apical AK, dil RA... ~  Nuclear Stress Test 11/11 showed a mild perfusion defect inferiorly consistent w/ a scar, global LVF is reduced w/ EF= 38%, dyssynchony due to RV apical pacing... ~  Followed by [facility], DrCroitoru> notes reviewd... ~  Adm 10/14 by [facility] w/ ac on chr sysCHF w/ 2DEcho showing globalHK, inferiorAK, EF=30-35%, diuresed & meds adjusted...  ATRIAL FIBRILLATION & Tachy-Brady Syndrome >> COMPLETE HEART BLOCK & CARDIAC PACEMAKER IN SITU >> ~  he had syncope w/ 4 fx right ribs and CHB & AFib requiring pacer & Coumadin started... f/u by DrSolomon [facility] + DrCroitoru... f/u pacer checks OK & no changes made... ~  Switched to Xarelto by [facility] and Amio200mg /d added 10/14 hosp...  CEREBROVASCULAR DISEASE (ICD-437.9) - S/P Bilat CAE's 2000 & doppler's are followed by DrBerry...  ~  last CDoppler reported by [facility] 4/09 showed mod distal right CCA stenosis. ~  pt reports CDopplers done after 10/11 OV w/ DrSolomon was "OK" ~  DrC's note of 4/13 indicates last CDoppler w/ 50-69% restenosis on the right... ~  CDopplers 6/14 showed 50-69% right stenosis in the bulb & left wqas open & patent....  PERIPHERAL VASCULAR DISEASE (ICD-443.9) - S/P Rt fem art thrombectomy 2000, and Rt renal art stent 2002... he has all this followed by [facility] now & last doppler & cath showed patent stent.  HYPERCHOLESTEROLEMIA (ICD-272.0) - controlled on SIMVASTATIN 80mg /d & Fish Oil and labs followed by DrBerry (pt will ask for copies to be sent to [place])... he recently added NIASPAN for HDL of 31, but this was stopped due to flushing/ intol...  ~  [place] 12/09 showed TChol 138, TG 220, HDL 34, LDL 60 ~  FLP 12/10 showed TChol 132, TG 253, HDL 33, LDL 61 ~  FLP 12/11 showed TChol 144, TG 200, HDL 33, LDL 71... advised low  fat diet & get weight down ~  FLP 12/12 on Simva80 showed  TChol 156, TG 195, HDL 42, LDL 75; needs better low fat diet & wt reduction... ~  FLP 1/15 on Lip40 showed TChol 108, TG 70, HDL 39, LDL 55  OBESITY (ICD-278.00) - weight fluctuates betw 240-250# & he has been unable to lose weight... he is sedentary and we discussed an exercise program for him, since he is limited by LBP. ~  NOTE:  labs 12/09 showed BS= 131, HgA1c= 6.1.[name]. advised low carb diet, get wt down! ~  6/10: wt today= 249#, but pt states down to 233# at home- "just decr portions" ~  12/10:  wt today= 252# post holiday... he states "I'm starving myself to death"... ~  2023-04-21:  weight = 252#.[name]. states he's lost 4" at the waist... ~  12/11:  weight = 252# ~  4/12:  Weight = 256# ~  6/12:  Weight = 262# ~  12/12:  Weight = 262# ~  6/13:  Weight = 246# ~  7/14:  Weight = 211# ~  1/15:  Weight = 199#  DIVERTICULOSIS OF COLON (ICD-562.10) COLONIC POLYPS (ICD-211.3) HEMORRHOIDS (ICD-455.6) ~  last colonoscopy was 12/07 by DrStark and showed divertic, polyps 2-53mm size (adenomas),  and hems... f/u planned 55yrs. ~  CT Abd&Pelvis 7/10 showed several sm gallstones in gallbladder, bilat renal cysts, 1.7cm left adrenal adenoma & 42mm right adrenal lesion, advanced atherosclerotic changes in distalAo & branches, sm bilat inguinal hernias containing fat; DDD & old healed right rib fx.  DEGENERATIVE JOINT DISEASE (ICD-715.90) LOW BACK PAIN, CHRONIC (ICD-724.2) ~  he uses DCN 100 for pain and prev requested refill of #360 for 3 month supply... ~  12/10: DCN now off the market & Rx for Vicodin #270- up to 3/d as needed for pain. ~  12/12:  He is c/o incr back pain & he doesn't want to f/u w/ DrApling  ANXIETY (ICD-300.00) - he uses ALPRAZOLAM 0.5mg  Tid and prev requested #270 for 3 month supply...  ANEMIA >> Low Fe w/ heme pos stools and B12 defic... ~  Labs 1/15 showed Hg= Hg=10.5 MCV=78 Fe=41 (11%); B12=159 & Rec to refer to GI for  heme+stool, start FeSO4 w/ VitC, and B12 1000 poQd ( recheck in several months).   Past Surgical History  Procedure Laterality Date  . Pta to right common femoral artery  2001    Dr. Lyndel Safe  . Carotid endarterectomy  2002    bilateral.  Dr. Kellie Simmering  . Coronary artery bypass graft  2005  . Pacemaker placement  July 2009    for CHB  Dr. Gwenlyn Found. [employer] [facility]  . Appendectomy  1948  . US echocardiography  11/06/2011    Mod. LVH w/mod. depressed systolic function,EF 71-69%,CVELF LV hypokinesis,mildly dilated LA,mild aortic root dilatation  . Nm myoview ltd  09/04/2010    mild perfusion defect basal inferior,mid inferior, & apical inferior regions. LV systolic function deteriorated since 2006  . Cardiac catheterization  02/02/2008    patent grafts    Outpatient Encounter Prescriptions as of 11/08/2013  Medication Sig  . ALPRAZolam (XANAX) 0.5 MG tablet Take 1 tablet (0.5 mg total) by mouth 3 (three) times daily as needed for anxiety. Not to exceed 3 per day.  [name] amiodarone (PACERONE) 200 MG tablet Take 1 tablet (200 mg total) by mouth daily.  [name] aspirin 81 MG tablet Take 81 mg by mouth daily.    [name] atorvastatin (LIPITOR) 40 MG tablet Take 1 tablet (40 mg total) by mouth daily.  . budesonide-formoterol (SYMBICORT) 160-4.5 MCG/ACT inhaler Inhale 2 puffs into the lungs 2 (two) times daily.  . Coenzyme Q10 (COQ10) 100 MG CAPS Take 100 mg by mouth daily.   [name] diltiazem (CARDIZEM CD) 180 MG 24 hr capsule Take 1 capsule (180 mg total) by mouth daily.  [name] docusate sodium (COLACE) 100 MG capsule Take 200 mg by mouth daily.   . fluticasone (FLONASE) 50 MCG/ACT nasal spray Place 1 spray into the nose 2 (two) times daily.  . furosemide (LASIX) 40 MG tablet Take 1 tablet (40 mg total) by mouth daily.  [name] guaiFENesin (MUCINEX) 600 MG 12 hr tablet Take 2 tablets (1,200 mg total) by mouth 2 (two) times daily as needed for congestion.  [name] HYDROcodone-acetaminophen (NORCO/VICODIN) 5-325 MG per tablet Take 1/2 to 1 tablet by  mouth three times daily as needed for pain  . losartan (COZAAR) 100 MG tablet Take 1 tablet (100 mg total) by mouth at bedtime.  . meclizine (ANTIVERT) 25 MG tablet Take 1/2 to 1 tablet by mouth every 6 hours as needed for dizziness  . metoprolol tartrate (LOPRESSOR) 25 MG tablet Take 1 tablet (25 mg total) by mouth 2 (two) times daily.  . montelukast (SINGULAIR)  10 MG tablet TAKE 1 TABLET AT BEDTIME  . Omega-3 Fatty Acids (FISH OIL) 1000 MG CAPS Take 1 capsule by mouth daily.    . polyethylene glycol (MIRALAX / GLYCOLAX) packet Take 17 g by mouth daily as needed.   . potassium chloride SA (K-DUR,KLOR-CON) 20 MEQ tablet Take 1 tablet (20 mEq total) by mouth daily.  . predniSONE (DELTASONE) 5 MG tablet Take 5 mg by mouth daily.   . ranitidine (ZANTAC) 150 MG capsule Take 1 capsule (150 mg total) by mouth 2 (two) times daily.  . Rivaroxaban (XARELTO) 20 MG TABS tablet Take 1 tablet (20 mg total) by mouth daily with supper.  [name] SPIRIVA HANDIHALER 18 MCG inhalation capsule INHALE THE CONTENTS OF 1 CAPSULE DAILY  . vitamin C (ASCORBIC ACID) 500 MG tablet Take 500 mg by mouth daily.    Allergies  Allergen Reactions  . Penicillins Anaphylaxis and Hives    REACTION: Allergic to PCN w/ throat swelling \\T \ hives    Current Medications, Allergies, Past Medical History, Past Surgical History, Family History, and Social History were reviewed in [employer] record.    Review of Systems       See HPI - all other systems neg except as noted...      The patient complains of dyspnea on exertion.  The patient denies anorexia, fever, weight loss, weight gain, vision loss, decreased hearing, hoarseness, chest pain, syncope, peripheral edema, prolonged cough, headaches, hemoptysis, abdominal pain, melena, hematochezia, severe indigestion/heartburn, hematuria, incontinence, muscle weakness, suspicious skin lesions, transient blindness, difficulty walking, depression, unusual weight change,  abnormal bleeding, enlarged lymph nodes, and angioedema.     Objective:   Physical Exam     WD, Obese, 80 y/o WM in NAD... GENERAL:  Alert & oriented; pleasant & cooperative... HEENT:  [place]/AT, EOM-full, PERRLA, TMs- distal wax blocking left TM, NOSE-clear, THROAT- sl red w/o exud... NECK:  Supple w/ fair ROM; no JVD; s/p bilat CAE's w/ bruits; no thyromegaly or nodules palpated; no lymphadenopathy. CHEST:  decr BS bilat w/ few scat rhonchi, no rales, no signs of consolidation... HEART:  Regular Rhythm; without murmurs/ rubs/ or gallops- median sternotomy scar...pacemaker in place ABDOMEN:  Obese, soft & nontender; normal bowel sounds; no organomegaly or masses detected. EXT: warm and dry, mild arthritic changes; no varicose veins/ +venous insuffic/ no edema. NEURO:  CN's intact; no focal neuro deficits...  DERM: no lesions seen, no rash etc...  RADIOLOGY DATA:  Reviewed in the EPIC EMR & discussed w/ the patient...  LABORATORY DATA:  Reviewed in the EPIC EMR & discussed w/ the patient...   Assessment & Plan:    COPD/ Chr Bronchitis>  Improved w/ the Pred at 5mg  Qd; continue this and his other meds: Symbicort, Spiriva, Singulair, Mucinex, etc...  HBP, CHF w/ combined sys & dias>  Fair control on meds, adjusted recently by [facility] w/ incr Lasix if wt>195#, continue same for now...  ASHD/ AFib/ Pacer>  Followed by DrCroitoru [facility] & stable; on Amio & Xarelto...  Cerebrovasc & Peripheral vasc dis>  Also followed regularly by [facility] w/ CDopplers as noted...  CHOL>  He on Lip40 from [facility] [place] shows improvement w/ all parameters wnl...  Obesity>  Nice job w/ wt reduction...  DJD>  Feeling better w/ the Pred, uses Vicodin as needed...  Anxiety>  Stable on Alpraz Prn...  ANEMIA> [id number]- Hg=10.5 MCV=78 Fe=41 (11%);  B12=159 & Rec to refer to GI for heme+stool, start FeSO4 w/ VitC, and B12 1000 poQd (  recheck in several months).   Patient's Medications  New Prescriptions   No medications  on file  Previous Medications   AMIODARONE (PACERONE) 200 MG TABLET    Take 1 tablet (200 mg total) by mouth daily.   ASPIRIN 81 MG TABLET    Take 81 mg by mouth daily.     ATORVASTATIN (LIPITOR) 40 MG TABLET    Take 1 tablet (40 mg total) by mouth daily.   COENZYME Q10 (COQ10) 100 MG CAPS    Take 100 mg by mouth daily.    DILTIAZEM (CARDIZEM CD) 180 MG 24 HR CAPSULE    Take 1 capsule (180 mg total) by mouth daily.   DOCUSATE SODIUM (COLACE) 100 MG CAPSULE    Take 200 mg by mouth daily.    FERROUS SULFATE 325 (65 FE) MG TABLET    Take 325 mg by mouth daily with breakfast.   FUROSEMIDE (LASIX) 40 MG TABLET    Take 1 tablet (40 mg total) by mouth daily.   GUAIFENESIN (MUCINEX) 600 MG 12 HR TABLET    Take 2 tablets (1,200 mg total) by mouth 2 (two) times daily as needed for congestion.   LOSARTAN (COZAAR) 100 MG TABLET    Take 1 tablet (100 mg total) by mouth at bedtime.   METOPROLOL TARTRATE (LOPRESSOR) 25 MG TABLET    Take 1 tablet (25 mg total) by mouth 2 (two) times daily.   OMEGA-3 FATTY ACIDS (FISH OIL) 1000 MG CAPS    Take 1 capsule by mouth daily.     POLYETHYLENE GLYCOL (MIRALAX / GLYCOLAX) PACKET    Take 17 g by mouth daily as needed.    POTASSIUM CHLORIDE SA (K-DUR,KLOR-CON) 20 MEQ TABLET    Take 1 tablet (20 mEq total) by mouth daily.   PREDNISONE (DELTASONE) 5 MG TABLET    Take 5 mg by mouth daily.    RANITIDINE (ZANTAC) 150 MG CAPSULE    Take 1 capsule (150 mg total) by mouth 2 (two) times daily.   RIVAROXABAN (XARELTO) 20 MG TABS TABLET    Take 1 tablet (20 mg total) by mouth daily with supper.   VITAMIN C (ASCORBIC ACID) 500 MG TABLET    Take 500 mg by mouth daily.  Modified Medications   Modified Medication Previous Medication   ALPRAZOLAM (XANAX) 0.5 MG TABLET ALPRAZolam (XANAX) 0.5 MG tablet      Take 1 tablet (0.5 mg total) by mouth 3 (three) times daily as needed for anxiety. Not to exceed 3 per day.    Take 1 tablet (0.5 mg total) by mouth 3 (three) times daily as needed for  anxiety. Not to exceed 3 per day.   BUDESONIDE-FORMOTEROL (SYMBICORT) 160-4.5 MCG/ACT INHALER budesonide-formoterol (SYMBICORT) 160-4.5 MCG/ACT inhaler      Inhale 2 puffs into the lungs 2 (two) times daily.    Inhale 2 puffs into the lungs 2 (two) times daily.   FLUTICASONE (FLONASE) 50 MCG/ACT NASAL SPRAY fluticasone (FLONASE) 50 MCG/ACT nasal spray      Place 1 spray into both nostrils 2 (two) times daily.    Place 1 spray into the nose 2 (two) times daily.   HYDROCODONE-ACETAMINOPHEN (NORCO/VICODIN) 5-325 MG PER TABLET HYDROcodone-acetaminophen (NORCO/VICODIN) 5-325 MG per tablet      Take 1/2 to 1 tablet by mouth three times daily as needed for pain    Take 1/2 to 1 tablet by mouth three times daily as needed for pain   MECLIZINE (ANTIVERT) 25 MG TABLET meclizine (ANTIVERT)  25 MG tablet      Take 1/2 to 1 tablet by mouth every 6 hours as needed for dizziness    Take 1/2 to 1 tablet by mouth every 6 hours as needed for dizziness   MONTELUKAST (SINGULAIR) 10 MG TABLET montelukast (SINGULAIR) 10 MG tablet      TAKE 1 TABLET AT BEDTIME    TAKE 1 TABLET AT BEDTIME   TIOTROPIUM (SPIRIVA HANDIHALER) 18 MCG INHALATION CAPSULE SPIRIVA HANDIHALER 18 MCG inhalation capsule      INHALE THE CONTENTS OF 1 CAPSULE DAILY    INHALE THE CONTENTS OF 1 CAPSULE DAILY  Discontinued Medications   No medications on file

## 2013-11-09 ENCOUNTER — Telehealth: Payer: Self-pay | Admitting: Pulmonary Disease

## 2013-11-09 NOTE — Telephone Encounter (Signed)
Notes Recorded by Noralee Space, MD on 11/09/2013 at 8:55 AM Please notify patient>  Both IRON & B12 levels are low... Rec- NEEDS ORAL Fe 325mg  daily & ORAL B12- 1056mcg/d (get these OTC & take them everyday!) We will recheck at next ROV in 2-33mo... Notes Recorded by Noralee Space, MD on 11/09/2013 at 8:47 AM Please notify patient>  CXR shows sl cardiomeg,s/pCABG & pacer, chronic lung changes but NAD.[name] Prev pleural effusions have resolved... Continue current meds...   I spoke with patient about results and he verbalized understanding and had no questions.

## 2013-11-27 ENCOUNTER — Telehealth: Payer: Self-pay | Admitting: Pulmonary Disease

## 2013-11-27 MED ORDER — HYDROCODONE-ACETAMINOPHEN 5-325 MG PO TABS: ORAL_TABLET | ORAL | Status: DC

## 2013-11-27 NOTE — Telephone Encounter (Signed)
rx has been printed out and signed by SN.  i have called the pts wife and she will be by tomorrow to pick this up.

## 2013-11-27 NOTE — Telephone Encounter (Signed)
Last OV 11/08/13 Pending OV 01/10/14 Last fill of Hydrocodone 10/23/13 #90  SN - please advise on refill. Thanks.

## 2013-11-29 ENCOUNTER — Ambulatory Visit: Payer: Medicare Other | Admitting: Gastroenterology

## 2013-12-12 ENCOUNTER — Ambulatory Visit: Payer: Medicare Other | Admitting: Cardiovascular Disease

## 2013-12-19 ENCOUNTER — Ambulatory Visit: Payer: Medicare Other | Admitting: Gastroenterology

## 2013-12-24 ENCOUNTER — Telehealth: Payer: Self-pay | Admitting: Pulmonary Disease

## 2013-12-24 DIAGNOSIS — D509 Iron deficiency anemia, unspecified: Secondary | ICD-10-CM

## 2013-12-24 NOTE — Telephone Encounter (Signed)
ATC pt at work, was told she has gone for the day.  Called patient at home Spoke with patient and advised of recs below about fasting labs not needing to be done at next ov.  Per pt's spouse, she was not asking about fasting labs, but about repeating pt's IBC panel.  Per the 1.22.15 labs:  Result Notes    Notes Recorded by Inge Rise, CMA on 11/09/2013 at 5:14 PM See phone note 11/09/13 ------  Notes Recorded by Elie Confer, CMA on 11/09/2013 at 5:02 PM [email or web address] ------  Notes Recorded by Noralee Space, MD on 11/09/2013 at 8:55 AM Please notify patient>  Both IRON & B12 levels are low... Rec- NEEDS ORAL Fe 325mg  daily & ORAL B12- 1028mcg/d (get these OTC & take them everyday!) We will recheck at next ROV in 2-61mo...   Dr Lenna Gilford please advise, thanks.

## 2013-12-24 NOTE — Telephone Encounter (Signed)
Pt will not need to have fasting labs since these were done in jan 2015.  thanks

## 2013-12-24 NOTE — Telephone Encounter (Signed)
Pt had visit 11/08/13. Pending appt 01/10/14. Please advise SN thanks

## 2013-12-25 NOTE — Telephone Encounter (Signed)
lmtcb x1 

## 2013-12-25 NOTE — Telephone Encounter (Signed)
Patient calling back.  Informed patient lab orders have been placed.  Nothing else needed.  [facility]

## 2013-12-25 NOTE — Telephone Encounter (Signed)
Per SN----  Ok to do labs a few days prior to his appt on 3/26.  Labs have been placed in the computer for the pt.  thanks

## 2013-12-28 ENCOUNTER — Ambulatory Visit (INDEPENDENT_AMBULATORY_CARE_PROVIDER_SITE_OTHER): Payer: 59 | Admitting: Cardiovascular Disease

## 2013-12-28 ENCOUNTER — Encounter: Payer: Self-pay | Admitting: Cardiovascular Disease

## 2013-12-28 ENCOUNTER — Telehealth: Payer: Self-pay | Admitting: Pulmonary Disease

## 2013-12-28 VITALS — BP 138/70 | HR 60 | Ht 69.0 in | Wt 196.9 lb

## 2013-12-28 DIAGNOSIS — I2589 Other forms of chronic ischemic heart disease: Secondary | ICD-10-CM

## 2013-12-28 DIAGNOSIS — I442 Atrioventricular block, complete: Secondary | ICD-10-CM

## 2013-12-28 DIAGNOSIS — Z950 Presence of cardiac pacemaker: Secondary | ICD-10-CM

## 2013-12-28 DIAGNOSIS — I4891 Unspecified atrial fibrillation: Secondary | ICD-10-CM

## 2013-12-28 DIAGNOSIS — E7800 Pure hypercholesterolemia, unspecified: Secondary | ICD-10-CM

## 2013-12-28 DIAGNOSIS — I255 Ischemic cardiomyopathy: Secondary | ICD-10-CM

## 2013-12-28 DIAGNOSIS — I2510 Atherosclerotic heart disease of native coronary artery without angina pectoris: Secondary | ICD-10-CM

## 2013-12-28 DIAGNOSIS — I739 Peripheral vascular disease, unspecified: Secondary | ICD-10-CM

## 2013-12-28 LAB — PACEMAKER DEVICE OBSERVATION

## 2013-12-28 NOTE — Assessment & Plan Note (Signed)
Atrial fibrillation seems to have been part of his heart failure decompensation, but retrospectively one wonders whether the anemia was also an important factor. Will require periodic liver function tests and thyroid function tests while on amiodarone therapy. The benefit of permanent anticoagulation needs to be weighed against the risk of bleeding and anemia. At least from a clinical standpoint his anemia seems to be markedly improved.

## 2013-12-28 NOTE — Telephone Encounter (Signed)
Spoke with spouse. Aware SN is out until Monday. Pt would like to pick up norco RX at that time. Pt last had this refilled 11/27/13 #90 x 0 refills Please advise SN thanks

## 2013-12-28 NOTE — Assessment & Plan Note (Signed)
Excellent recent lipid profile

## 2013-12-28 NOTE — Assessment & Plan Note (Signed)
CABG 2005, Dr. Servando Snare, LIMA to LAD, SVG to intermediate, SVG to second intermedius. Angiography 2009 shows all grafts patent, nondominant right coronary artery sequential moderate stenoses in the AV groove portion left circumflex coronary artery. Currently asymptomatic. No change in therapy planned.

## 2013-12-28 NOTE — Assessment & Plan Note (Signed)
Normal device function. Continue with checks in office every 3 months. He could well benefit from upgrade of his device to a biventricular pacemaker (CRT-P), but prefers nonsurgical management as long as he feels well.

## 2013-12-28 NOTE — Patient Instructions (Addendum)
Your physician recommends that you schedule a follow-up appointment in: 3 months with Dr.Croitoru + pacemaker check

## 2013-12-28 NOTE — Assessment & Plan Note (Signed)
Denies intermittent claudication or any neurological symptoms

## 2013-12-28 NOTE — Progress Notes (Signed)
Patient ID: Todd Kelly, male   DOB: 1931-06-24, 80 y.o.   MRN: [id number]      Reason for office visit CHF, pacemaker followup (complete heart block), paroxysmal atrial fibrillation  Todd Kelly has a lengthy and complicated cardiac history including coronary disease (coronary bypass surgery 2005, last cardiac cath 2009, low risk nuclear stress test 2011), moderate ischemic cardiomyopathy with a left ventricular ejection fraction of 35%, complete heart block status post dual-chamber pacemaker implantation (2009, [employer]), congestive heart failure with episode of acute exacerbation roughly 6 months ago, current paroxysmal atrial fibrillation.   He was recently diagnosed with iron deficiency anemia which appears to be in improving as he is receiving iron supplements. Dr. Lenna Kelly has encouraged him to undergo colonoscopy since he has Hemoccult-positive stools, but Todd Kelly is very reluctant. He is afraid of complications of the colonoscopy and states that under no circumstances would he undergo surgery unit he was diagnosed with colon cancer. 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 He has NYHA functional class 2-3 status. He denies recent wheezing and has not had edema. He does not have orthopnea or PND. Feels best when his weight is around 190 pounds and takes torsemide only when his weight exceeds 193 pounds. Our office scale appears to overestimate his home weight by about 6-7 pounds (today at home he weighed 190 pounds).  Interrogation of his pacemaker shows no evidence of recent atrial fibrillation. He has had 3 very brief episodes of atrial tachycardia lasting less than 6 seconds each since his last device check.   Allergies  Allergen Reactions  . Penicillins Anaphylaxis and Hives     REACTION: Allergic to PCN w/ throat swelling \\T \ hives    Current Outpatient Prescriptions  Medication Sig Dispense Refill  . ALPRAZolam (XANAX) 0.5 MG tablet Take 1 tablet (0.5 mg total) by mouth 3 (three) times daily as needed for anxiety. Not to exceed 3 per day.  270 tablet  1  . amiodarone (PACERONE) 200 MG tablet Take 1 tablet (200 mg total) by mouth daily.  90 tablet  3  . aspirin 81 MG tablet Take 81 mg by mouth daily.        [name] atorvastatin (LIPITOR) 40 MG tablet Take 1 tablet (40 mg total) by mouth daily.  90 tablet  3  . budesonide-formoterol (SYMBICORT) 160-4.5 MCG/ACT inhaler Inhale 2 puffs into the lungs 2 (two) times daily.  3 Inhaler  3  . Coenzyme Q10 (COQ10) 100 MG CAPS Take 100 mg by mouth daily.       [name] docusate sodium (COLACE) 100 MG capsule Take 200 mg by mouth daily.       . ferrous sulfate 325 (65 FE) MG tablet Take 325 mg by mouth daily with breakfast.      . fluticasone (FLONASE) 50 MCG/ACT nasal spray Place 1 spray into both nostrils 2 (two) times daily.  48 g  3  . furosemide (LASIX) 40 MG tablet Take 1 tablet (40 mg total) by mouth daily.  90 tablet  3  . guaiFENesin (MUCINEX) 600 MG 12 hr tablet Take 2 tablets (1,200 mg total) by mouth 2 (two) times daily as needed for congestion.      [name] HYDROcodone-acetaminophen (NORCO/VICODIN) 5-325 MG per tablet Take 1/2 to 1 tablet by mouth  three times daily as needed for pain  90 tablet  0  . losartan (COZAAR) 100 MG tablet Take 1 tablet (100 mg total) by mouth at bedtime.  90 tablet  3  . meclizine (ANTIVERT) 25 MG tablet Take 1/2 to 1 tablet by mouth every 6 hours as needed for dizziness  90 tablet  3  . metoprolol tartrate (LOPRESSOR) 25 MG tablet Take 1 tablet (25 mg total) by mouth 2 (two) times daily.  90 tablet  3  . montelukast (SINGULAIR) 10 MG tablet TAKE 1 TABLET AT BEDTIME  90 tablet  3  . Omega-3 Fatty Acids (FISH OIL) 1000 MG CAPS Take 1 capsule by mouth daily.        . polyethylene glycol (MIRALAX / GLYCOLAX)  packet Take 17 g by mouth daily as needed.       . potassium chloride SA (K-DUR,KLOR-CON) 20 MEQ tablet Take 1 tablet (20 mEq total) by mouth daily.  90 tablet  3  . predniSONE (DELTASONE) 5 MG tablet Take 5 mg by mouth daily.       . ranitidine (ZANTAC) 150 MG capsule Take 1 capsule (150 mg total) by mouth 2 (two) times daily.  180 capsule  3  . Rivaroxaban (XARELTO) 20 MG TABS tablet Take 1 tablet (20 mg total) by mouth daily with supper.  90 tablet  3  . tiotropium (SPIRIVA HANDIHALER) 18 MCG inhalation capsule INHALE THE CONTENTS OF 1 CAPSULE DAILY  90 capsule  3  . vitamin C (ASCORBIC ACID) 500 MG tablet Take 500 mg by mouth daily.       No current facility-administered medications for this visit.   Facility-Administered Medications Ordered in Other Visits  Medication Dose Route Frequency Provider Last Rate Last Dose  . 0.9 %  sodium chloride infusion    Continuous PRN Durene Romans, CRNA        Past Medical History  Diagnosis Date  . OSA (obstructive sleep apnea)   . Bronchial pneumonia   . Bronchitis, chronic with acute exacerbation   . COPD (chronic obstructive pulmonary disease)   . Hypertension   . Arteriosclerotic heart disease   . Atrial fibrillation   . Cardiac pacemaker in situ 04/12/2008    [facility]  . Cerebrovascular disease   . Peripheral vascular disease   . Hypercholesteremia   . Obesity   . Diverticulosis of colon   . Adenomatous polyp of colon 08/1994  . Hemorrhoids   . DJD (degenerative joint disease)   . Chronic low back pain   . Anxiety   . CHF (congestive heart failure)   . CHB (complete heart block)   . Carotid arterial disease   . CAD (coronary artery disease)   . S/P CABG (coronary artery bypass graft) 2005    Past Surgical History  Procedure Laterality Date  . Pta to right common femoral artery  2001    Dr. Lyndel Safe  . Carotid endarterectomy  2002    bilateral.  Dr. Kellie Simmering  . Coronary artery bypass graft  2005  . Pacemaker  placement  July 2009    for CHB  Dr. Gwenlyn Found. [employer]  . Appendectomy  1948  . US echocardiography  11/06/2011    Mod. LVH w/mod. depressed systolic function,EF 09-73%,ZHGDJ LV hypokinesis,mildly dilated LA,mild aortic root dilatation  . Nm myoview ltd  09/04/2010    mild perfusion defect basal inferior,mid inferior, & apical inferior regions. LV systolic function deteriorated since 2006  . Cardiac catheterization  02/02/2008    patent grafts    Family History  Problem Relation Age of Onset  . Asthma Paternal Uncle   . Heart disease Paternal Grandmother   . Heart disease Mother   . Heart attack Father   . Rheum arthritis Father   . Rheum arthritis Paternal Uncle   . Pancreatic cancer Mother   . Hyperlipidemia Mother     History   Social History  . Marital Status: Married    Spouse Name: Enid Derry    Number of Children: N  . Years of Education: N/A   Occupational History  . [employer]     retired  . DRIVER    Social History Main Topics  . Smoking status: Former Smoker -- 1.50 packs/day for 54 years    Types: Cigarettes    Quit date: 10/18/1996  . Smokeless tobacco: Former Systems developer    Types: Chew     Comment: only used chewing tobacco while working in the yard a couple of times  . Alcohol Use: No  . Drug Use: No  . Sexual Activity: Not on file   Other Topics Concern  . Not on file   Social History Narrative   No alcohol.   Drinks 1 cup of caffeine.    Review of systems: The patient specifically denies any chest pain at rest or with usual exertion, dyspnea at rest or with exertion, orthopnea, paroxysmal nocturnal dyspnea, syncope, palpitations, focal neurological deficits, intermittent claudication, lower extremity edema, unexplained weight gain, cough, hemoptysis or wheezing.  The patient also denies abdominal pain, nausea, vomiting, dysphagia, diarrhea, constipation, polyuria, polydipsia, dysuria, hematuria, frequency, urgency, abnormal bleeding or  bruising, fever, chills, unexpected weight changes, mood swings, change in skin or hair texture, change in voice quality, auditory or visual problems, allergic reactions or rashes, new musculoskeletal complaints other than usual "aches and pains".   PHYSICAL EXAM BP 138/70  Pulse 60  Ht 5\' 9"  (1.753 m)  Wt 89.313 kg (196 lb 14.4 oz)  BMI 29.06 kg/m2  General: Alert, oriented x3, no distress Head: no evidence of trauma, PERRL, EOMI, no exophtalmos or lid lag, no myxedema, no xanthelasma; normal ears, nose and oropharynx Neck: normal jugular venous pulsations and no hepatojugular reflux; brisk carotid pulses without delay and no carotid bruits Chest: Emphysematous chest, diminished breath sounds throughout but otherwise clear to auscultation, no signs of consolidation by percussion or palpation, normal fremitus, symmetrical and full respiratory excursions; healthy subclavian pacemaker site Cardiovascular: normal position and quality of the apical impulse, regular rhythm, normal first and paradoxically split second heart sounds, no murmurs, rubs or gallops Abdomen: no tenderness or distention, no masses by palpation, no abnormal pulsatility or arterial bruits, normal bowel sounds, no hepatosplenomegaly Extremities: no clubbing, cyanosis or edema; 2+ radial, ulnar and brachial pulses bilaterally; 2+ right femoral, posterior tibial and dorsalis pedis pulses; 2+ left femoral, posterior tibial and dorsalis pedis pulses; no subclavian or femoral bruits Neurological: grossly nonfocal   EKG: Atrial ventricular sequential pacing  Lipid Panel     Component Value Date/Time   CHOL 108 11/02/2013 0756   TRIG 70.0 11/02/2013 0756   HDL 39.30 11/02/2013 0756   CHOLHDL 3 11/02/2013 0756   VLDL 14.0 11/02/2013 0756   LDLCALC 55 11/02/2013 0756    BMET    Component Value Date/Time   NA 142 11/02/2013 0756   K 4.0 11/02/2013 0756   CL 107 11/02/2013 0756   CO2 28 11/02/2013 0756   GLUCOSE 85 11/02/2013 0756  GLUCOSE 109* 09/22/2006 1133   BUN 34* 11/02/2013 0756   CREATININE 1.4 11/02/2013 0756   CREATININE 1.19 09/14/2013 1652   CALCIUM 8.8 11/02/2013 0756   GFRNONAA 78* 08/14/2013 0600   GFRAA >90 08/14/2013 0600   Normal LFTs and TSH in January 2015  Most recent pro BNP 659 in January (substantially less than 1394 at the time of his acute exacerbation in October)  ASSESSMENT AND PLAN CAD s/p CABG '05, patent grafts 4/09, low risk Nuc 2011 CABG 2005, Dr. Servando Snare, LIMA to LAD, SVG to intermediate, SVG to second intermedius. Angiography 2009 shows all grafts patent, nondominant right coronary artery sequential moderate stenoses in the AV groove portion left circumflex coronary artery. Currently asymptomatic. No change in therapy planned.  Cardiomyopathy, ischemic- EF 35% Currently NYHA functional class II. Dry weight seems to be around 190 pounds (home scale)and he is doing a good job dosing his diuretic. We clarified the fact that he does not need to take potassium on days that he does not take his diuretic. Continue sodium restriction and daily weights. He is on appropriate treatment with a full dose of angiotensin receptor blocker, I would not increase the beta blocker dose because of his reactive airway disease.  Complete heart block    Cardiac pacemaker in situ- [employer] for CHB 7/09- pacer dependednt Normal device function. Continue with checks in office every 3 months. He could well benefit from upgrade of his device to a biventricular pacemaker (CRT-P), but prefers nonsurgical management as long as he feels well.  PAF- Amiodarone added 10/14 Atrial fibrillation seems to have been part of his heart failure decompensation, but retrospectively one wonders whether the anemia was also an important factor. Will require periodic liver function tests and thyroid function tests while on amiodarone therapy. The benefit of permanent anticoagulation needs to be weighed against the risk of bleeding  and anemia. At least from a clinical standpoint his anemia seems to be markedly improved.  HYPERCHOLESTEROLEMIA Excellent recent lipid profile  PVD - CEA '02, RCFA PTA '01 Denies intermittent claudication or any neurological symptoms   Orders Placed This Encounter  Procedures  . EKG 12-Lead   No orders of the defined types were placed in this encounter.    Holli Humbles, MD, [place] [phone] office [phone] pager

## 2013-12-28 NOTE — Assessment & Plan Note (Signed)
Currently NYHA functional class II. Dry weight seems to be around 190 pounds (home scale)and he is doing a good job dosing his diuretic. We clarified the fact that he does not need to take potassium on days that he does not take his diuretic. Continue sodium restriction and daily weights. He is on appropriate treatment with a full dose of angiotensin receptor blocker, I would not increase the beta blocker dose because of his reactive airway disease.

## 2013-12-31 ENCOUNTER — Other Ambulatory Visit: Payer: Self-pay | Admitting: Pulmonary Disease

## 2013-12-31 DIAGNOSIS — F411 Generalized anxiety disorder: Secondary | ICD-10-CM

## 2013-12-31 DIAGNOSIS — N320 Bladder-neck obstruction: Secondary | ICD-10-CM

## 2013-12-31 DIAGNOSIS — E7800 Pure hypercholesterolemia, unspecified: Secondary | ICD-10-CM

## 2013-12-31 DIAGNOSIS — D509 Iron deficiency anemia, unspecified: Secondary | ICD-10-CM

## 2013-12-31 DIAGNOSIS — I10 Essential (primary) hypertension: Secondary | ICD-10-CM

## 2013-12-31 LAB — MDC_IDC_ENUM_SESS_TYPE_INCLINIC
Battery Impedance: 1400 Ohm
Battery Voltage: 2.76 V
Brady Statistic RA Percent Paced: 91 %
Date Time Interrogation Session: 20150313120813
Implantable Pulse Generator Serial Number: 1247203
Lead Channel Impedance Value: 365 Ohm
Lead Channel Impedance Value: 464 Ohm
Lead Channel Pacing Threshold Amplitude: 0.5 V
Lead Channel Pacing Threshold Amplitude: 0.75 V
Lead Channel Setting Pacing Amplitude: 2 V
Lead Channel Setting Sensing Sensitivity: 4 mV
MDC IDC MSMT LEADCHNL RA PACING THRESHOLD PULSEWIDTH: 0.5 ms
MDC IDC MSMT LEADCHNL RA SENSING INTR AMPL: 2.3 mV
MDC IDC MSMT LEADCHNL RV PACING THRESHOLD PULSEWIDTH: 0.5 ms
MDC IDC SET LEADCHNL RV PACING PULSEWIDTH: 0.5 ms
MDC IDC STAT BRADY RV PERCENT PACED: 97 %

## 2013-12-31 MED ORDER — HYDROCODONE-ACETAMINOPHEN 5-325 MG PO TABS: ORAL_TABLET | ORAL | Status: DC

## 2013-12-31 NOTE — Telephone Encounter (Signed)
Per SN----  Ok to refill the vicodin   Ask about his new primary care SN will still be able to see him for his pulmonary issues, but will not be able to continue with the primary care and filling of the vicodin.  thanks

## 2013-12-31 NOTE — Telephone Encounter (Signed)
Pt's wife is aware that SN will no longer be doing primary care as of April 1. Rx will be ready to be picked up later today.  I have made an appointment with Dr. Sharlene Motts at [place] on 07/03/14 at 1:00pm. Pt's wife is aware.

## 2014-01-01 ENCOUNTER — Other Ambulatory Visit (INDEPENDENT_AMBULATORY_CARE_PROVIDER_SITE_OTHER): Payer: 59

## 2014-01-01 DIAGNOSIS — D509 Iron deficiency anemia, unspecified: Secondary | ICD-10-CM

## 2014-01-01 LAB — CBC WITH DIFFERENTIAL/PLATELET
BASOS ABS: 0 10*3/uL (ref 0.0–0.1)
Basophils Relative: 0.2 % (ref 0.0–3.0)
EOS PCT: 0.5 % (ref 0.0–5.0)
Eosinophils Absolute: 0.1 10*3/uL (ref 0.0–0.7)
HEMATOCRIT: 39.3 % (ref 39.0–52.0)
Hemoglobin: 12.9 g/dL — ABNORMAL LOW (ref 13.0–17.0)
LYMPHS ABS: 1.4 10*3/uL (ref 0.7–4.0)
Lymphocytes Relative: 13.1 % (ref 12.0–46.0)
MCHC: 32.8 g/dL (ref 30.0–36.0)
MCV: 91.3 fl (ref 78.0–100.0)
MONOS PCT: 4.7 % (ref 3.0–12.0)
Monocytes Absolute: 0.5 10*3/uL (ref 0.1–1.0)
NEUTROS PCT: 81.5 % — AB (ref 43.0–77.0)
Neutro Abs: 9 10*3/uL — ABNORMAL HIGH (ref 1.4–7.7)
Platelets: 174 10*3/uL (ref 150.0–400.0)
RBC: 4.31 Mil/uL (ref 4.22–5.81)
RDW: 22.9 % — AB (ref 11.5–14.6)
WBC: 11 10*3/uL — AB (ref 4.5–10.5)

## 2014-01-01 LAB — IBC PANEL
IRON: 54 ug/dL (ref 42–165)
SATURATION RATIOS: 18.2 % — AB (ref 20.0–50.0)
TRANSFERRIN: 211.7 mg/dL — AB (ref 212.0–360.0)

## 2014-01-01 LAB — FERRITIN: Ferritin: 49.1 ng/mL (ref 22.0–322.0)

## 2014-01-01 LAB — VITAMIN B12: VITAMIN B 12: 726 pg/mL (ref 211–911)

## 2014-01-10 ENCOUNTER — Ambulatory Visit (INDEPENDENT_AMBULATORY_CARE_PROVIDER_SITE_OTHER): Payer: 59 | Admitting: Pulmonary Disease

## 2014-01-10 ENCOUNTER — Encounter: Payer: Self-pay | Admitting: Pulmonary Disease

## 2014-01-10 VITALS — BP 140/70 | HR 73 | Temp 96.7°F | Ht 68.0 in | Wt 199.6 lb

## 2014-01-10 DIAGNOSIS — J449 Chronic obstructive pulmonary disease, unspecified: Secondary | ICD-10-CM

## 2014-01-10 DIAGNOSIS — M1990 Unspecified osteoarthritis, unspecified site: Secondary | ICD-10-CM

## 2014-01-10 DIAGNOSIS — I10 Essential (primary) hypertension: Secondary | ICD-10-CM

## 2014-01-10 DIAGNOSIS — I4891 Unspecified atrial fibrillation: Secondary | ICD-10-CM

## 2014-01-10 DIAGNOSIS — Z950 Presence of cardiac pacemaker: Secondary | ICD-10-CM

## 2014-01-10 DIAGNOSIS — D509 Iron deficiency anemia, unspecified: Secondary | ICD-10-CM

## 2014-01-10 DIAGNOSIS — E7800 Pure hypercholesterolemia, unspecified: Secondary | ICD-10-CM

## 2014-01-10 DIAGNOSIS — F411 Generalized anxiety disorder: Secondary | ICD-10-CM

## 2014-01-10 DIAGNOSIS — I679 Cerebrovascular disease, unspecified: Secondary | ICD-10-CM

## 2014-01-10 DIAGNOSIS — E663 Overweight: Secondary | ICD-10-CM

## 2014-01-10 DIAGNOSIS — I2510 Atherosclerotic heart disease of native coronary artery without angina pectoris: Secondary | ICD-10-CM

## 2014-01-10 DIAGNOSIS — I442 Atrioventricular block, complete: Secondary | ICD-10-CM

## 2014-01-10 DIAGNOSIS — I739 Peripheral vascular disease, unspecified: Secondary | ICD-10-CM

## 2014-01-10 NOTE — Patient Instructions (Signed)
Today we updated your med list in our EPIC system...    Continue your current medications the same...  OK to cutr the B12 pills in half & take 1/2 tab daily...  Continue the Iron tabs (with VitC) every day...  Call for any questions...  Let's plan a follow up pulmonary visit in 4-61mo, sooner if needed for problems.Marland KitchenMarland Kitchen

## 2014-01-10 NOTE — Progress Notes (Signed)
Subjective:    Patient ID: Todd Kelly, male    DOB: 02-08-1931, 80 y.o.   MRN: [id number]  HPI 80 y/o WM here for a follow up visit... he has mult med problems including:  COPD/ AB/ ex-smoker;  HBP/ ASHD/ AFib/ Pacer- followed by DrSolomon/ [facility];  Cerebrovasc & peripheral vasc disease;  Hyperlipidemia/ Obesity;  DJD/ LBP;  Anxiety, etc...  ~  April 10, 2012:  19mo ROV & Todd Kelly has had a stable 29mo interval just c/o fatigue, lack of energy, etc; he is followed regularly for Cards by DrCroitoru- last seen 4/13 & his note is reviewed> Meds have been adjusted w/ LASIX increased then cut back down- he has lost 16+lbs w/ this diuresis & he is going to continue the Lasix40 therefore we will discontinue his HCT12.5.Marland KitchenMarland Kitchen SEE MED RECON BELOW>>    COPD> on Pred10mg -1alt w/ 1/2 qod, Symbicort160, Spiriva, Singulair; breathing is improved= min wheezing w/ exertion, mild cough, beige sput, DOE w/o change, no edema; [facility] & he is asked to walk on the other days...    HBP> on Lopressor25Bid, Cozaar100, Lasix40/d now, KCl20/d ; BP= 122/68 & similar at home; denies CP, palpit, syncope, ch in SOB, edema...    CAD, s/p CABG, Cardiomyopathy w/ EF=35-40%> on ASA + the above; denies angina & only exercise [facility] he says; CHF improved after vigorous diuresis by DrCroitoru w/ Lasix80Bid, then slowly tapered & now at 40mg /d...    AFib, Pacer> on Coumadin + the above; Coumadin per [facility]==> recently changed from Coumadin to [place] 20mg /d...    Cerebrovasc dis> on ASA & Xarelto as noted; followed by [facility]; pt states last CDoppler was 10/11 & reported "OK", he denies cerebral ischemic symptoms.    ASPVD> on ASA & Xarelto as noted; followed by [facility] w/ prev right fem art thrombectomy & right RA stent...    Chol> on Simva80, FishOil, CoQ10; FLP 12/12 shows TChol 156, TG 195, HDL 42, LDL 75; needs better low fat diet & wt reduction...    Obesity> weight= 246# which represents a  16# wt reduction over 9mo; we reviewed diet/ exercise/ wt reduction strategies; great job!!!    GI> Divertics, Polyps, Hems> off Prilosec & on Ranitadine 150mg  Bid; on Miralax, Colase, Walsh; obese protuberant abd; denies pain, N/ V/ D/ C/ blood etc...    DJD, LBP> on Vicodin & averages 1-2 daily...    Anxiety> on Xanax and averages 1/2 tab bid... We reviewed prob list, meds, xrays and labs> see below>> We do not have recent labs from [facility]...  ~  October 23, 2012:  72mo ROV & Todd Kelly has had a good interval w/o new complaints or concerns- he notes good days and bad;  He did not bring meds to the OV today... We reviewed the following medical problems during today's office visit >>     COPD> on Pred10mg -1/2 qd, Symbicort160, Spiriva, Singulair; breathing is improved= min wheezing w/ exertion, mild cough, beige sput, DOE w/o change, no edema; [facility] & he is asked to walk on the other days...    HBP> on Lopressor25Bid, Cozaar100, Lasix40Bid now, KCl20/d ; BP= 134/72 & similar at home; denies CP, palpit, syncope, ch in SOB, edema...    CAD, s/p CABG, Cardiomyopathy w/ EF=35-40%> on ASA + the above; denies angina & only exercise [facility] he says; CHF improved after vigorous diuresis by DrCroitoru w/ Lasix80Bid,  then slowly tapered to 40mg Bid...    AFib, Pacer> on Xarelto + the above; Coumadin changed to Xarelto per [facility] CC==> stable w/o bleeding problems etc...    Cerebrovasc dis> on ASA & Xarelto as noted; followed by [facility]; pt states last CDoppler was 10/11 & reported "OK", he denies cerebral ischemic symptoms.    ASPVD> on ASA & Xarelto as noted; followed by [facility] w/ prev right fem art thrombectomy & right RA stent...    Chol> on Simva80, FishOil, CoQ10; FLP 12/13 shows TChol 126, TG 177, HDL 34, LDL 57; needs better low fat diet & wt reduction...    Obesity> weight= 245# stable; we reviewed diet/ exercise/ wt reduction strategies...    GI> Divertics,  Polyps, Hems> off Prilosec & on Ranitadine 150mg Bid; on Miralax, Colase, AnusolHC; obese protuberant abd; denies pain, N/ V/ D/ C/ blood etc...    DJD, LBP> on Vicodin & averages 1-2 daily...    Anxiety> on Xanax and averages 1/2 tab bid... We reviewed prob list, meds, xrays and labs> see below for updates >> he had the 2013 Flu vaccine 11/13; requests refill Rx today...  CXR 1/14 showed mod cardiomeg, s/p CABG & pacer, clear lungs, mild DJD spine... LABS 12/13:  FLP- fair on Simva80 w/ TG=177 HDL=34;  Chems- ok x BS=125;  CBC- wnl;  TSH=1.94;  PSA=1.40  ~  April 25, 2103:  65mo ROV & Todd Kelly has done an incredible job w/ diet, exercise & wt reduction- weight down 23# to 211# today (35# over the last yr)!  Assoc w/ this he says he feels 100% better, more energy, no CP, palpit, SOB, edema, etc... He says that DrC at [facility] has reset his dry wt & adjusted his diuretics- on HCT 12.5 daily & he takes Lasix40 if he gains 3-4# on his scales... He is c/o a tender knot in the lower part of his chest- and exam shows this is his Xyphoid process (he couldn't feel it when he was 35# heavier)... He has had incr trouble w/ his knees & received series of 3 shots, using a walker at present, but says he's not a surg candidate (this could be reassessed in light of his wt loss & improved CV status)...     He was seen 3/14 by DrCroitoru> improved and no changes made to regimen... We reviewed prob list, meds, xrays and labs> see below for updates >>  LABS 7/14:  FLP- at goals on Simva80 & tol well;  Chems- wnl w/ Cr=1.0.Marland KitchenMarland Kitchen  ~  November 08, 2013:  6-38mo ROV & post hosp check> Todd Kelly was hosp 10/23 - 08/14/13 by [facility] w/ incr SOB, orthop & PND + edema; Dx w/ ac on chr sysCHF (2DEcho w/ globalHK, inferiorAK, EF=30-35%)- he was diuresed and disch on Lasix40, sodium restriction, daily wts etc; he had AFib & [facility] started Amio- converted to NSR; underlying COPD- no change in therapy this adm... He had f/u DrCroitoru 11/14> PAF, Pacemaker for  CHB placed in 2009- on [place]; CAD- s/p CABG, CHF w/ dry wt ~190-195 & instructed to take extra Lasix & KCl if wt >195#...    COPD> on Pred5mg /d, Symbicort160-2spBid, Spiriva daily, Singulair10, & Mucinex; breathing improved- min wheezing w/ exertion, mild cough, beige sput, DOE w/o change, no edema; [facility] & he is asked to walk on the other days...    HBP> on Lopressor25Bid, Cozaar100, Lasix40Qd now, KCl20/d ; BP= 122/70 & similar at home; denies CP, palpit, syncope,  ch in SOB, edema...    CAD, s/p CABG, Cardiomyopathy w/ EF=30-35%> on ASA + the above; denies angina & only [facility] he says; CHF improved after vigorous diuresis by DrCroitoru now on Lasix40 w/ extra40 if wt>195#...    AFib, Pacer> on Amio200, Xarelto20 + the above; stable w/o bleeding problems etc; DrC mentioned low AF burden & may be able to stop Amio...    Cerebrovasc dis> on ASA & Xarelto as noted; followed by [facility]; pt states last CDoppler was 10/11 & reported "OK", he denies cerebral ischemic symptoms.    ASPVD> on ASA & Xarelto as noted; followed by [facility] w/ prev right fem art thrombectomy & right RA stent...    Chol> on Atorva40, FishOil, CoQ10; FLP 1/15 shows TChol 108, TG 70, HDL 39, LDL 55; needs same med & great job w/ wt reduction...    Obesity> weight= 199# stable; great job w/ 50# wt loss on diet & exercise...    GI> Divertics, Polyps, Hems> off Prilosec & on Ranitadine 150mg Bid; on Miralax, Colase, AnusolHC; obese protuberant abd; denies pain, N/ V/ D/ C/ blood etc...    DJD, LBP> on Vicodin & averages 1-2 daily...    Anxiety> on Xanax and averages 1/2 tab bid...    Anemia> w/ Fe defic & B12 defic discovered on labs; pt to start FeSO4 daily (and refer to GI- DrStark) & B12 1052mcg daily w/ f/u labs in several months... We reviewed prob list, meds, xrays and labs> see below for updates >>  CXR 1/15 showed sl cardiomeg, s/p CABG, pacer on left, chr lung  changes, resolution of prev right effusion, NAD... LABS 1/15:  FLP- at goals on Lip40;  Chems- wnl w/ Cr=1.4;  CBC- anemic w/ Hg=10.5 MCV=78 Fe=41 (11%);  TSH=1.96;  PSA=1.93;  B12=159 & Rec to refer to GI for heme+stool, start FeSO4 w/ VitC, and B12 1000 poQd ( recheck in several months)...  ~  January 10, 2014:  68mo ROV & after last visit we referred him to GI due to anemia- Hg=10.5, Fe-41 (11%), B12=159; he started FeSO4, B12 orally (1000mg /d), but never went to see DrStark as requested (pt cancelled the appt); Labs were rechecked 3/15 w/ Hg improved to 12.9, MCV=91, Fe=54 (18%), and B12 level = 726...  He is rec to continue Fe daily, f/u w/ GI as planned, and ok to cut B12 oral supplement to 1/2 tab daily...     Breathing is stable on Pred5mg /d, Symbicort160-2spBid, Spiriva daily, Singulair10, & Mucinex; continue same...    He saw DrC for Cards 3/15> HBP, CAD, s/p CABG, AFib, pacer, Cardiomyopathy w/ EF=30-35%> on ASA, Amio200, Lopressor25Bid, Cozaar100, Lasix40Qd, KCl20/d, Xarelto20; level 5 visit- note reviewed, no changes made to his meds...    We reviewed prob list, meds, xrays and labs> see below for updates >> he has arranged for4 medical f/u w/ DrFry...          Problem List:  BRONCHITIS, CHRONIC, ACUTE EXACERBATION (ICD-491.21) >> see prev notes... COPD (ICD-496) - ex-smoker, freq flairs when off Rx... now taking: SYMBICORT 160- 2spBid,  SPIRIVA daily, SINGULAIR 10mg /d,  MUCINEX 1-2 Bid w/ fluids, + FLONASE... ~  baseline CXR shows COPD, prev median sternotomy, NAD.Marland KitchenMarland Kitchen  ~   CT CHEST 4/09 w/ extensive coronary calcif, prior CABG, no signif abn in the lungs. ~  PFT's 4/09 showed FVC=2.88 (71%), FEV1=2.13 (67%), FEV1/ FVC= 74%, mid-flows= 51%pred... ~  CXR 3/10 showed sl cardiomeg, pleural thickening, pacer, DJD sp, NAD. ~  CXR 12/11 showed s/p CABG, pacer on left, borderline cardiomeg, basilar scarring, NAD. ~  CXR 4/12 showed s/p CABG, pacer, extrapleural fat deposition bilat, NAD.[name]. ~   CXR 1/14 showed mod cardiomeg, prev median sternotomy, pacer, clear lungs & DJD spine... ~  7/14:  Pred is down to 5mg /d & he is much improved w/ the wt reduction... ~  1/15: on Pred5mg /d, Symbicort160-2spBid, Spiriva daily, Singulair10, & Mucinex; breathing improved- min wheezing w/ exertion, mild cough, beige sput, DOE w/o change, no edema; [facility] & he is asked to walk on the other days.  OBSTRUCTIVE SLEEP APNEA (ICD-327.23) - he does not tolerate his CPAP...  HYPERTENSION (ICD-401.9) controlled on meds:  METOPROLOL 25mg Bid, COZAAR 100/d, LASIX 40mg /d; BP= 140/70 & similar at home; denies CP, palpit, syncope, ch in SOB, edema...  ARTERIOSCLEROTIC HEART DISEASE >> s/p CABG 2005 ISCHEMIC CARDIOMYOPATHY >> EF=35-40% ~  he is on ASA 81mg /d,  Now XARELTO 20mg /d, + meds as listed above... ~   CT CHEST 4/09 w/ extensive coronary calcif, prior CABG, no signif abn in the lungs. ~  cath 4/09 showed patent grafts, some disease in Circ & RCA, patent stent to left renal art... med Rx.  ~  2DEcho 5/11 showed mod conc LVH, mod reduced LVF w/ EF= 35-40% w/ apical AK, dil RA... ~  Nuclear Stress Test 11/11 showed a mild perfusion defect inferiorly consistent w/ a scar, global LVF is reduced w/ EF= 38%, dyssynchony due to RV apical pacing... ~  Followed by [facility], DrCroitoru> notes reviewd... ~  Adm 10/14 by [facility] w/ ac on chr sysCHF w/ 2DEcho showing globalHK, inferiorAK, EF=30-35%, diuresed & meds adjusted...  ATRIAL FIBRILLATION & Tachy-Brady Syndrome >> COMPLETE HEART BLOCK & CARDIAC PACEMAKER IN SITU >> ~  he had syncope w/ 4 fx right ribs and CHB & AFib requiring pacer & Coumadin started... f/u by DrSolomon [facility] + DrCroitoru... f/u pacer checks OK & no changes made... ~  Switched to Xarelto by [facility] and Amio200mg /d added 10/14 hosp...  CEREBROVASCULAR DISEASE (ICD-437.9) - S/P Bilat CAE's 2000 & doppler's are followed by DrBerry...  ~  last CDoppler reported by [facility] 4/09  showed mod distal right CCA stenosis. ~  pt reports CDopplers done after 10/11 OV w/ DrSolomon was "OK" ~  DrC's note of 4/13 indicates last CDoppler w/ 50-69% restenosis on the right... ~  CDopplers 6/14 showed 50-69% right stenosis in the bulb & left wqas open & patent....  PERIPHERAL VASCULAR DISEASE (ICD-443.9) - S/P Rt fem art thrombectomy 2000, and Rt renal art stent 2002... he has all this followed by [facility] now & last doppler & cath showed patent stent.  HYPERCHOLESTEROLEMIA (ICD-272.0) - controlled on SIMVASTATIN 80mg /d & Fish Oil and labs followed by DrBerry (pt will ask for copies to be sent to [place])... he recently added NIASPAN for HDL of 31, but this was stopped due to flushing/ intol...  ~  [place] 12/09 showed TChol 138, TG 220, HDL 34, LDL 60 ~  FLP 12/10 showed TChol 132, TG 253, HDL 33, LDL 61 ~  FLP 12/11 showed TChol 144, TG 200, HDL 33, LDL 71... advised low fat diet & get weight down ~  FLP 12/12 on Simva80 showed  TChol 156, TG 195, HDL 42, LDL 75; needs better low fat diet & wt reduction... ~  FLP 1/15 on Lip40 showed TChol 108, TG 70, HDL 39, LDL 55  OBESITY (ICD-278.00) - weight fluctuates betw 240-250# & he has been  unable to lose weight... he is sedentary and we discussed an exercise program for him, since he is limited by LBP. ~  NOTE:  labs 12/09 showed BS= 131, HgA1c= 6.1.[name]. advised low carb diet, get wt down! ~  6/10: wt today= 249#, but pt states down to 233# at home- "just decr portions" ~  12/10:  wt today= 252# post holiday... he states "I'm starving myself to death"... ~  04-09-23:  weight = 252#.[name]. states he's lost 4" at the waist... ~  12/11:  weight = 252# ~  4/12:  Weight = 256# ~  6/12:  Weight = 262# ~  12/12:  Weight = 262# ~  6/13:  Weight = 246# ~  7/14:  Weight = 211# ~  1/15:  Weight = 199#  DIVERTICULOSIS OF COLON (ICD-562.10) COLONIC POLYPS (ICD-211.3) HEMORRHOIDS (ICD-455.6) ~  last colonoscopy was 12/07 by DrStark and showed divertic, polyps 2-61mm  size (adenomas), and hems... f/u planned 81yrs. ~  CT Abd&Pelvis 7/10 showed several sm gallstones in gallbladder, bilat renal cysts, 1.7cm left adrenal adenoma & 81mm right adrenal lesion, advanced atherosclerotic changes in distalAo & branches, sm bilat inguinal hernias containing fat; DDD & old healed right rib fx.  DEGENERATIVE JOINT DISEASE (ICD-715.90) LOW BACK PAIN, CHRONIC (ICD-724.2) ~  he uses DCN 100 for pain and prev requested refill of #360 for 3 month supply... ~  12/10: DCN now off the market & Rx for Vicodin #270- up to 3/d as needed for pain. ~  12/12:  He is c/o incr back pain & he doesn't want to f/u w/ DrApling  ANXIETY (ICD-300.00) - he uses ALPRAZOLAM 0.5mg  Tid and prev requested #270 for 3 month supply...  ANEMIA >> Low Fe w/ heme pos stools and B12 defic... ~  Labs 1/15 showed Hg= Hg=10.5 MCV=78 Fe=41 (11%); B12=159 & Rec to refer to GI for heme+stool, start FeSO4 w/ VitC, and B12 1000 poQd ( recheck in several months). ~  Labs 3/15 showed Hg= 12.9, MCV= 91, Fe= 54 (18%), B12= 726... re3c to continue Fe for now, reconsider his refusal of GI eval, cut B12 in half...   Past Surgical History  Procedure Laterality Date  . Pta to right common femoral artery  2001    Dr. Lyndel Safe  . Carotid endarterectomy  2002    bilateral.  Dr. Kellie Simmering  . Coronary artery bypass graft  2005  . Pacemaker placement  July 2009    for CHB  Dr. Gwenlyn Found. [employer]  . Appendectomy  1948  . US echocardiography  11/06/2011    Mod. LVH w/mod. depressed systolic function,EF 58-52%,DPOEU LV hypokinesis,mildly dilated LA,mild aortic root dilatation  . Nm myoview ltd  09/04/2010    mild perfusion defect basal inferior,mid inferior, & apical inferior regions. LV systolic function deteriorated since 2006  . Cardiac catheterization  02/02/2008    patent grafts    Outpatient Encounter Prescriptions as of 01/10/2014  Medication Sig  . ALPRAZolam (XANAX) 0.5 MG tablet Take 1 tablet (0.5 mg total) by mouth 3  (three) times daily as needed for anxiety. Not to exceed 3 per day.  [name] amiodarone (PACERONE) 200 MG tablet Take 1 tablet (200 mg total) by mouth daily.  [name] aspirin 81 MG tablet Take 81 mg by mouth daily.    [name] atorvastatin (LIPITOR) 40 MG tablet Take 1 tablet (40 mg total) by mouth daily.  . budesonide-formoterol (SYMBICORT) 160-4.5 MCG/ACT inhaler Inhale 2 puffs into the lungs 2 (two) times daily.  [name]  Coenzyme Q10 (COQ10) 100 MG CAPS Take 100 mg by mouth daily.   [name] docusate sodium (COLACE) 100 MG capsule Take 200 mg by mouth daily.   . ferrous sulfate 325 (65 FE) MG tablet Take 325 mg by mouth daily with breakfast.  . fluticasone (FLONASE) 50 MCG/ACT nasal spray Place 1 spray into both nostrils 2 (two) times daily.  . furosemide (LASIX) 40 MG tablet Take 1 tablet (40 mg total) by mouth daily.  [name] guaiFENesin (MUCINEX) 600 MG 12 hr tablet Take 2 tablets (1,200 mg total) by mouth 2 (two) times daily as needed for congestion.  [name] HYDROcodone-acetaminophen (NORCO/VICODIN) 5-325 MG per tablet Take 1/2 to 1 tablet by mouth three times daily as needed for pain  . losartan (COZAAR) 100 MG tablet Take 1 tablet (100 mg total) by mouth at bedtime.  . meclizine (ANTIVERT) 25 MG tablet Take 1/2 to 1 tablet by mouth every 6 hours as needed for dizziness  . metoprolol tartrate (LOPRESSOR) 25 MG tablet Take 1 tablet (25 mg total) by mouth 2 (two) times daily.  . montelukast (SINGULAIR) 10 MG tablet TAKE 1 TABLET AT BEDTIME  . Omega-3 Fatty Acids (FISH OIL) 1000 MG CAPS Take 1 capsule by mouth daily.    . polyethylene glycol (MIRALAX / GLYCOLAX) packet Take 17 g by mouth daily as needed.   . potassium chloride SA (K-DUR,KLOR-CON) 20 MEQ tablet Take 1 tablet (20 mEq total) by mouth daily.  . predniSONE (DELTASONE) 5 MG tablet Take 5 mg by mouth daily.   . ranitidine (ZANTAC) 150 MG capsule Take 1 capsule (150 mg total) by mouth 2 (two) times daily.  . Rivaroxaban (XARELTO) 20 MG TABS tablet Take 1 tablet (20 mg  total) by mouth daily with supper.  . tiotropium (SPIRIVA HANDIHALER) 18 MCG inhalation capsule INHALE THE CONTENTS OF 1 CAPSULE DAILY  . vitamin B-12 (CYANOCOBALAMIN) 500 MCG tablet Take 500 mcg by mouth daily.  . vitamin C (ASCORBIC ACID) 500 MG tablet Take 500 mg by mouth daily.    Allergies  Allergen Reactions  . Penicillins Anaphylaxis and Hives    REACTION: Allergic to PCN w/ throat swelling \\T \ hives    Current Medications, Allergies, Past Medical History, Past Surgical History, Family History, and Social History were reviewed in [employer] record.    Review of Systems       See HPI - all other systems neg except as noted...      The patient complains of dyspnea on exertion.  The patient denies anorexia, fever, weight loss, weight gain, vision loss, decreased hearing, hoarseness, chest pain, syncope, peripheral edema, prolonged cough, headaches, hemoptysis, abdominal pain, melena, hematochezia, severe indigestion/heartburn, hematuria, incontinence, muscle weakness, suspicious skin lesions, transient blindness, difficulty walking, depression, unusual weight change, abnormal bleeding, enlarged lymph nodes, and angioedema.     Objective:   Physical Exam     WD, Obese, 80 y/o WM in NAD... GENERAL:  Alert & oriented; pleasant & cooperative... HEENT:  [place]/AT, EOM-full, PERRLA, TMs- distal wax blocking left TM, NOSE-clear, THROAT- sl red w/o exud... NECK:  Supple w/ fair ROM; no JVD; s/p bilat CAE's w/ bruits; no thyromegaly or nodules palpated; no lymphadenopathy. CHEST:  decr BS bilat w/ few scat rhonchi, no rales, no signs of consolidation... HEART:  Regular Rhythm; without murmurs/ rubs/ or gallops- median sternotomy scar...pacemaker in place ABDOMEN:  Obese, soft & nontender; normal bowel sounds; no organomegaly or masses detected. EXT: warm and dry, mild arthritic changes; no varicose  veins/ +venous insuffic/ no edema. NEURO:  CN's intact; no focal neuro  deficits...  DERM: no lesions seen, no rash etc...  RADIOLOGY DATA:  Reviewed in the EPIC EMR & discussed w/ the patient...  LABORATORY DATA:  Reviewed in the EPIC EMR & discussed w/ the patient...   Assessment & Plan:    COPD/ Chr Bronchitis>  Improved w/ the Pred at 5mg  Qd; continue this and his other meds: Symbicort, Spiriva, Singulair, Mucinex, etc...  HBP, CHF w/ combined sys & dias>  Fair control on meds, adjusted recently by [facility] w/ incr Lasix if wt>195#, continue same for now...  ASHD/ AFib/ Pacer>  Followed by DrCroitoru [facility] & stable; on Amio & Xarelto...  Cerebrovasc & Peripheral vasc dis>  Also followed regularly by [facility] w/ CDopplers as noted...  CHOL>  He on Lip40 from [facility] [place] shows improvement w/ all parameters wnl...  Obesity>  Nice job w/ wt reduction...  DJD>  Feeling better w/ the Pred, uses Vicodin as needed...  Anxiety>  Stable on Alpraz Prn...  ANEMIA> [id number]- Hg=10.5 MCV=78 Fe=41 (11%);  B12=159 & Rec to refer to GI for heme+stool, start FeSO4 w/ VitC, and B12 1000 poQd ( recheck in several months).   Patient's Medications  New Prescriptions   No medications on file  Previous Medications   ALPRAZOLAM (XANAX) 0.5 MG TABLET    Take 1 tablet (0.5 mg total) by mouth 3 (three) times daily as needed for anxiety. Not to exceed 3 per day.   AMIODARONE (PACERONE) 200 MG TABLET    Take 1 tablet (200 mg total) by mouth daily.   ASPIRIN 81 MG TABLET    Take 81 mg by mouth daily.     ATORVASTATIN (LIPITOR) 40 MG TABLET    Take 1 tablet (40 mg total) by mouth daily.   BUDESONIDE-FORMOTEROL (SYMBICORT) 160-4.5 MCG/ACT INHALER    Inhale 2 puffs into the lungs 2 (two) times daily.   COENZYME Q10 (COQ10) 100 MG CAPS    Take 100 mg by mouth daily.    DOCUSATE SODIUM (COLACE) 100 MG CAPSULE    Take 200 mg by mouth daily.    FERROUS SULFATE 325 (65 FE) MG TABLET    Take 325 mg by mouth daily with breakfast.   FLUTICASONE (FLONASE) 50 MCG/ACT NASAL SPRAY    Place 1 spray  into both nostrils 2 (two) times daily.   FUROSEMIDE (LASIX) 40 MG TABLET    Take 1 tablet (40 mg total) by mouth daily.   GUAIFENESIN (MUCINEX) 600 MG 12 HR TABLET    Take 2 tablets (1,200 mg total) by mouth 2 (two) times daily as needed for congestion.   LOSARTAN (COZAAR) 100 MG TABLET    Take 1 tablet (100 mg total) by mouth at bedtime.   MECLIZINE (ANTIVERT) 25 MG TABLET    Take 1/2 to 1 tablet by mouth every 6 hours as needed for dizziness   METOPROLOL TARTRATE (LOPRESSOR) 25 MG TABLET    Take 1 tablet (25 mg total) by mouth 2 (two) times daily.   MONTELUKAST (SINGULAIR) 10 MG TABLET    TAKE 1 TABLET AT BEDTIME   OMEGA-3 FATTY ACIDS (FISH OIL) 1000 MG CAPS    Take 1 capsule by mouth daily.     POLYETHYLENE GLYCOL (MIRALAX / GLYCOLAX) PACKET    Take 17 g by mouth daily as needed.    POTASSIUM CHLORIDE SA (K-DUR,KLOR-CON) 20 MEQ TABLET    Take 1 tablet (20 mEq total) by mouth  daily.   PREDNISONE (DELTASONE) 5 MG TABLET    Take 5 mg by mouth daily.    RANITIDINE (ZANTAC) 150 MG CAPSULE    Take 1 capsule (150 mg total) by mouth 2 (two) times daily.   RIVAROXABAN (XARELTO) 20 MG TABS TABLET    Take 1 tablet (20 mg total) by mouth daily with supper.   TIOTROPIUM (SPIRIVA HANDIHALER) 18 MCG INHALATION CAPSULE    INHALE THE CONTENTS OF 1 CAPSULE DAILY   VITAMIN B-12 (CYANOCOBALAMIN) 500 MCG TABLET    Take 500 mcg by mouth daily.   VITAMIN C (ASCORBIC ACID) 500 MG TABLET    Take 500 mg by mouth daily.  Modified Medications   Modified Medication Previous Medication   HYDROCODONE-ACETAMINOPHEN (NORCO/VICODIN) 5-325 MG PER TABLET HYDROcodone-acetaminophen (NORCO/VICODIN) 5-325 MG per tablet      Take 1/2 to 1 tablet by mouth three times daily as needed for pain    Take 1/2 to 1 tablet by mouth three times daily as needed for pain  Discontinued Medications   No medications on file

## 2014-01-29 ENCOUNTER — Encounter: Payer: Self-pay | Admitting: Cardiovascular Disease

## 2014-01-31 ENCOUNTER — Telehealth: Payer: Self-pay | Admitting: Pulmonary Disease

## 2014-01-31 MED ORDER — HYDROCODONE-ACETAMINOPHEN 5-325 MG PO TABS: ORAL_TABLET | ORAL | Status: DC

## 2014-01-31 NOTE — Telephone Encounter (Signed)
RX printed and placed on SN cart for signature. Please advise once ready thanks

## 2014-02-01 NOTE — Telephone Encounter (Signed)
rx has been signed by SN and placed up front for pick up. i have called the pt and lmom to make him aware.

## 2014-03-06 ENCOUNTER — Other Ambulatory Visit: Payer: Self-pay | Admitting: Cardiovascular Disease

## 2014-03-06 NOTE — Telephone Encounter (Signed)
Rx was sent to pharmacy electronically. 

## 2014-03-25 ENCOUNTER — Encounter: Payer: Self-pay | Admitting: Cardiovascular Disease

## 2014-03-25 ENCOUNTER — Ambulatory Visit (INDEPENDENT_AMBULATORY_CARE_PROVIDER_SITE_OTHER): Payer: 59 | Admitting: Cardiovascular Disease

## 2014-03-25 VITALS — BP 124/60 | HR 61 | Ht 69.0 in | Wt 194.5 lb

## 2014-03-25 DIAGNOSIS — I442 Atrioventricular block, complete: Secondary | ICD-10-CM

## 2014-03-25 DIAGNOSIS — I255 Ischemic cardiomyopathy: Secondary | ICD-10-CM

## 2014-03-25 DIAGNOSIS — I2589 Other forms of chronic ischemic heart disease: Secondary | ICD-10-CM

## 2014-03-25 DIAGNOSIS — I739 Peripheral vascular disease, unspecified: Secondary | ICD-10-CM

## 2014-03-25 DIAGNOSIS — I4891 Unspecified atrial fibrillation: Secondary | ICD-10-CM

## 2014-03-25 DIAGNOSIS — Z950 Presence of cardiac pacemaker: Secondary | ICD-10-CM

## 2014-03-25 DIAGNOSIS — I2510 Atherosclerotic heart disease of native coronary artery without angina pectoris: Secondary | ICD-10-CM

## 2014-03-25 DIAGNOSIS — E7800 Pure hypercholesterolemia, unspecified: Secondary | ICD-10-CM

## 2014-03-25 DIAGNOSIS — I10 Essential (primary) hypertension: Secondary | ICD-10-CM

## 2014-03-25 LAB — MDC_IDC_ENUM_SESS_TYPE_INCLINIC
Battery Impedance: 1500 Ohm
Battery Voltage: 2.76 V
Brady Statistic RA Percent Paced: 94 %
Implantable Pulse Generator Serial Number: 1247203
Lead Channel Impedance Value: 370 Ohm
Lead Channel Pacing Threshold Amplitude: 0.5 V
Lead Channel Pacing Threshold Pulse Width: 0.5 ms
Lead Channel Setting Pacing Pulse Width: 0.5 ms
Lead Channel Setting Sensing Sensitivity: 4 mV
MDC IDC MSMT LEADCHNL RA SENSING INTR AMPL: 2.4 mV
MDC IDC MSMT LEADCHNL RV IMPEDANCE VALUE: 458 Ohm
MDC IDC MSMT LEADCHNL RV PACING THRESHOLD AMPLITUDE: 0.875 V
MDC IDC MSMT LEADCHNL RV PACING THRESHOLD PULSEWIDTH: 0.4 ms
MDC IDC SESS DTM: 20150608084936
MDC IDC SET LEADCHNL RA PACING AMPLITUDE: 2 V
MDC IDC STAT BRADY RV PERCENT PACED: 96 %

## 2014-03-25 LAB — PACEMAKER DEVICE OBSERVATION

## 2014-03-25 NOTE — Patient Instructions (Signed)
Your physician recommends that you schedule a follow-up appointment in: 3 months + pacemaker check

## 2014-03-26 ENCOUNTER — Encounter: Payer: Self-pay | Admitting: Cardiovascular Disease

## 2014-03-26 NOTE — Progress Notes (Signed)
Patient ID: Todd Kelly, male   DOB: January 14, 1931, 80 y.o.   MRN: [id number]      Reason for office visit CHF, pacemaker followup (complete heart block), paroxysmal atrial fibrillation    Todd Kelly has a lengthy and complicated cardiac history including coronary disease (coronary bypass surgery 2005, last cardiac cath 2009, low risk nuclear stress test 2011), moderate ischemic cardiomyopathy with a left ventricular ejection fraction of 35%, complete heart block status post dual-chamber pacemaker implantation (2009, [employer]), congestive heart failure with episode of acute exacerbation roughly 9 months ago, current paroxysmal atrial fibrillation.  He was recently diagnosed with iron deficiency anemia which appears to be in improving as he is receiving iron supplements. Dr. Lenna Kelly has encouraged him to undergo colonoscopy since he has Hemoccult-positive stools, but Todd Kelly is very reluctant. He is afraid of complications of the colonoscopy and states that under no circumstances would he undergo surgery unit he was diagnosed with colon cancer. 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 His breathing is poor both because of CHF and COPD, it appears to be at baseline. He has NYHA functional class 2-3 status. He denies recent wheezing and has not had edema. He does not have orthopnea or PND. Feels best when his weight is around 190 pounds and takes torsemide only when his weight exceeds 193 pounds. Our office scale appears to overestimate his home weight by about 6-7 pounds.  Interrogation of his pacemaker shows no evidence of recent atrial fibrillation or atrial tachycardia since his last device check. He is 94% atrial pacing at 96% ventricular pacing. The device is a [employer] Zephyr dual-chamber pacemaker that is not capable of remote monitoring. He occasionally feels  swimmy headed. He had a single episode of chest discomfort that lasted for only seconds. It happened yesterday. His primary care physician (Dr. Maurice Kelly) didrefer him to an ENT doctor who found no problems with his vestibular system.   Allergies  Allergen Reactions  . Penicillins Anaphylaxis and Hives    REACTION: Allergic to PCN w/ throat swelling \\T \ hives    Current Outpatient Prescriptions  Medication Sig Dispense Refill  . ALPRAZolam (XANAX) 0.5 MG tablet Take 1 tablet (0.5 mg total) by mouth 3 (three) times daily as needed for anxiety. Not to exceed 3 per day.  270 tablet  1  . amiodarone (PACERONE) 200 MG tablet Take 1 tablet (200 mg total) by mouth daily.  90 tablet  3  . aspirin 81 MG tablet Take 81 mg by mouth daily.        [name] atorvastatin (LIPITOR) 40 MG tablet Take 1 tablet (40 mg total) by mouth daily.  90 tablet  3  . budesonide-formoterol (SYMBICORT) 160-4.5 MCG/ACT inhaler Inhale 2 puffs into the lungs 2 (two) times daily.  3 Inhaler  3  . Coenzyme Q10 (COQ10) 100 MG CAPS Take 100 mg by mouth daily.       [name] docusate sodium (COLACE) 100 MG capsule Take 200 mg by mouth daily.       . ferrous sulfate 325 (65 FE) MG tablet Take 325 mg by mouth daily with breakfast.      . fluticasone (FLONASE) 50 MCG/ACT nasal spray Place 1 spray into both nostrils 2 (two) times daily.  48 g  3  . furosemide (LASIX) 40 MG tablet Take 1 tablet (40 mg total) by mouth daily.  90 tablet  3  . guaiFENesin (MUCINEX) 600 MG 12 hr tablet Take 2 tablets (1,200 mg total) by mouth 2 (two) times daily as needed for congestion.      [name] HYDROcodone-acetaminophen (NORCO/VICODIN) 5-325 MG per tablet Take 1/2 to 1 tablet by mouth three times daily as needed for pain  90 tablet  0  . losartan (COZAAR) 100 MG tablet Take 1 tablet (100 mg total) by mouth at bedtime.  90 tablet  3  . meclizine (ANTIVERT) 25 MG tablet Take 1/2 to 1 tablet by mouth every 6 hours as needed for dizziness  90 tablet  3  . metoprolol  tartrate (LOPRESSOR) 25 MG tablet Take 1 tablet (25 mg total) by mouth 2 (two) times daily.  90 tablet  3  . montelukast (SINGULAIR) 10 MG tablet TAKE 1 TABLET AT BEDTIME  90 tablet  3  . Omega-3 Fatty Acids (FISH OIL) 1000 MG CAPS Take 1 capsule by mouth daily.        . polyethylene glycol (MIRALAX / GLYCOLAX) packet Take 17 g by mouth daily as needed.       . potassium chloride SA (K-DUR,KLOR-CON) 20 MEQ tablet Take 1 tablet (20 mEq total) by mouth daily.  90 tablet  3  . predniSONE (DELTASONE) 5 MG tablet Take 5 mg by mouth daily.       . ranitidine (ZANTAC) 150 MG capsule Take 1 capsule (150 mg total) by mouth 2 (two) times daily.  180 capsule  3  . ranitidine (ZANTAC) 150 MG tablet TAKE 1 TABLET TWICE A DAY  180 tablet  2  . Rivaroxaban (XARELTO) 20 MG TABS tablet Take 1 tablet (20 mg total) by mouth daily with supper.  90 tablet  3  . tiotropium (SPIRIVA HANDIHALER) 18 MCG inhalation capsule INHALE THE CONTENTS OF 1 CAPSULE DAILY  90 capsule  3  . vitamin B-12 (CYANOCOBALAMIN) 500 MCG tablet Take 500 mcg by mouth daily.      . vitamin C (ASCORBIC ACID) 500 MG tablet Take 500 mg by mouth daily.       No current facility-administered medications for this visit.   Facility-Administered Medications Ordered in Other Visits  Medication Dose Route Frequency Provider Last Rate Last Dose  . 0.9 %  sodium chloride infusion    Continuous PRN Durene Romans, CRNA        Past Medical History  Diagnosis Date  . OSA (obstructive sleep apnea)   . Bronchial pneumonia   . Bronchitis, chronic with acute exacerbation   . COPD (chronic obstructive pulmonary disease)   . Hypertension   . Arteriosclerotic heart disease   . Atrial fibrillation   . Cardiac pacemaker in situ 04/12/2008    [name]  . Cerebrovascular disease   . Peripheral vascular disease   . Hypercholesteremia   . Obesity   . Diverticulosis of colon   . Adenomatous polyp of colon 08/1994  . Hemorrhoids   . DJD  (degenerative joint disease)   . Chronic low back pain   . Anxiety   . CHF (congestive heart failure)   . CHB (complete heart block)   . Carotid arterial disease   . CAD (coronary artery disease)   . S/P CABG (coronary artery bypass graft) 2005    Past Surgical History  Procedure Laterality Date  . Pta to right common femoral artery  2001    Dr. Lyndel Safe  . Carotid endarterectomy  2002    bilateral.  Dr. Kellie Simmering  .  Coronary artery bypass graft  2005  . Pacemaker placement  July 2009    for CHB  Dr. Gwenlyn Found. [employer]  . Appendectomy  1948  . US echocardiography  11/06/2011    Mod. LVH w/mod. depressed systolic function,EF 41-32%,GMWNU LV hypokinesis,mildly dilated LA,mild aortic root dilatation  . Nm myoview ltd  09/04/2010    mild perfusion defect basal inferior,mid inferior, & apical inferior regions. LV systolic function deteriorated since 2006  . Cardiac catheterization  02/02/2008    patent grafts    Family History  Problem Relation Age of Onset  . Asthma Paternal Uncle   . Heart disease Paternal Grandmother   . Heart disease Mother   . Heart attack Father   . Rheum arthritis Father   . Rheum arthritis Paternal Uncle   . Pancreatic cancer Mother   . Hyperlipidemia Mother     History   Social History  . Marital Status: Married    Spouse Name: Enid Derry    Number of Children: N  . Years of Education: N/A   Occupational History  . [employer]     retired  . DRIVER    Social History Main Topics  . Smoking status: Former Smoker -- 1.50 packs/day for 54 years    Types: Cigarettes    Quit date: 10/18/1996  . Smokeless tobacco: Former Systems developer    Types: Chew     Comment: only used chewing tobacco while working in the yard a couple of times  . Alcohol Use: No  . Drug Use: No  . Sexual Activity: Not on file   Other Topics Concern  . Not on file   Social History Narrative   No alcohol.   Drinks 1 cup of caffeine.    Review of systems: The patient  specifically denies any chest pain at rest or with usual exertion, dyspnea at rest or with exertion, orthopnea, paroxysmal nocturnal dyspnea, syncope, palpitations, focal neurological deficits, intermittent claudication, lower extremity edema, unexplained weight gain, cough, hemoptysis or wheezing.  The patient also denies abdominal pain, nausea, vomiting, dysphagia, diarrhea, constipation, polyuria, polydipsia, dysuria, hematuria, frequency, urgency, abnormal bleeding or bruising, fever, chills, unexpected weight changes, mood swings, change in skin or hair texture, change in voice quality, auditory or visual problems, allergic reactions or rashes, new musculoskeletal complaints other than usual "aches and pains".   PHYSICAL EXAM BP 124/60  Pulse 61  Ht 5\' 9"  (1.753 m)  Wt 194 lb 8 oz (88.225 kg)  BMI 28.71 kg/m2 General: Alert, oriented x3, no distress  Head: no evidence of trauma, PERRL, EOMI, no exophtalmos or lid lag, no myxedema, no xanthelasma; normal ears, nose and oropharynx  Neck: normal jugular venous pulsations and no hepatojugular reflux; brisk carotid pulses without delay and no carotid bruits  Chest: Emphysematous chest, diminished breath sounds throughout but otherwise clear to auscultation, no signs of consolidation by percussion or palpation, normal fremitus, symmetrical and full respiratory excursions; healthy subclavian pacemaker site  Cardiovascular: normal position and quality of the apical impulse, regular rhythm, normal first and paradoxically split second heart sounds, no murmurs, rubs or gallops  Abdomen: no tenderness or distention, no masses by palpation, no abnormal pulsatility or arterial bruits, normal bowel sounds, no hepatosplenomegaly  Extremities: no clubbing, cyanosis or edema; 2+ radial, ulnar and brachial pulses bilaterally; 2+ right femoral, posterior tibial and dorsalis pedis pulses; 2+ left femoral, posterior tibial and dorsalis pedis pulses; no subclavian or  femoral bruits  Neurological: grossly nonfocal   EKG: Atrial  ventricular sequential paced  Lipid Panel     Component Value Date/Time   CHOL 108 11/02/2013 0756   TRIG 70.0 11/02/2013 0756   HDL 39.30 11/02/2013 0756   CHOLHDL 3 11/02/2013 0756   VLDL 14.0 11/02/2013 0756   LDLCALC 55 11/02/2013 0756    BMET    Component Value Date/Time   NA 142 11/02/2013 0756   K 4.0 11/02/2013 0756   CL 107 11/02/2013 0756   CO2 28 11/02/2013 0756   GLUCOSE 85 11/02/2013 0756   GLUCOSE 109* 09/22/2006 1133   BUN 34* 11/02/2013 0756   CREATININE 1.4 11/02/2013 0756   CREATININE 1.19 09/14/2013 1652   CALCIUM 8.8 11/02/2013 0756   GFRNONAA 78* 08/14/2013 0600   GFRAA >90 08/14/2013 0600     ASSESSMENT AND PLAN  CAD s/p CABG '05, patent grafts 4/09, low risk Nuc 2011  CABG 2005, Dr. Servando Snare, LIMA to LAD, SVG to intermediate, SVG to second intermedius. Angiography 2009 shows all grafts patent, nondominant right coronary artery sequential moderate stenoses in the AV groove portion left circumflex coronary artery.  Currently asymptomatic. No change in therapy planned.   Cardiomyopathy, ischemic- EF 35%  Currently NYHA functional class II. Dry weight seems to be around 190 pounds (home scale)and he is doing a good job dosing his diuretic.  Continue sodium restriction and daily weights. He is on appropriate treatment with a full dose of angiotensin receptor blocker. Will not increase the beta blocker dose because of his reactive airway disease.   Complete heart block pacemaker dependent  Cardiac pacemaker in situ- [employer] Jude for CHB 7/09- pacer dependednt  Normal device function. Continue with checks in office every 3 months. He could well benefit from upgrade of his device to a biventricular pacemaker (CRT-P), but prefers nonsurgical management as long as he feels well.   PAF- Amiodarone added 10/14  Atrial fibrillation seems to have been part of his heart failure decompensation, but retrospectively one  wonders whether the anemia was also an important factor. Will require periodic liver function tests and thyroid function tests while on amiodarone therapy. The benefit of permanent anticoagulation needs to be weighed against the risk of bleeding and anemia. His anemia seems to be markedly improved.   HYPERCHOLESTEROLEMIA  Excellent recent lipid profile   PVD - CEA '02, RCFA PTA '01  Denies intermittent claudication or any neurological symptoms. I don't think his dizziness can be explained on a cardiovascular basis either. It is definitely not arrhythmia related.   Orders Placed This Encounter  Procedures  . Implantable device check  . EKG 12-Lead     Josue Falconi  Sanda Klein, MD, [facility] [phone] office [phone] pager

## 2014-05-13 ENCOUNTER — Ambulatory Visit: Payer: 59 | Admitting: Pulmonary Disease

## 2014-06-25 ENCOUNTER — Ambulatory Visit: Payer: 59 | Admitting: Internal Medicine

## 2014-06-26 ENCOUNTER — Telehealth: Payer: Self-pay | Admitting: Cardiovascular Disease

## 2014-06-27 ENCOUNTER — Encounter: Payer: 59 | Admitting: Cardiovascular Disease

## 2014-07-01 NOTE — Telephone Encounter (Signed)
Closed encounter °

## 2014-07-03 ENCOUNTER — Ambulatory Visit: Payer: 59 | Admitting: Family Medicine

## 2014-07-21 ENCOUNTER — Other Ambulatory Visit: Payer: Self-pay | Admitting: Cardiovascular Disease

## 2014-07-22 NOTE — Telephone Encounter (Signed)
Rx was sent to pharmacy electronically. 

## 2014-08-01 ENCOUNTER — Other Ambulatory Visit: Payer: Self-pay | Admitting: Cardiovascular Disease

## 2014-08-01 NOTE — Telephone Encounter (Signed)
Rx was sent to pharmacy electronically. 

## 2014-08-10 ENCOUNTER — Other Ambulatory Visit: Payer: Self-pay | Admitting: Cardiovascular Disease

## 2014-08-11 ENCOUNTER — Other Ambulatory Visit: Payer: Self-pay | Admitting: Cardiovascular Disease

## 2014-08-12 NOTE — Telephone Encounter (Signed)
Rx was sent to pharmacy electronically. 

## 2014-08-13 NOTE — Telephone Encounter (Signed)
Rx was sent to pharmacy electronically. 

## 2014-08-23 ENCOUNTER — Encounter: Payer: Self-pay | Admitting: Cardiovascular Disease

## 2014-08-26 ENCOUNTER — Ambulatory Visit: Payer: 59 | Attending: Family Medicine | Admitting: Physical Therapy

## 2014-08-26 DIAGNOSIS — M25569 Pain in unspecified knee: Secondary | ICD-10-CM | POA: Diagnosis present

## 2014-08-26 DIAGNOSIS — M25561 Pain in right knee: Secondary | ICD-10-CM | POA: Insufficient documentation

## 2014-08-26 DIAGNOSIS — M25562 Pain in left knee: Secondary | ICD-10-CM | POA: Insufficient documentation

## 2014-08-28 ENCOUNTER — Ambulatory Visit: Payer: 59 | Admitting: Physical Therapy

## 2014-08-28 DIAGNOSIS — M25562 Pain in left knee: Secondary | ICD-10-CM | POA: Diagnosis not present

## 2014-09-02 ENCOUNTER — Ambulatory Visit: Payer: 59 | Admitting: Physical Therapy

## 2014-09-02 DIAGNOSIS — M25562 Pain in left knee: Secondary | ICD-10-CM | POA: Diagnosis not present

## 2014-09-03 ENCOUNTER — Ambulatory Visit (INDEPENDENT_AMBULATORY_CARE_PROVIDER_SITE_OTHER): Payer: 59 | Admitting: Cardiovascular Disease

## 2014-09-03 ENCOUNTER — Encounter: Payer: Self-pay | Admitting: Cardiovascular Disease

## 2014-09-03 VITALS — BP 142/64 | HR 60 | Resp 16 | Ht 69.0 in | Wt 197.6 lb

## 2014-09-03 DIAGNOSIS — I25810 Atherosclerosis of coronary artery bypass graft(s) without angina pectoris: Secondary | ICD-10-CM

## 2014-09-03 DIAGNOSIS — I4891 Unspecified atrial fibrillation: Secondary | ICD-10-CM

## 2014-09-03 DIAGNOSIS — I10 Essential (primary) hypertension: Secondary | ICD-10-CM

## 2014-09-03 DIAGNOSIS — I5042 Chronic combined systolic (congestive) and diastolic (congestive) heart failure: Secondary | ICD-10-CM

## 2014-09-03 DIAGNOSIS — I442 Atrioventricular block, complete: Secondary | ICD-10-CM

## 2014-09-03 DIAGNOSIS — I255 Ischemic cardiomyopathy: Secondary | ICD-10-CM

## 2014-09-03 DIAGNOSIS — Z950 Presence of cardiac pacemaker: Secondary | ICD-10-CM

## 2014-09-03 LAB — MDC_IDC_ENUM_SESS_TYPE_INCLINIC
Date Time Interrogation Session: 20151117100408
Implantable Pulse Generator Model: 5826
Implantable Pulse Generator Serial Number: 1247203
Lead Channel Impedance Value: 359 Ohm
Lead Channel Impedance Value: 438 Ohm
Lead Channel Pacing Threshold Amplitude: 0.75 V
Lead Channel Pacing Threshold Pulse Width: 0.4 ms
Lead Channel Setting Pacing Amplitude: 2 V
Lead Channel Setting Sensing Sensitivity: 4 mV
MDC IDC MSMT BATTERY IMPEDANCE: 1900 Ohm
MDC IDC MSMT BATTERY VOLTAGE: 2.75 V
MDC IDC MSMT LEADCHNL RA PACING THRESHOLD AMPLITUDE: 0.5 V
MDC IDC MSMT LEADCHNL RA PACING THRESHOLD PULSEWIDTH: 0.5 ms
MDC IDC MSMT LEADCHNL RA SENSING INTR AMPL: 2.3 mV
MDC IDC SET LEADCHNL RV PACING PULSEWIDTH: 0.5 ms

## 2014-09-03 NOTE — Progress Notes (Signed)
Patient ID: [id number] ESKEW, male   DOB: 1931/06/16, 80 y.o.   MRN: [id number]      Reason for office visit CHF, pacemaker followup (complete heart block), paroxysmal atrial fibrillation    Todd Kelly has a lengthy and complicated cardiac history including coronary disease (coronary bypass surgery 2005, last cardiac cath 2009, low risk nuclear stress test 2011), moderate ischemic cardiomyopathy with a left ventricular ejection fraction of 35%, complete heart block status post dual-chamber pacemaker implantation (2009, [employer] [place]), congestive heart failure with episode of acute exacerbation roughly 9 months ago, current paroxysmal atrial fibrillation.  He was recently diagnosed with iron deficiency anemia. He has NYHA functional class 2-3 status. Feels best when his weight is around 190 pounds and takes torsemide only when his weight exceeds 193 pounds. Our office scale appears to overestimate his home weight by about 6-7 pounds.  Weighs considerably more than usual because he stopped his diuretics for a couple of days to allow for his activities. Denies change in dyspnea pattern and has minimal ankle edema. Denies angina, presyncope or palpitations.  Presents with AV sequential pacing, since last check has 95% A pacing and V pacing. Only mode switch episode was a 6 beat run of atrial tachycardia, 160 bpm.    Allergies  Allergen Reactions  . Penicillins Anaphylaxis and Hives    REACTION: Allergic to PCN w/ throat swelling \\T \ hives    Current Outpatient Prescriptions  Medication Sig Dispense Refill  . ALPRAZolam (XANAX) 0.5 MG tablet Take 1 tablet (0.5 mg total) by mouth 3 (three) times daily as needed for anxiety. Not to exceed 3 per day. 270 tablet 1  . amiodarone (PACERONE) 200 MG tablet Take 1 tablet (200 mg total) by mouth daily. 90 tablet 2  . aspirin 81 MG tablet Take 81 mg by mouth daily.      [name] atorvastatin (LIPITOR) 40 MG tablet TAKE 1 TABLET DAILY 90 tablet 2  .  budesonide-formoterol (SYMBICORT) 160-4.5 MCG/ACT inhaler Inhale 2 puffs into the lungs 2 (two) times daily. 3 Inhaler 3  . Coenzyme Q10 (COQ10) 100 MG CAPS Take 100 mg by mouth daily.     [name] docusate sodium (COLACE) 100 MG capsule Take 200 mg by mouth daily.     . ferrous sulfate 325 (65 FE) MG tablet Take 325 mg by mouth daily with breakfast.    . fluticasone (FLONASE) 50 MCG/ACT nasal spray Place 1 spray into both nostrils 2 (two) times daily. 48 g 3  . furosemide (LASIX) 40 MG tablet Take 1 tablet (40 mg total) by mouth daily. 90 tablet 3  . guaiFENesin (MUCINEX) 600 MG 12 hr tablet Take 2 tablets (1,200 mg total) by mouth 2 (two) times daily as needed for congestion.    [name] HYDROcodone-acetaminophen (NORCO/VICODIN) 5-325 MG per tablet Take 1/2 to 1 tablet by mouth three times daily as needed for pain 90 tablet 0  . losartan (COZAAR) 100 MG tablet Take 1 tablet (100 mg total) by mouth at bedtime. 90 tablet 2  . meclizine (ANTIVERT) 25 MG tablet Take 1/2 to 1 tablet by mouth every 6 hours as needed for dizziness 90 tablet 3  . metoprolol tartrate (LOPRESSOR) 25 MG tablet Take 1 tablet (25 mg total) by mouth 2 (two) times daily. 90 tablet 3  . montelukast (SINGULAIR) 10 MG tablet TAKE 1 TABLET AT BEDTIME 90 tablet 3  . Omega-3 Fatty Acids (FISH OIL) 1000 MG CAPS Take 1 capsule by mouth daily.      [name]  polyethylene glycol (MIRALAX / GLYCOLAX) packet Take 17 g by mouth daily as needed.     . potassium chloride SA (K-DUR,KLOR-CON) 20 MEQ tablet TAKE 1 TABLET DAILY 90 tablet 2  . predniSONE (DELTASONE) 5 MG tablet Take 5 mg by mouth daily.     . ranitidine (ZANTAC) 150 MG capsule Take 1 capsule (150 mg total) by mouth 2 (two) times daily. 180 capsule 3  . tiotropium (SPIRIVA HANDIHALER) 18 MCG inhalation capsule INHALE THE CONTENTS OF 1 CAPSULE DAILY 90 capsule 3  . vitamin B-12 (CYANOCOBALAMIN) 500 MCG tablet Take 500 mcg by mouth daily.    . vitamin C (ASCORBIC ACID) 500 MG tablet Take 500 mg by mouth  daily.    Alveda Reasons 20 MG TABS tablet TAKE 1 TABLET DAILY WITH SUPPER 90 tablet 2   No current facility-administered medications for this visit.   Facility-Administered Medications Ordered in Other Visits  Medication Dose Route Frequency Provider Last Rate Last Dose  . 0.9 %  sodium chloride infusion    Continuous PRN Durene Romans, CRNA        Past Medical History  Diagnosis Date  . OSA (obstructive sleep apnea)   . Bronchial pneumonia   . Bronchitis, chronic with acute exacerbation   . COPD (chronic obstructive pulmonary disease)   . Hypertension   . Arteriosclerotic heart disease   . Atrial fibrillation   . Cardiac pacemaker in situ 04/12/2008    [place]  . Cerebrovascular disease   . Peripheral vascular disease   . Hypercholesteremia   . Obesity   . Diverticulosis of colon   . Adenomatous polyp of colon 08/1994  . Hemorrhoids   . DJD (degenerative joint disease)   . Chronic low back pain   . Anxiety   . CHF (congestive heart failure)   . CHB (complete heart block)   . Carotid arterial disease   . CAD (coronary artery disease)   . S/P CABG (coronary artery bypass graft) 2005    Past Surgical History  Procedure Laterality Date  . Pta to right common femoral artery  2001    Dr. Lyndel Safe  . Carotid endarterectomy  2002    bilateral.  Dr. Kellie Simmering  . Coronary artery bypass graft  2005  . Pacemaker placement  July 2009    for CHB  Dr. Gwenlyn Found. [employer]  . Appendectomy  1948  . US echocardiography  11/06/2011    Mod. LVH w/mod. depressed systolic function,EF 36-62%,HUTML LV hypokinesis,mildly dilated LA,mild aortic root dilatation  . Nm myoview ltd  09/04/2010    mild perfusion defect basal inferior,mid inferior, & apical inferior regions. LV systolic function deteriorated since 2006  . Cardiac catheterization  02/02/2008    patent grafts    Family History  Problem Relation Age of Onset  . Asthma Paternal Uncle   . Heart disease Paternal Grandmother   .  Heart disease Mother   . Heart attack Father   . Rheum arthritis Father   . Rheum arthritis Paternal Uncle   . Pancreatic cancer Mother   . Hyperlipidemia Mother     History   Social History  . Marital Status: Married    Spouse Name: Enid Derry    Number of Children: N  . Years of Education: N/A   Occupational History  . [employer]     retired  . DRIVER    Social History Main Topics  . Smoking status: Former Smoker -- 1.50 packs/day for 54 years  Types: Cigarettes    Quit date: 10/18/1996  . Smokeless tobacco: Former Systems developer    Types: Chew     Comment: only used chewing tobacco while working in the yard a couple of times  . Alcohol Use: No  . Drug Use: No  . Sexual Activity: Not on file   Other Topics Concern  . Not on file   Social History Narrative   No alcohol.   Drinks 1 cup of caffeine.    Review of systems: The patient specifically denies any chest pain at rest or with usual exertion, dyspnea at rest or with exertion, orthopnea, paroxysmal nocturnal dyspnea, syncope, palpitations, focal neurological deficits, intermittent claudication, lower extremity edema, unexplained weight gain, cough, hemoptysis or wheezing.  The patient also denies abdominal pain, nausea, vomiting, dysphagia, diarrhea, constipation, polyuria, polydipsia, dysuria, hematuria, frequency, urgency, abnormal bleeding or bruising, fever, chills, unexpected weight changes, mood swings, change in skin or hair texture, change in voice quality, auditory or visual problems, allergic reactions or rashes, new musculoskeletal complaints other than usual "aches and pains".  PHYSICAL EXAM BP 142/64 mmHg  Pulse 60  Resp 16  Ht 5\' 9"  (1.753 m)  Wt 197 lb 9.6 oz (89.631 kg)  BMI 29.17 kg/m2 General: Alert, oriented x3, no distress  Head: no evidence of trauma, PERRL, EOMI, no exophtalmos or lid lag, no myxedema, no xanthelasma; normal ears, nose and oropharynx  Neck: normal jugular venous  pulsations and no hepatojugular reflux; brisk carotid pulses without delay and no carotid bruits  Chest: Emphysematous chest, diminished breath sounds throughout but otherwise clear to auscultation, no signs of consolidation by percussion or palpation, normal fremitus, symmetrical and full respiratory excursions; healthy subclavian pacemaker site  Cardiovascular: normal position and quality of the apical impulse, regular rhythm, normal first and paradoxically split second heart sounds, no murmurs, rubs or gallops  Abdomen: no tenderness or distention, no masses by palpation, no abnormal pulsatility or arterial bruits, normal bowel sounds, no hepatosplenomegaly  Extremities: no clubbing, cyanosis or edema; 2+ radial, ulnar and brachial pulses bilaterally; 2+ right femoral, posterior tibial and dorsalis pedis pulses; 2+ left femoral, posterior tibial and dorsalis pedis pulses; no subclavian or femoral bruits  Neurological: grossly nonfocal  EKG: Atrial ventricular sequential paced   Lipid Panel     Component Value Date/Time   CHOL 108 11/02/2013 0756   TRIG 70.0 11/02/2013 0756   HDL 39.30 11/02/2013 0756   CHOLHDL 3 11/02/2013 0756   VLDL 14.0 11/02/2013 0756   LDLCALC 55 11/02/2013 0756   LDLDIRECT 60.7 10/07/2009 0803    BMET    Component Value Date/Time   NA 142 11/02/2013 0756   K 4.0 11/02/2013 0756   CL 107 11/02/2013 0756   CO2 28 11/02/2013 0756   GLUCOSE 85 11/02/2013 0756   GLUCOSE 109* 09/22/2006 1133   BUN 34* 11/02/2013 0756   CREATININE 1.4 11/02/2013 0756   CREATININE 1.19 09/14/2013 1652   CALCIUM 8.8 11/02/2013 0756   GFRNONAA 78* 08/14/2013 0600   GFRAA >90 08/14/2013 0600     ASSESSMENT AND PLAN CAD s/p CABG '05, patent grafts 4/09, low risk Nuc 2011  CABG 2005, Dr. Servando Snare, LIMA to LAD, SVG to intermediate, SVG to second intermedius. Angiography 2009 shows all grafts patent, nondominant right coronary artery sequential moderate stenoses in the AV  groove portion left circumflex coronary artery.  Currently asymptomatic. No change in therapy planned.   Cardiomyopathy, ischemic- EF 35%  Currently NYHA functional class II. Dry weight seems to  be around 190 pounds (home scale), higher than usual today because he skipped a few diuretic doses. Continue sodium restriction and daily weights. He is on appropriate treatment with a full dose of angiotensin receptor blocker. Will not increase the beta blocker dose because of his reactive airway disease.   Complete heart block pacemaker dependent  Cardiac pacemaker in situ- [employer] for CHB 7/09- pacer dependednt  Normal device function. Continue with checks in office at least every 6 months. He could well benefit from upgrade of his device to a biventricular pacemaker (CRT-P), but prefers nonsurgical management as long as he feels well.   PAF- Amiodarone added 10/14  No recent episodes. Atrial fibrillation seems to have been part of his heart failure decompensation, but retrospectively one wonders whether the anemia was also an important factor. Will require periodic liver function tests and thyroid function tests while on amiodarone therapy. The benefit of permanent anticoagulation needs to be weighed against the risk of bleeding and anemia. His anemia seems to be markedly improved.   HYPERCHOLESTEROLEMIA  Excellent recent lipid profile.   PVD - CEA '02, RCFA PTA '01  Denies intermittent claudication or any neurological symptoms. I don't think his dizziness can be explained on a cardiovascular basis either. It is definitely not arrhythmia related. Orders Placed This Encounter  Procedures  . Implantable device check  . EKG 12-Lead    Eletha Culbertson  Sanda Klein, MD, [facility] [phone] office [phone] pager

## 2014-09-03 NOTE — Patient Instructions (Signed)
Your physician recommends that you schedule a follow-up appointment in: 6 months with Dr.Croitoru + pacemaker check

## 2014-09-04 ENCOUNTER — Ambulatory Visit: Payer: 59 | Admitting: Physical Therapy

## 2014-09-04 DIAGNOSIS — M25562 Pain in left knee: Secondary | ICD-10-CM | POA: Diagnosis not present

## 2014-09-09 ENCOUNTER — Ambulatory Visit: Payer: 59 | Admitting: Physical Therapy

## 2014-09-09 DIAGNOSIS — M25562 Pain in left knee: Secondary | ICD-10-CM | POA: Diagnosis not present

## 2014-09-11 ENCOUNTER — Ambulatory Visit: Payer: 59

## 2014-09-11 ENCOUNTER — Other Ambulatory Visit: Payer: Self-pay | Admitting: Cardiovascular Disease

## 2014-09-11 DIAGNOSIS — M25562 Pain in left knee: Secondary | ICD-10-CM | POA: Diagnosis not present

## 2014-09-16 ENCOUNTER — Ambulatory Visit: Payer: 59 | Admitting: Physical Therapy

## 2014-09-16 DIAGNOSIS — M25562 Pain in left knee: Secondary | ICD-10-CM | POA: Diagnosis not present

## 2014-09-18 ENCOUNTER — Ambulatory Visit: Payer: 59 | Attending: Family Medicine | Admitting: Physical Therapy

## 2014-09-18 DIAGNOSIS — M25569 Pain in unspecified knee: Secondary | ICD-10-CM | POA: Diagnosis present

## 2014-09-18 DIAGNOSIS — M25562 Pain in left knee: Secondary | ICD-10-CM | POA: Diagnosis not present

## 2014-09-18 DIAGNOSIS — M25561 Pain in right knee: Secondary | ICD-10-CM | POA: Insufficient documentation

## 2014-09-21 ENCOUNTER — Other Ambulatory Visit: Payer: Self-pay | Admitting: Pulmonary Disease

## 2014-09-23 ENCOUNTER — Ambulatory Visit: Payer: 59 | Admitting: Physical Therapy

## 2014-09-23 ENCOUNTER — Other Ambulatory Visit: Payer: Self-pay | Admitting: Cardiovascular Disease

## 2014-09-23 DIAGNOSIS — M25562 Pain in left knee: Secondary | ICD-10-CM | POA: Diagnosis not present

## 2014-09-24 NOTE — Telephone Encounter (Signed)
Rx was sent to pharmacy electronically. 

## 2014-09-25 ENCOUNTER — Ambulatory Visit: Payer: 59 | Admitting: Physical Therapy

## 2014-09-25 DIAGNOSIS — M25562 Pain in left knee: Secondary | ICD-10-CM | POA: Diagnosis not present

## 2014-09-30 ENCOUNTER — Ambulatory Visit: Payer: 59 | Admitting: Physical Therapy

## 2014-09-30 DIAGNOSIS — M25562 Pain in left knee: Secondary | ICD-10-CM | POA: Diagnosis not present

## 2014-10-02 ENCOUNTER — Ambulatory Visit: Payer: 59

## 2014-10-02 DIAGNOSIS — M25562 Pain in left knee: Secondary | ICD-10-CM | POA: Diagnosis not present

## 2014-10-07 ENCOUNTER — Encounter: Payer: Self-pay | Admitting: Cardiovascular Disease

## 2014-10-14 ENCOUNTER — Other Ambulatory Visit: Payer: Self-pay | Admitting: Pulmonary Disease

## 2014-11-08 ENCOUNTER — Other Ambulatory Visit: Payer: Self-pay | Admitting: Cardiovascular Disease

## 2014-11-10 ENCOUNTER — Other Ambulatory Visit: Payer: Self-pay | Admitting: Pulmonary Disease

## 2014-11-11 ENCOUNTER — Other Ambulatory Visit: Payer: Self-pay | Admitting: Cardiovascular Disease

## 2014-11-11 MED ORDER — RIVAROXABAN 20 MG PO TABS: 20.0000 mg | ORAL_TABLET | Freq: Every day | ORAL | Status: DC

## 2014-11-11 NOTE — Telephone Encounter (Signed)
Submitted refill

## 2014-11-11 NOTE — Telephone Encounter (Signed)
[facility] said [facility] sent a fax for a cover determination for his Xarelto.Need a 90 days supply with 3 refills.

## 2014-11-14 ENCOUNTER — Telehealth: Payer: Self-pay | Admitting: *Deleted

## 2014-11-14 NOTE — Telephone Encounter (Signed)
PA faxed to [facility] for Xarelto.

## 2014-11-15 ENCOUNTER — Telehealth: Payer: Self-pay | Admitting: Cardiovascular Disease

## 2014-11-15 NOTE — Telephone Encounter (Signed)
Pt's wife called in stating that a prior authorization is needed to refill pt's Xarelto prescription ,which he is out of. She gave me the case # for this issue which is [id number] and the number of Prior Authorizations is [phone]. She would like to be contacted once this matter has been taken care of.  Thanks

## 2014-11-15 NOTE — Telephone Encounter (Signed)
Saw PA was submitted yesterday, called pt's wife, she states the last thing she was told by [facility] was that a PA had been denied. She is requesting update from [place] on status.

## 2014-11-18 ENCOUNTER — Telehealth: Payer: Self-pay | Admitting: *Deleted

## 2014-11-18 NOTE — Telephone Encounter (Signed)
Reply back from [facility] states patient is not eligible at [facility].  Called and spoke with wife who states they are covered by [facility].  Number on card to call [phone]  ID # [id number].

## 2014-11-18 NOTE — Telephone Encounter (Signed)
2nd PA sent for Xarelto to [facility].  Unable to reach by phone - tried off and on all day starting at 7:30am on hold x 45 mintues.

## 2014-11-18 NOTE — Telephone Encounter (Signed)
Can this encounter be closed?

## 2014-11-18 NOTE — Telephone Encounter (Signed)
Pt's wife called in stating that the pt's refill request was denied because the form was not received on time and all that needs to be done is a verbal order to fix the problem. Please follow up  Thanks

## 2014-11-20 MED ORDER — RIVAROXABAN 20 MG PO TABS: 20.0000 mg | ORAL_TABLET | Freq: Every day | ORAL | Status: DC

## 2014-11-20 NOTE — Telephone Encounter (Signed)
[facility] has approved Xarelto 10/20/2014 - 11/19/2015.  Wife notified.  New Rx sent to [facility].

## 2014-11-20 NOTE — Telephone Encounter (Signed)
Xarelto has been APPROVED 10/20/2014 - 11/19/2015.  [facility].  Wife notified.

## 2014-11-29 IMAGING — CR DG CHEST 2V
2 series · 2 of 2 positions shown · non-contrast
Comparison: Chest x-ray of 01/21/2011

CLINICAL DATA: COPD, former smoking history

CHEST - 2 VIEW

[view not recorded (1 of 2)]
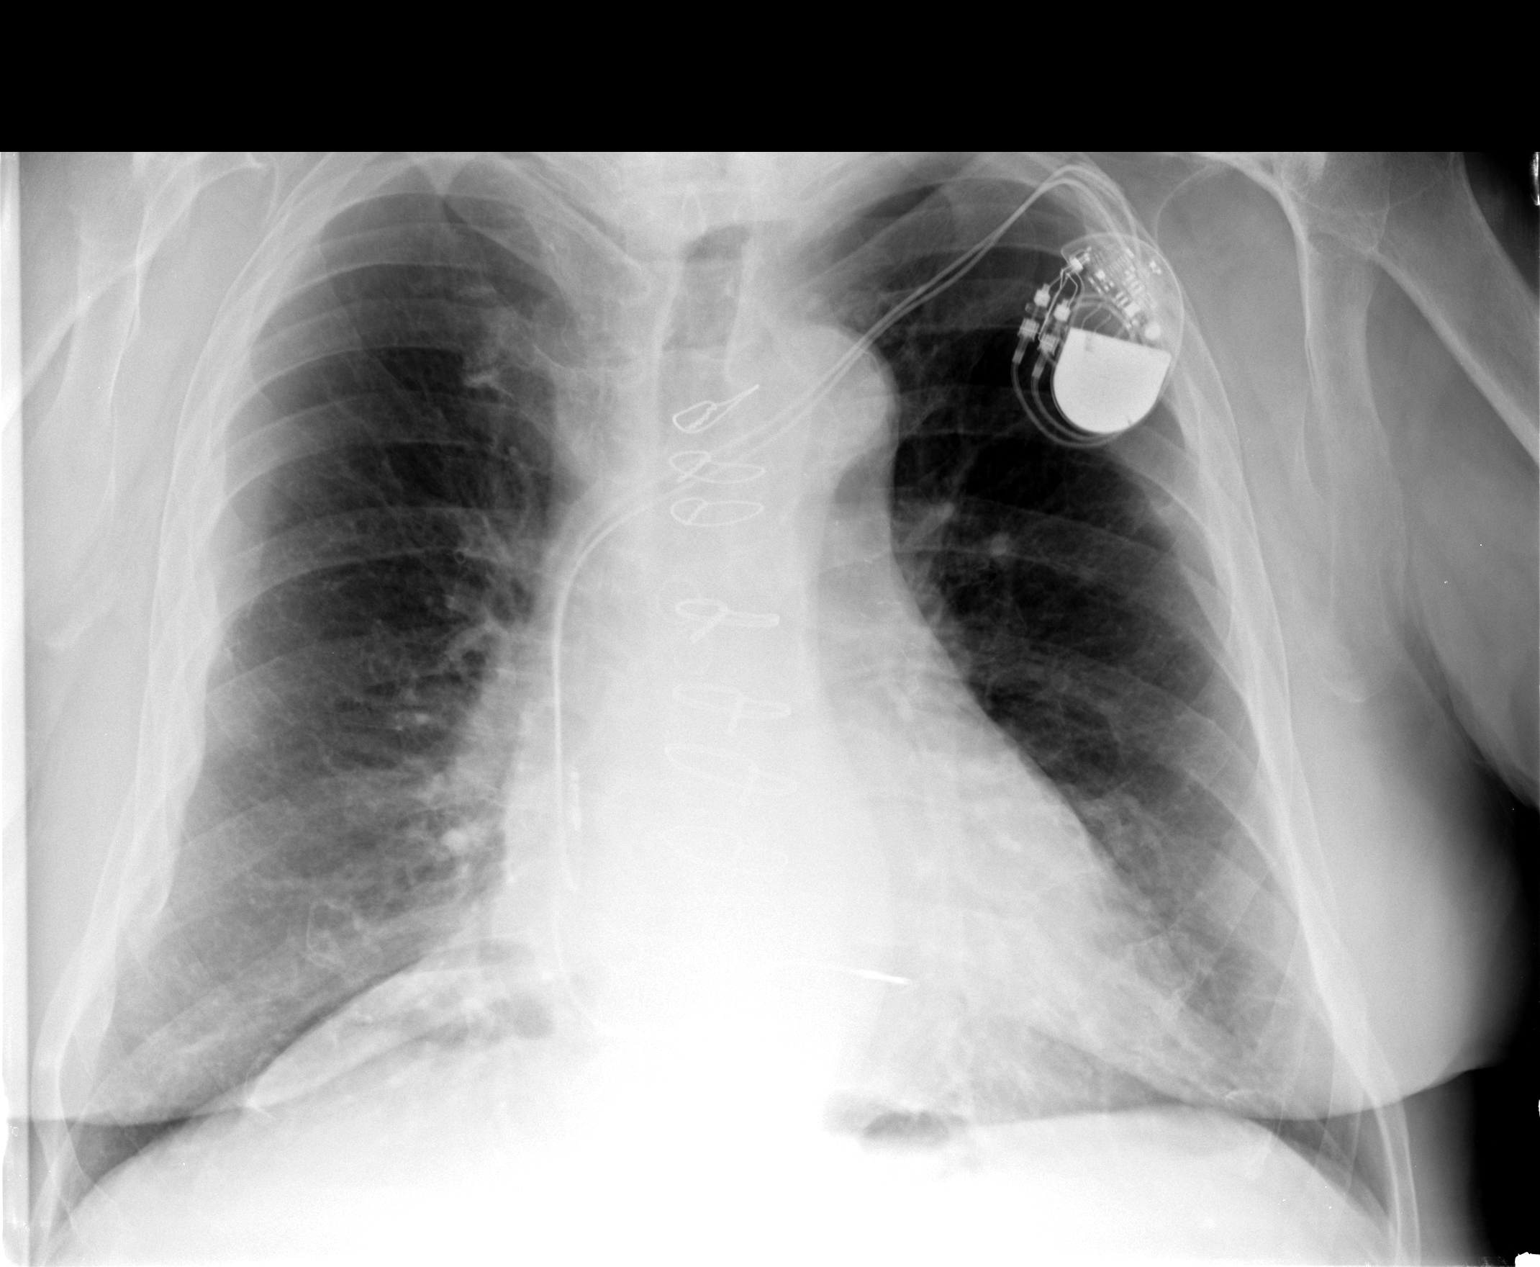

[view not recorded (2 of 2)]
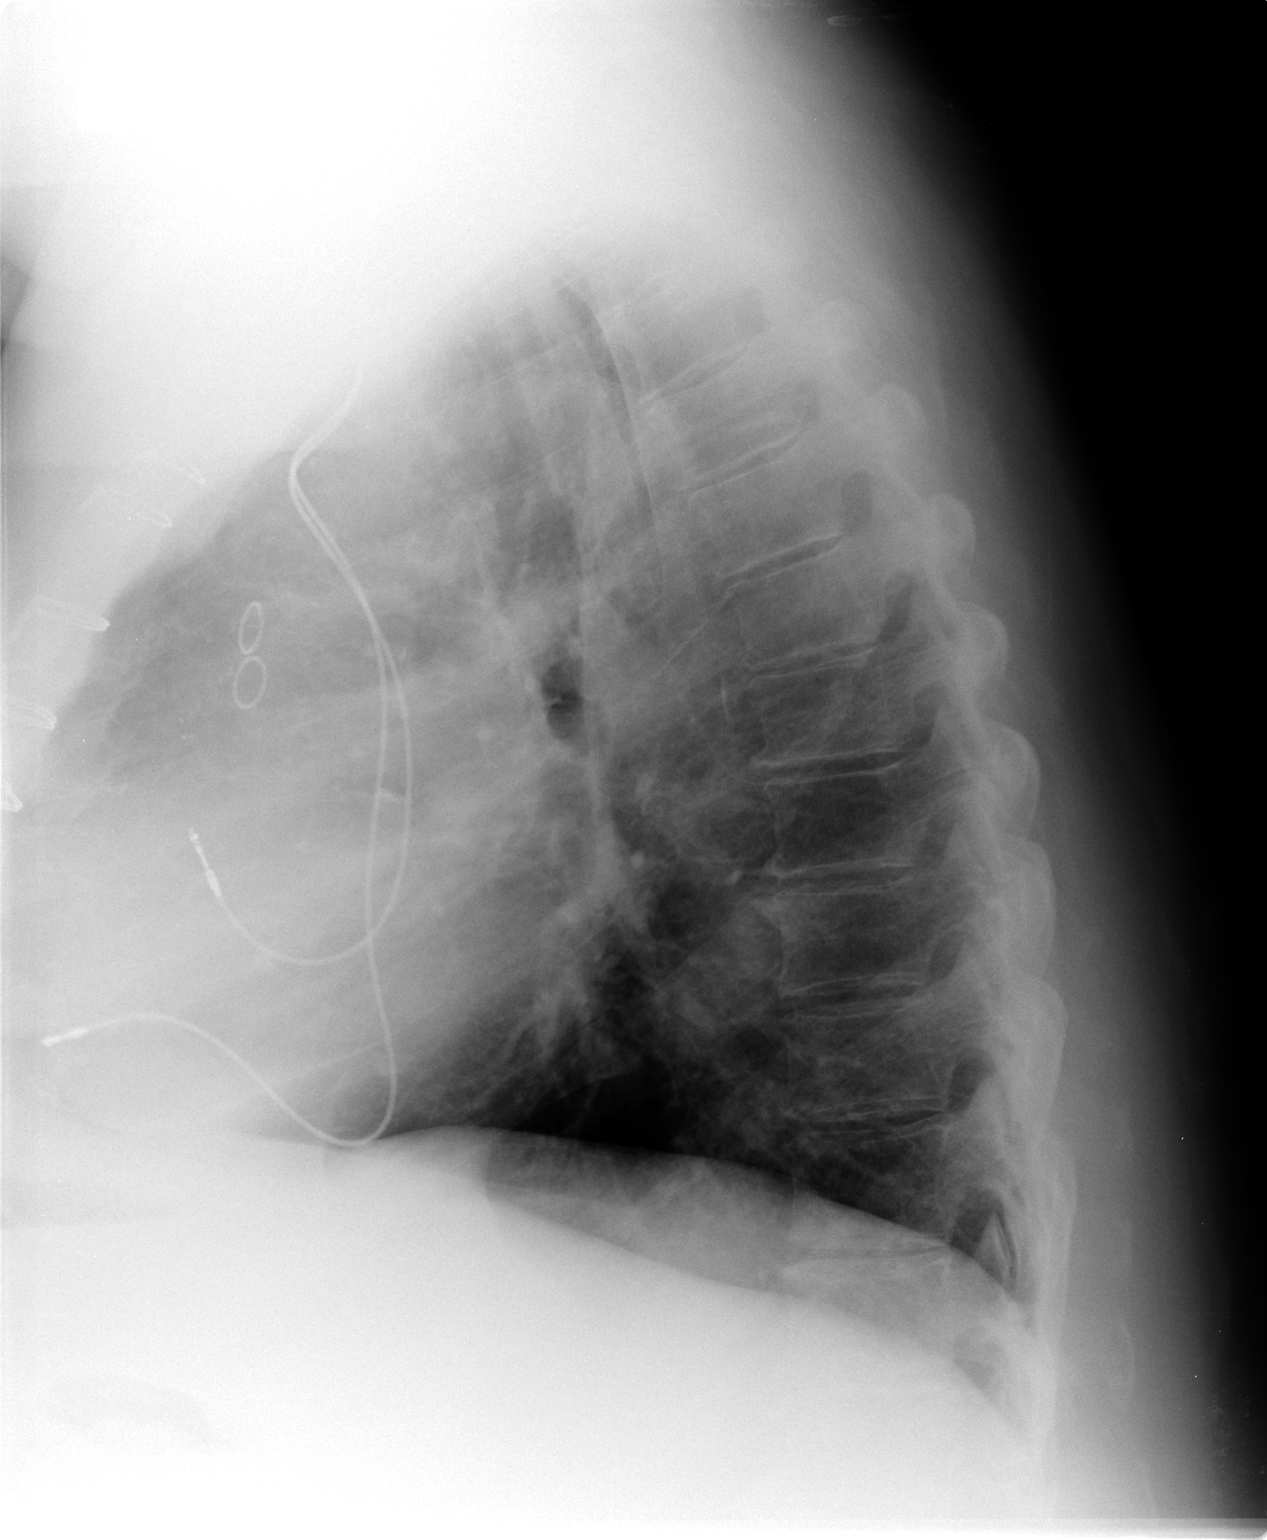

[2 of 2 positions shown; findings below may reference images not displayed]

FINDINGS: No active infiltrate or effusion is seen.  The lungs
remain somewhat hyperaerated.  Moderate cardiomegaly is stable and
a permanent pacemaker remains.  Median sternotomy sutures are
intact from prior CABG.  There are mild degenerative changes of
lower lumbar spine.
IMPRESSION: Stable chest x-ray with hyperaeration.  Stable cardiomegaly and
permanent pacemaker.

## 2015-01-14 ENCOUNTER — Ambulatory Visit (INDEPENDENT_AMBULATORY_CARE_PROVIDER_SITE_OTHER): Payer: 59 | Admitting: Cardiovascular Disease

## 2015-01-14 ENCOUNTER — Encounter: Payer: Self-pay | Admitting: Cardiovascular Disease

## 2015-01-14 VITALS — BP 142/78 | HR 61 | Resp 22 | Ht 68.5 in | Wt 195.5 lb

## 2015-01-14 DIAGNOSIS — I25810 Atherosclerosis of coronary artery bypass graft(s) without angina pectoris: Secondary | ICD-10-CM | POA: Diagnosis not present

## 2015-01-14 DIAGNOSIS — I442 Atrioventricular block, complete: Secondary | ICD-10-CM | POA: Diagnosis not present

## 2015-01-14 DIAGNOSIS — E663 Overweight: Secondary | ICD-10-CM

## 2015-01-14 DIAGNOSIS — Z950 Presence of cardiac pacemaker: Secondary | ICD-10-CM

## 2015-01-14 DIAGNOSIS — I4891 Unspecified atrial fibrillation: Secondary | ICD-10-CM

## 2015-01-14 DIAGNOSIS — Z79899 Other long term (current) drug therapy: Secondary | ICD-10-CM | POA: Diagnosis not present

## 2015-01-14 DIAGNOSIS — I5042 Chronic combined systolic (congestive) and diastolic (congestive) heart failure: Secondary | ICD-10-CM

## 2015-01-14 DIAGNOSIS — I255 Ischemic cardiomyopathy: Secondary | ICD-10-CM

## 2015-01-14 DIAGNOSIS — I10 Essential (primary) hypertension: Secondary | ICD-10-CM

## 2015-01-14 LAB — MDC_IDC_ENUM_SESS_TYPE_INCLINIC
Battery Impedance: 2200 Ohm
Battery Voltage: 2.75 V
Brady Statistic RA Percent Paced: 99 %
Date Time Interrogation Session: 20160329084025
Implantable Pulse Generator Model: 5826
Implantable Pulse Generator Serial Number: 1247203
Lead Channel Impedance Value: 359 Ohm
Lead Channel Pacing Threshold Amplitude: 0.5 V
Lead Channel Pacing Threshold Amplitude: 0.875 V
Lead Channel Pacing Threshold Pulse Width: 0.5 ms
Lead Channel Setting Pacing Amplitude: 2 V
Lead Channel Setting Pacing Pulse Width: 0.5 ms
Lead Channel Setting Sensing Sensitivity: 4 mV
MDC IDC MSMT LEADCHNL RA PACING THRESHOLD PULSEWIDTH: 0.5 ms
MDC IDC MSMT LEADCHNL RA SENSING INTR AMPL: 2.5 mV
MDC IDC MSMT LEADCHNL RV IMPEDANCE VALUE: 423 Ohm
MDC IDC STAT BRADY RV PERCENT PACED: 99 % — AB

## 2015-01-14 LAB — TSH: TSH: 2.109 u[IU]/mL (ref 0.350–4.500)

## 2015-01-14 LAB — T4, FREE: Free T4: 1.24 ng/dL (ref 0.80–1.80)

## 2015-01-14 MED ORDER — AMIODARONE HCL 200 MG PO TABS: 200.0000 mg | ORAL_TABLET | Freq: Every day | ORAL | Status: AC

## 2015-01-14 MED ORDER — RIVAROXABAN 20 MG PO TABS: 20.0000 mg | ORAL_TABLET | Freq: Every day | ORAL | Status: AC

## 2015-01-14 MED ORDER — FUROSEMIDE 40 MG PO TABS: 40.0000 mg | ORAL_TABLET | Freq: Every day | ORAL | Status: AC

## 2015-01-14 MED ORDER — METOPROLOL TARTRATE 25 MG PO TABS: 25.0000 mg | ORAL_TABLET | Freq: Two times a day (BID) | ORAL | Status: AC

## 2015-01-14 MED ORDER — ATORVASTATIN CALCIUM 40 MG PO TABS: 40.0000 mg | ORAL_TABLET | Freq: Every day | ORAL | Status: DC

## 2015-01-14 MED ORDER — LOSARTAN POTASSIUM 100 MG PO TABS: 100.0000 mg | ORAL_TABLET | Freq: Every day | ORAL | Status: DC

## 2015-01-14 NOTE — Progress Notes (Signed)
Patient ID: Todd Kelly, male   DOB: [birth year], 81 y.o.   MRN: [id number]     Cardiology Office Note   Date:  01/16/2015   ID:  Todd Kelly, DOB 10-Dec-1930, MRN [id number]  PCP:  Todd Bellows, MD  Cardiologist:   Todd Klein, MD   Chief Complaint  Patient presents with  . Follow-up    No chest pain or edema.  SOB with minimal activity.  Occas. lightheadedness treated with Meclizine.      History of Present Illness: Todd Kelly is a 81 y.o. male who presents for chronic systolic HF due to CAD/ischemic cardiomyopathy, pacemaker followup (complete heart block), paroxysmal atrial fibrillation.  Todd Kelly has a lengthy and complicated cardiac history including coronary disease (coronary bypass surgery 2005, last cardiac cath 2009, low risk nuclear stress test 2011), moderate ischemic cardiomyopathy with a left ventricular ejection fraction of 35%, complete heart block status post dual-chamber pacemaker implantation (2009, [employer] [place]), congestive heart failure with episode of acute exacerbation roughly 1 year ago, recurrent paroxysmal atrial fibrillation on amiodarone.  He has iron deficiency anemia on Xarelto and Fe supplements. He has NYHA functional class 2-3 status. Feels best when his weight is around 190 pounds and takes torsemide only when his weight exceeds 193 pounds. Our office scale appears to overestimate his home weight by about 4-6 pounds.   Today at home he weighed 191.5 lb. His dyspnea is at baseline. He does not have edema or angina. No dizziness or syncope.  [place] (2009) pacemaker function is normal. He does not have any ventricular escape rhythm - truly pacemaker dependent. 100% atrial paced-ventricular paced rhythm with only one 6 second episode of mode switch in the last 6 months  Past Medical History  Diagnosis Date  . OSA (obstructive sleep apnea)   . Bronchial pneumonia   . Bronchitis, chronic with acute exacerbation   . COPD  (chronic obstructive pulmonary disease)   . Hypertension   . Arteriosclerotic heart disease   . Atrial fibrillation   . Cardiac pacemaker in situ 04/12/2008    [place]  . Cerebrovascular disease   . Peripheral vascular disease   . Hypercholesteremia   . Obesity   . Diverticulosis of colon   . Adenomatous polyp of colon 08/1994  . Hemorrhoids   . DJD (degenerative joint disease)   . Chronic low back pain   . Anxiety   . CHF (congestive heart failure)   . CHB (complete heart block)   . Carotid arterial disease   . CAD (coronary artery disease)   . S/P CABG (coronary artery bypass graft) 2005    Past Surgical History  Procedure Laterality Date  . Pta to right common femoral artery  2001    Dr. Lyndel Kelly  . Carotid endarterectomy  2002    bilateral.  Dr. Kellie Kelly  . Coronary artery bypass graft  2005  . Pacemaker placement  July 2009    for CHB  Dr. Gwenlyn Kelly. [employer]  . Appendectomy  1948  . US echocardiography  11/06/2011    Mod. LVH w/mod. depressed systolic function,EF 33-82%,NKNLZ LV hypokinesis,mildly dilated LA,mild aortic root dilatation  . Nm myoview ltd  09/04/2010    mild perfusion defect basal inferior,mid inferior, & apical inferior regions. LV systolic function deteriorated since 2006  . Cardiac catheterization  02/02/2008    patent grafts     Current Outpatient Prescriptions  Medication Sig Dispense Refill  . ALPRAZolam (XANAX) 0.5 MG tablet  Take 1 tablet (0.5 mg total) by mouth 3 (three) times daily as needed for anxiety. Not to exceed 3 per day. 270 tablet 1  . amiodarone (PACERONE) 200 MG tablet Take 1 tablet (200 mg total) by mouth daily. 90 tablet 3  . atorvastatin (LIPITOR) 40 MG tablet Take 1 tablet (40 mg total) by mouth daily. 90 tablet 3  . Coenzyme Q10 (COQ10) 100 MG CAPS Take 100 mg by mouth daily.     [name] docusate sodium (COLACE) 100 MG capsule Take 200 mg by mouth daily.     . ferrous sulfate 325 (65 FE) MG tablet Take 325 mg by mouth daily with  breakfast.    . fluticasone (FLONASE) 50 MCG/ACT nasal spray USE ONE SPRAY IN EACH NOSTRIL TWICE A DAY 52 g 2  . furosemide (LASIX) 40 MG tablet Take 1 tablet (40 mg total) by mouth daily. 90 tablet 3  . guaiFENesin (MUCINEX) 600 MG 12 hr tablet Take 2 tablets (1,200 mg total) by mouth 2 (two) times daily as needed for congestion.    [name] HYDROcodone-acetaminophen (NORCO/VICODIN) 5-325 MG per tablet Take 1/2 to 1 tablet by mouth three times daily as needed for pain 90 tablet 0  . losartan (COZAAR) 100 MG tablet Take 1 tablet (100 mg total) by mouth at bedtime. 90 tablet 3  . meclizine (ANTIVERT) 25 MG tablet Take 1/2 to 1 tablet by mouth every 6 hours as needed for dizziness 90 tablet 3  . metoprolol tartrate (LOPRESSOR) 25 MG tablet Take 1 tablet (25 mg total) by mouth 2 (two) times daily. 180 tablet 3  . montelukast (SINGULAIR) 10 MG tablet TAKE 1 TABLET AT BEDTIME 90 tablet 2  . Omega-3 Fatty Acids (FISH OIL) 1000 MG CAPS Take 1 capsule by mouth daily.      . polyethylene glycol (MIRALAX / GLYCOLAX) packet Take 17 g by mouth daily as needed.     . potassium chloride SA (K-DUR,KLOR-CON) 20 MEQ tablet TAKE 1 TABLET DAILY 90 tablet 2  . predniSONE (DELTASONE) 5 MG tablet Take 5 mg by mouth daily.     . ranitidine (ZANTAC) 150 MG capsule Take 1 capsule (150 mg total) by mouth 2 (two) times daily. 180 capsule 3  . rivaroxaban (XARELTO) 20 MG TABS tablet Take 1 tablet (20 mg total) by mouth daily with supper. 90 tablet 3  . SYMBICORT 160-4.5 MCG/ACT inhaler INHALE 2 PUFFS INTO THE LUNGS TWICE A DAY 3 Inhaler 2  . tiotropium (SPIRIVA HANDIHALER) 18 MCG inhalation capsule INHALE THE CONTENTS OF 1 CAPSULE DAILY 90 capsule 3  . vitamin B-12 (CYANOCOBALAMIN) 500 MCG tablet Take 500 mcg by mouth daily.    . vitamin C (ASCORBIC ACID) 500 MG tablet Take 500 mg by mouth daily.     No current facility-administered medications for this visit.   Facility-Administered Medications Ordered in Other Visits    Medication Dose Route Frequency Provider Last Rate Last Dose  . 0.9 %  sodium chloride infusion    Continuous PRN Durene Romans, CRNA        Allergies:   Penicillins    Social History:  The patient  reports that he quit smoking about 18 years ago. His smoking use included Cigarettes. He has a 81 pack-year smoking history. He has quit using smokeless tobacco. His smokeless tobacco use included Chew. He reports that he does not drink alcohol or use illicit drugs.   Family History:  The patient's family history includes Asthma in his paternal  uncle; Heart attack in his father; Heart disease in his mother and paternal grandmother; Hyperlipidemia in his mother; Pancreatic cancer in his mother; Rheum arthritis in his father and paternal uncle.    ROS:  Please see the history of present illness.    The patient specifically denies any chest pain at rest or with usual exertion, dyspnea at rest or with exertion, orthopnea, paroxysmal nocturnal dyspnea, syncope, palpitations, focal neurological deficits, intermittent claudication, lower extremity edema, unexplained weight gain, cough, hemoptysis or wheezing.  The patient also denies abdominal pain, nausea, vomiting, dysphagia, diarrhea, constipation, polyuria, polydipsia, dysuria, hematuria, frequency, urgency, abnormal bleeding or bruising, fever, chills, unexpected weight changes, mood swings, change in skin or hair texture, change in voice quality, auditory or visual problems, allergic reactions or rashes, new musculoskeletal complaints other than usual "aches and pains". All other systems are reviewed and negative.    PHYSICAL EXAM: VS:  BP 142/78 mmHg  Pulse 61  Resp 22  Ht 5' 8.5" (1.74 m)  Wt 195 lb 8 oz (88.678 kg)  BMI 29.29 kg/m2 , BMI Body mass index is 29.29 kg/(m^2). General: Alert, oriented x3, no distress  Head: no evidence of trauma, PERRL, EOMI, no exophtalmos or lid lag, no myxedema, no xanthelasma; normal ears, nose and  oropharynx  Neck: normal jugular venous pulsations and no hepatojugular reflux; brisk carotid pulses without delay and no carotid bruits  Chest: Emphysematous chest, diminished breath sounds throughout but otherwise clear to auscultation, no signs of consolidation by percussion or palpation, normal fremitus, symmetrical and full respiratory excursions; healthy subclavian pacemaker site  Cardiovascular: normal position and quality of the apical impulse, regular rhythm, normal first and paradoxically split second heart sounds, no murmurs, rubs or gallops  Abdomen: no tenderness or distention, no masses by palpation, no abnormal pulsatility or arterial bruits, normal bowel sounds, no hepatosplenomegaly  Extremities: no clubbing, cyanosis or edema; 2+ radial, ulnar and brachial pulses bilaterally; 2+ right femoral, posterior tibial and dorsalis pedis pulses; 2+ left femoral, posterior tibial and dorsalis pedis pulses; no subclavian or femoral bruits  Neurological: grossly nonfocal Psych: euthymic mood, full affect   EKG:  EKG is ordered today. The ekg ordered today demonstrates A-V sequentially paced   Recent Labs: 01/14/2015: TSH 2.109 (today) PCP: Creat 1.48, K 4.2, normal LFTs, Hgb 13.1, Ferr 11.3 Total Chol 133, HDL 42, LDL 70, TG 106   Wt Readings from Last 3 Encounters:  01/14/15 195 lb 8 oz (88.678 kg)  09/03/14 197 lb 9.6 oz (89.631 kg)  03/25/14 194 lb 8 oz (88.225 kg)     ASSESSMENT AND PLAN:  CAD s/p CABG '05, patent grafts 4/09, low risk Nuc 2011  CABG 2005, Dr. Servando Snare, LIMA to LAD, SVG to intermediate, SVG to second intermedius. Angiography 2009 shows all grafts patent, nondominant right coronary artery sequential moderate stenoses in the AV groove portion left circumflex coronary artery.  Currently asymptomatic. No change in therapy planned.   Cardiomyopathy, ischemic- EF 35%  Currently NYHA functional class II. Dry weight seems to be around 190 pounds (home  scale) and he is doing an excellent job of diuretic dose adjustment. Continue sodium restriction and daily weights. He is on appropriate treatment with a full dose of angiotensin receptor blocker. Will not increase the beta blocker dose because of his reactive airway disease.   Complete heart block pacemaker dependent  Cardiac pacemaker in situ- [employer] for CHB 7/09- pacer dependednt  Normal device function. Continue with checks in office at least every  6 months. He could well benefit from upgrade of his device to a biventricular pacemaker (CRT-P), but prefers nonsurgical management as long as he feels well.   PAF- Amiodarone added 10/14  No recent episodes. Atrial fibrillation seems to have been part of his heart failure decompensation, but he was also anemic then. Will require periodic liver function tests and thyroid function tests while on amiodarone therapy. The benefit of permanent anticoagulation needs to be weighed against the risk of bleeding and anemia.   Fe deficiency anemia His anemia seems to be markedly improved. Still low ferritin - continue Fe supplement. Stop aspirin. Continue only Xarelto.  HYPERCHOLESTEROLEMIA  Excellent recent lipid profile.   PVD - CEA '02, RCFA PTA '01  Denies intermittent claudication or any neurological symptoms.   Current medicines are reviewed at length with the patient today.  The patient does not have concerns regarding medicines.  The following changes have been made:  no change  Labs/ tests ordered today include:  Orders Placed This Encounter  Procedures  . T4, free  . TSH  . Implantable device check  . EKG 12-Lead   Patient Instructions  Your physician recommends that you schedule a follow-up appointment in: 6 months with Dr.Kaige Whistler + pacemaker check  STOP ASPIRIN.  Your physician recommends that you return for lab work in: [facility].     Mikael Spray, MD  01/16/2015 5:35 PM    Todd Klein, MD,  [facility] [phone] office [phone] pager

## 2015-01-14 NOTE — Patient Instructions (Addendum)
Your physician recommends that you schedule a follow-up appointment in: 6 months with Dr.Croitoru + pacemaker check  STOP ASPIRIN.  Your physician recommends that you return for lab work in: [facility].

## 2015-01-15 ENCOUNTER — Encounter: Payer: Self-pay | Admitting: Cardiovascular Disease

## 2015-01-17 ENCOUNTER — Telehealth: Payer: Self-pay | Admitting: *Deleted

## 2015-01-17 DIAGNOSIS — Z79899 Other long term (current) drug therapy: Secondary | ICD-10-CM

## 2015-01-17 NOTE — Telephone Encounter (Signed)
Lab results called and will repeat in 6 months.  Mailed order to patient.

## 2015-01-17 NOTE — Telephone Encounter (Signed)
-----   Message from Sanda Klein, MD sent at 01/15/2015  7:58 AM EDT ----- Normal. Repeat every 6 months

## 2015-01-21 ENCOUNTER — Encounter: Payer: Self-pay | Admitting: Cardiovascular Disease

## 2015-04-11 ENCOUNTER — Other Ambulatory Visit: Payer: Self-pay | Admitting: Pulmonary Disease

## 2015-04-16 ENCOUNTER — Other Ambulatory Visit: Payer: Self-pay | Admitting: Cardiovascular Disease

## 2015-04-16 MED ORDER — LOSARTAN POTASSIUM 100 MG PO TABS: 100.0000 mg | ORAL_TABLET | Freq: Every day | ORAL | Status: AC

## 2015-04-16 NOTE — Telephone Encounter (Signed)
Pt's wife called in requesting that a 10 day supply of the pt's Losartan be called in until his [facility] medication gets in. Please f/u with the pt once the script has been called in.   Thanks

## 2015-04-16 NOTE — Telephone Encounter (Signed)
SPOKE TO PATIENT HE STATES LOCAL PHARMACY IS [facility] ON [address] INFORMED MEDICATION HAS BEEN SENT.

## 2015-05-07 ENCOUNTER — Other Ambulatory Visit: Payer: Self-pay | Admitting: Cardiovascular Disease

## 2015-05-07 NOTE — Telephone Encounter (Signed)
Rx(s) sent to pharmacy electronically.  

## 2015-07-09 ENCOUNTER — Other Ambulatory Visit: Payer: Self-pay | Admitting: Pulmonary Disease

## 2015-07-15 ENCOUNTER — Ambulatory Visit (INDEPENDENT_AMBULATORY_CARE_PROVIDER_SITE_OTHER): Payer: Commercial Managed Care - HMO | Admitting: Cardiovascular Disease

## 2015-07-15 ENCOUNTER — Encounter: Payer: Self-pay | Admitting: Cardiovascular Disease

## 2015-07-15 VITALS — BP 156/86 | HR 71 | Ht 69.0 in | Wt 192.0 lb

## 2015-07-15 DIAGNOSIS — I739 Peripheral vascular disease, unspecified: Secondary | ICD-10-CM | POA: Diagnosis not present

## 2015-07-15 DIAGNOSIS — E7800 Pure hypercholesterolemia, unspecified: Secondary | ICD-10-CM

## 2015-07-15 DIAGNOSIS — I480 Paroxysmal atrial fibrillation: Secondary | ICD-10-CM

## 2015-07-15 DIAGNOSIS — I25810 Atherosclerosis of coronary artery bypass graft(s) without angina pectoris: Secondary | ICD-10-CM

## 2015-07-15 DIAGNOSIS — I10 Essential (primary) hypertension: Secondary | ICD-10-CM | POA: Diagnosis not present

## 2015-07-15 DIAGNOSIS — Z950 Presence of cardiac pacemaker: Secondary | ICD-10-CM

## 2015-07-15 DIAGNOSIS — I442 Atrioventricular block, complete: Secondary | ICD-10-CM | POA: Diagnosis not present

## 2015-07-15 DIAGNOSIS — I255 Ischemic cardiomyopathy: Secondary | ICD-10-CM

## 2015-07-15 DIAGNOSIS — I5042 Chronic combined systolic (congestive) and diastolic (congestive) heart failure: Secondary | ICD-10-CM

## 2015-07-15 MED ORDER — ATORVASTATIN CALCIUM 20 MG PO TABS: 20.0000 mg | ORAL_TABLET | Freq: Every day | ORAL | Status: AC

## 2015-07-15 NOTE — Progress Notes (Signed)
Patient ID: Todd Kelly, male   DOB: 03-07-1931, 81 y.o.   MRN: [id number]     Cardiology Office Note   Date:  07/15/2015   ID:  Todd Kelly, DOB 21-Jun-1931, MRN [id number]  PCP:  Todd Bellows, MD  Cardiologist:   Todd Klein, MD   Chief Complaint  Patient presents with  . 6 MONTH FOLLOW UP  . Dizziness  . Shortness of Breath      History of Present Illness: Todd Kelly is a 81 y.o. male who presents for chronic systolic and diastolic heart failure, complete heart block, paroxysmal atrial fibrillation, pacemaker check , hyperlipidemia.  His dyspnea is at baseline. He does not have edema or angina. No dizziness or syncope.  His daughter past away in the spring and Todd Kelly and his wife are now taking care of their disabled granddaughter. He got caught on the doorknob and fell about a week ago. He had a bleeding laceration on the dorsum of his left hand. He did not stop taking his anticoagulant.  Todd Kelly has a lengthy and complicated cardiac history including coronary disease (coronary bypass surgery 2005, last cardiac cath 2009, low risk nuclear stress test 2011), moderate ischemic cardiomyopathy with a left ventricular ejection fraction of 35%, complete heart block status post dual-chamber pacemaker implantation (2009, [place]), congestive heart failure with episode of acute exacerbation 2014, recurrent paroxysmal atrial fibrillation on amiodarone.   He had iron deficiency anemia on Xarelto, now resolved. He continues to take Fe supplements. He has NYHA functional class 2-3 status. Feels best when his weight is around 190 pounds and takes torsemide only when his weight exceeds 193 pounds. Our office scale appears to overestimate his home weight by about 4-6 pounds.   [place] (2009) pacemaker function is normal.  Estimated longevity of the current generator is almost 4 years. He does not have any ventricular escape rhythm - truly pacemaker dependent.  97% atrial paced, 100%ventricular paced rhythm with only two 10 second episodes of mode switch in the last 6 months  Past Medical History  Diagnosis Date  . OSA (obstructive sleep apnea)   . Bronchial pneumonia   . Bronchitis, chronic with acute exacerbation   . COPD (chronic obstructive pulmonary disease)   . Hypertension   . Arteriosclerotic heart disease   . Atrial fibrillation   . Cardiac pacemaker in situ 04/12/2008    [place]  . Cerebrovascular disease   . Peripheral vascular disease   . Hypercholesteremia   . Obesity   . Diverticulosis of colon   . Adenomatous polyp of colon 08/1994  . Hemorrhoids   . DJD (degenerative joint disease)   . Chronic low back pain   . Anxiety   . CHF (congestive heart failure)   . CHB (complete heart block)   . Carotid arterial disease   . CAD (coronary artery disease)   . S/P CABG (coronary artery bypass graft) 2005    Past Surgical History  Procedure Laterality Date  . Pta to right common femoral artery  2001    Dr. Lyndel Kelly  . Carotid endarterectomy  2002    bilateral.  Dr. Kellie Kelly  . Coronary artery bypass graft  2005  . Pacemaker placement  July 2009    for CHB  Dr. Gwenlyn Kelly. St. Jude  . Appendectomy  1948  . US echocardiography  11/06/2011    Mod. LVH w/mod. depressed systolic function,EF 82-42%,PNTIR LV hypokinesis,mildly dilated LA,mild aortic root dilatation  .  Nm myoview ltd  09/04/2010    mild perfusion defect basal inferior,mid inferior, & apical inferior regions. LV systolic function deteriorated since 2006  . Cardiac catheterization  02/02/2008    patent grafts     Current Outpatient Prescriptions  Medication Sig Dispense Refill  . ALPRAZolam (XANAX) 0.5 MG tablet Take 1 tablet (0.5 mg total) by mouth 3 (three) times daily as needed for anxiety. Not to exceed 3 per day. 270 tablet 1  . amiodarone (PACERONE) 200 MG tablet Take 1 tablet (200 mg total) by mouth daily. 90 tablet 3  . atorvastatin (LIPITOR) 40 MG tablet  Take 1 tablet (40 mg total) by mouth daily. 90 tablet 3  . Coenzyme Q10 (COQ10) 100 MG CAPS Take 100 mg by mouth daily.     [name] docusate sodium (COLACE) 100 MG capsule Take 200 mg by mouth daily.     . ferrous sulfate 325 (65 FE) MG tablet Take 325 mg by mouth daily with breakfast.    . fluticasone (FLONASE) 50 MCG/ACT nasal spray USE ONE SPRAY IN EACH NOSTRIL TWICE A DAY 52 g 2  . furosemide (LASIX) 40 MG tablet Take 1 tablet (40 mg total) by mouth daily. (Patient taking differently: Take 40 mg by mouth 2 (two) times daily. Take 1 tablet in the morning and 1 tablet in the evening.) 90 tablet 3  . guaiFENesin (MUCINEX) 600 MG 12 hr tablet Take 2 tablets (1,200 mg total) by mouth 2 (two) times daily as needed for congestion.    [name] HYDROcodone-acetaminophen (NORCO/VICODIN) 5-325 MG per tablet Take 1/2 to 1 tablet by mouth three times daily as needed for pain 90 tablet 0  . losartan (COZAAR) 100 MG tablet Take 1 tablet (100 mg total) by mouth at bedtime. 10 tablet 0  . meclizine (ANTIVERT) 25 MG tablet Take 1/2 to 1 tablet by mouth every 6 hours as needed for dizziness (Patient taking differently: Take 25 mg by mouth 2 (two) times daily. ) 90 tablet 3  . metoprolol tartrate (LOPRESSOR) 25 MG tablet Take 1 tablet (25 mg total) by mouth 2 (two) times daily. (Patient taking differently: Take 25 mg by mouth 2 (two) times daily. Take 1/2 tablet in the morning and 1 tablet in the evening.) 180 tablet 3  . montelukast (SINGULAIR) 10 MG tablet TAKE 1 TABLET AT BEDTIME 90 tablet 2  . Omega-3 Fatty Acids (FISH OIL) 1000 MG CAPS Take 1 capsule by mouth daily.      . polyethylene glycol (MIRALAX / GLYCOLAX) packet Take 17 g by mouth daily as needed.     . potassium chloride SA (K-DUR,KLOR-CON) 20 MEQ tablet TAKE 1 TABLET DAILY 90 tablet 2  . predniSONE (DELTASONE) 5 MG tablet Take 5 mg by mouth daily.     . ranitidine (ZANTAC) 150 MG tablet Take 1 tablet (150 mg total) by mouth 2 (two) times daily. (Patient taking  differently: Take 150 mg by mouth 2 (two) times daily. Take 1 tablet in the morning and 1 tablet at night.) 180 tablet 2  . rivaroxaban (XARELTO) 20 MG TABS tablet Take 1 tablet (20 mg total) by mouth daily with supper. 90 tablet 3  . SPIRIVA HANDIHALER 18 MCG inhalation capsule INHALE THE CONTENTS OF 1 CAPSULE DAILY 90 capsule 0  . SYMBICORT 160-4.5 MCG/ACT inhaler INHALE 2 PUFFS INTO THE LUNGS TWICE A DAY 3 Inhaler 2  . tiotropium (SPIRIVA HANDIHALER) 18 MCG inhalation capsule INHALE THE CONTENTS OF 1 CAPSULE DAILY 90 capsule 3  .  vitamin B-12 (CYANOCOBALAMIN) 500 MCG tablet Take 500 mcg by mouth daily.    . vitamin C (ASCORBIC ACID) 500 MG tablet Take 500 mg by mouth daily.     No current facility-administered medications for this visit.   Facility-Administered Medications Ordered in Other Visits  Medication Dose Route Frequency Provider Last Rate Last Dose  . 0.9 %  sodium chloride infusion    Continuous PRN Durene Romans, CRNA        Allergies:   Penicillins    Social History:  The patient  reports that he quit smoking about 18 years ago. His smoking use included Cigarettes. He has a 81 pack-year smoking history. He has quit using smokeless tobacco. His smokeless tobacco use included Chew. He reports that he does not drink alcohol or use illicit drugs.   Family History:  The patient's family history includes Asthma in his paternal uncle; Heart attack in his father; Heart disease in his mother and paternal grandmother; Hyperlipidemia in his mother; Pancreatic cancer in his mother; Rheum arthritis in his father and paternal uncle.    ROS:  Please see the history of present illness.    Otherwise, review of systems positive for none.   All other systems are reviewed and negative.    PHYSICAL EXAM: VS:  BP 156/86 mmHg  Pulse 71  Ht '5\' 9"'$  (1.753 m)  Wt 192 lb (87.091 kg)  BMI 28.34 kg/m2 , BMI Body mass index is 28.34 kg/(m^2).  General: Alert, oriented x3, no distress   Head: no evidence of trauma, PERRL, EOMI, no exophtalmos or lid lag, no myxedema, no xanthelasma; normal ears, nose and oropharynx  Neck: normal jugular venous pulsations and no hepatojugular reflux; brisk carotid pulses without delay and no carotid bruits  Chest: Emphysematous chest, diminished breath sounds throughout but otherwise clear to auscultation, no signs of consolidation by percussion or palpation, normal fremitus, symmetrical and full respiratory excursions; healthy subclavian pacemaker site  Cardiovascular: normal position and quality of the apical impulse, regular rhythm, normal first and paradoxically split second heart sounds, no murmurs, rubs or gallops  Abdomen: no tenderness or distention, no masses by palpation, no abnormal pulsatility or arterial bruits, normal bowel sounds, no hepatosplenomegaly  Extremities: no clubbing, cyanosis or edema; 2+ radial, ulnar and brachial pulses bilaterally; 2+ right femoral, posterior tibial and dorsalis pedis pulses; 2+ left femoral, posterior tibial and dorsalis pedis pulses; no subclavian or femoral bruits  Neurological: grossly nonfocal Psych: euthymic mood, full affect   EKG:  EKG is ordered today. The ekg ordered today demonstrates  100% atrioventricular sequential pacing   Recent Labs:  August 2016 Creatinine 1.33 , normal liver function tests, hemoglobin 14.5, TSH 0.95, ferritin 40.8   Total cholesterol 102, triglycerides 58, HDL 38, LDL 52  Wt Readings from Last 3 Encounters:  07/15/15 192 lb (87.091 kg)  01/14/15 195 lb 8 oz (88.678 kg)  09/03/14 197 lb 9.6 oz (89.631 kg)     ASSESSMENT AND PLAN:  CAD s/p CABG '05, patent grafts 4/09, low risk Nuc 2011  CABG 2005, Dr. Servando Snare, LIMA to LAD, SVG to intermediate, SVG to second intermedius. Angiography 2009 shows all grafts patent, nondominant right coronary artery sequential moderate stenoses in the AV groove portion left circumflex coronary artery.  Currently  asymptomatic. No change in therapy planned.   Cardiomyopathy, ischemic- EF 35%  Currently NYHA functional class II. Dry weight seems to be around 190 pounds (home scale) and he is doing an excellent job of diuretic  dose adjustment. Continue sodium restriction and daily weights. He is on appropriate treatment with a full dose of angiotensin receptor blocker. Will not increase the beta blocker dose because of his reactive airway disease.   Complete heart block pacemaker dependent  Cardiac pacemaker in situ- St Jude for CHB 7/09- pacer dependednt  Normal device function. Continue with checks in office at least every 6 months. He could well benefit from upgrade of his device to a biventricular pacemaker (CRT-P), but prefers nonsurgical management as long as he feels well.   PAF- Amiodarone added 10/14  No recent episodes. Atrial fibrillation seems to have been part of his heart failure decompensation, but he was also anemic at the time. Will require periodic (every 6 months) liver function tests and thyroid function tests while on amiodarone therapy. The benefit of permanent anticoagulation needs to be weighed against the risk of bleeding and anemia.  He has had one recent fall, but overall has not been prone to injury.  Fe deficiency anemia His anemia has resolved and his ferritin level is now normal. Continue Xarelto.  HYPERCHOLESTEROLEMIA  Excellent recent lipid profile.  In fact, I think we can reduce his atorvastatin to 20 mg daily.  PVD - CEA '02, RCFA PTA '01  Denies intermittent claudication or any neurological symptoms.    Current medicines are reviewed at length with the patient today.  The patient does not have concerns regarding medicines.  The following changes have been made:   Reduce atorvastatin to 20 mg daily  Labs/ tests ordered today include:  Orders Placed This Encounter  Procedures  . EKG 12-Lead    Patient Instructions  Your physician has recommended you make  the following change in your medication: DECREASE ATORVASTATIN TO '20MG'$  DAILY   Dr. Sallyanne Kuster recommends that you schedule a follow-up appointment in: [place]       Signed, CROITORU,MIHAI, MD  07/15/2015 9:04 AM    Todd Klein, MD, [facility] [phone] office [phone] pager

## 2015-07-15 NOTE — Patient Instructions (Signed)
Your physician has recommended you make the following change in your medication: DECREASE ATORVASTATIN TO '20MG'$  DAILY   Dr. Sallyanne Kuster recommends that you schedule a follow-up appointment in: [place]

## 2015-07-16 ENCOUNTER — Other Ambulatory Visit: Payer: Self-pay | Admitting: *Deleted

## 2015-07-17 ENCOUNTER — Encounter: Payer: Self-pay | Admitting: Cardiovascular Disease

## 2015-07-18 ENCOUNTER — Other Ambulatory Visit: Payer: Self-pay | Admitting: *Deleted

## 2015-08-01 LAB — CUP PACEART INCLINIC DEVICE CHECK
Battery Voltage: 2.74 V
Brady Statistic RA Percent Paced: 97 %
Brady Statistic RV Percent Paced: 99 %
Date Time Interrogation Session: 20161014182300
Implantable Lead Implant Date: 20090626
Lead Channel Impedance Value: 326 Ohm
Lead Channel Pacing Threshold Pulse Width: 0.5 ms
Lead Channel Setting Pacing Amplitude: 2 V
Lead Channel Setting Pacing Pulse Width: 0.5 ms
Lead Channel Setting Sensing Sensitivity: 4 mV
MDC IDC LEAD IMPLANT DT: 20090626
MDC IDC LEAD LOCATION: 753859
MDC IDC LEAD LOCATION: 753860
MDC IDC MSMT LEADCHNL RA PACING THRESHOLD AMPLITUDE: 0.5 V
MDC IDC MSMT LEADCHNL RA SENSING INTR AMPL: 2.3 mV
MDC IDC MSMT LEADCHNL RV IMPEDANCE VALUE: 381 Ohm
MDC IDC MSMT LEADCHNL RV PACING THRESHOLD AMPLITUDE: 1.125 V
MDC IDC MSMT LEADCHNL RV PACING THRESHOLD PULSEWIDTH: 0.5 ms
MDC IDC PG SERIAL: 1247203
Pulse Gen Model: 5826

## 2015-08-05 ENCOUNTER — Other Ambulatory Visit: Payer: Self-pay | Admitting: Pulmonary Disease

## 2015-08-07 ENCOUNTER — Other Ambulatory Visit: Payer: Self-pay | Admitting: Pulmonary Disease

## 2015-08-08 ENCOUNTER — Encounter: Payer: Self-pay | Admitting: Cardiovascular Disease

## 2015-08-16 ENCOUNTER — Emergency Department (INDEPENDENT_AMBULATORY_CARE_PROVIDER_SITE_OTHER): Payer: Commercial Managed Care - HMO

## 2015-08-16 ENCOUNTER — Encounter (HOSPITAL_COMMUNITY): Payer: Self-pay | Admitting: Nurse Practitioner

## 2015-08-16 ENCOUNTER — Encounter (HOSPITAL_COMMUNITY): Payer: Self-pay | Admitting: Emergency Medicine

## 2015-08-16 ENCOUNTER — Inpatient Hospital Stay (HOSPITAL_COMMUNITY)
Admission: EM | Admit: 2015-08-16 | Discharge: 2015-08-20 | DRG: 193 | Disposition: A | Discharge status: Home / Self Care | Payer: Commercial Managed Care - HMO | Attending: Internal Medicine | Admitting: Internal Medicine

## 2015-08-16 ENCOUNTER — Emergency Department (HOSPITAL_COMMUNITY)
Admission: EM | Admit: 2015-08-16 | Discharge: 2015-08-16 | Payer: Commercial Managed Care - HMO | Source: Home / Self Care | Attending: Family Medicine | Admitting: Family Medicine

## 2015-08-16 DIAGNOSIS — I5041 Acute combined systolic (congestive) and diastolic (congestive) heart failure: Secondary | ICD-10-CM | POA: Diagnosis present

## 2015-08-16 DIAGNOSIS — Z950 Presence of cardiac pacemaker: Secondary | ICD-10-CM | POA: Diagnosis not present

## 2015-08-16 DIAGNOSIS — J189 Pneumonia, unspecified organism: Principal | ICD-10-CM | POA: Diagnosis present

## 2015-08-16 DIAGNOSIS — F419 Anxiety disorder, unspecified: Secondary | ICD-10-CM | POA: Diagnosis present

## 2015-08-16 DIAGNOSIS — E669 Obesity, unspecified: Secondary | ICD-10-CM | POA: Diagnosis present

## 2015-08-16 DIAGNOSIS — I25810 Atherosclerosis of coronary artery bypass graft(s) without angina pectoris: Secondary | ICD-10-CM | POA: Diagnosis not present

## 2015-08-16 DIAGNOSIS — J449 Chronic obstructive pulmonary disease, unspecified: Secondary | ICD-10-CM | POA: Diagnosis present

## 2015-08-16 DIAGNOSIS — Z66 Do not resuscitate: Secondary | ICD-10-CM | POA: Diagnosis present

## 2015-08-16 DIAGNOSIS — Z87891 Personal history of nicotine dependence: Secondary | ICD-10-CM

## 2015-08-16 DIAGNOSIS — I480 Paroxysmal atrial fibrillation: Secondary | ICD-10-CM | POA: Diagnosis present

## 2015-08-16 DIAGNOSIS — Z6827 Body mass index (BMI) 27.0-27.9, adult: Secondary | ICD-10-CM

## 2015-08-16 DIAGNOSIS — I2510 Atherosclerotic heart disease of native coronary artery without angina pectoris: Secondary | ICD-10-CM | POA: Diagnosis present

## 2015-08-16 DIAGNOSIS — R0902 Hypoxemia: Secondary | ICD-10-CM

## 2015-08-16 DIAGNOSIS — R0600 Dyspnea, unspecified: Secondary | ICD-10-CM

## 2015-08-16 DIAGNOSIS — S2239XA Fracture of one rib, unspecified side, initial encounter for closed fracture: Secondary | ICD-10-CM

## 2015-08-16 DIAGNOSIS — I5033 Acute on chronic diastolic (congestive) heart failure: Secondary | ICD-10-CM

## 2015-08-16 DIAGNOSIS — I110 Hypertensive heart disease with heart failure: Secondary | ICD-10-CM | POA: Diagnosis present

## 2015-08-16 DIAGNOSIS — I255 Ischemic cardiomyopathy: Secondary | ICD-10-CM | POA: Diagnosis present

## 2015-08-16 DIAGNOSIS — Z7901 Long term (current) use of anticoagulants: Secondary | ICD-10-CM

## 2015-08-16 DIAGNOSIS — D509 Iron deficiency anemia, unspecified: Secondary | ICD-10-CM | POA: Diagnosis present

## 2015-08-16 DIAGNOSIS — I4891 Unspecified atrial fibrillation: Secondary | ICD-10-CM | POA: Diagnosis present

## 2015-08-16 DIAGNOSIS — J9600 Acute respiratory failure, unspecified whether with hypoxia or hypercapnia: Secondary | ICD-10-CM

## 2015-08-16 DIAGNOSIS — Z951 Presence of aortocoronary bypass graft: Secondary | ICD-10-CM

## 2015-08-16 DIAGNOSIS — I739 Peripheral vascular disease, unspecified: Secondary | ICD-10-CM | POA: Diagnosis present

## 2015-08-16 DIAGNOSIS — E7800 Pure hypercholesterolemia, unspecified: Secondary | ICD-10-CM | POA: Diagnosis present

## 2015-08-16 DIAGNOSIS — I10 Essential (primary) hypertension: Secondary | ICD-10-CM | POA: Diagnosis present

## 2015-08-16 LAB — BASIC METABOLIC PANEL
ANION GAP: 12 (ref 5–15)
BUN: 23 mg/dL — ABNORMAL HIGH (ref 6–20)
CALCIUM: 9.2 mg/dL (ref 8.9–10.3)
CO2: 27 mmol/L (ref 22–32)
Chloride: 103 mmol/L (ref 101–111)
Creatinine, Ser: 1.14 mg/dL (ref 0.61–1.24)
GFR, EST NON AFRICAN AMERICAN: 58 mL/min — AB (ref >60)
Glucose, Bld: 104 mg/dL — ABNORMAL HIGH (ref 65–99)
POTASSIUM: 3.8 mmol/L (ref 3.5–5.1)
SODIUM: 142 mmol/L (ref 135–145)

## 2015-08-16 LAB — CBC
HEMATOCRIT: 46.5 % (ref 39.0–52.0)
HEMOGLOBIN: 15.2 g/dL (ref 13.0–17.0)
MCH: 31.3 pg (ref 26.0–34.0)
MCHC: 32.7 g/dL (ref 30.0–36.0)
MCV: 95.7 fL (ref 78.0–100.0)
Platelets: 158 10*3/uL (ref 150–400)
RBC: 4.86 MIL/uL (ref 4.22–5.81)
RDW: 14.4 % (ref 11.5–15.5)
WBC: 9 10*3/uL (ref 4.0–10.5)

## 2015-08-16 LAB — I-STAT TROPONIN, ED: TROPONIN I, POC: 0.03 ng/mL (ref 0.00–0.08)

## 2015-08-16 MED ORDER — AMIODARONE HCL 200 MG PO TABS
200.0000 mg | ORAL_TABLET | Freq: Every day | ORAL | Status: DC
  Administered 2015-08-17 – 2015-08-20 (×4): 200 mg via ORAL
  Filled 2015-08-16 (×5): qty 1

## 2015-08-16 MED ORDER — BUDESONIDE-FORMOTEROL FUMARATE 160-4.5 MCG/ACT IN AERO
2.0000 | INHALATION_SPRAY | Freq: Two times a day (BID) | RESPIRATORY_TRACT | Status: DC
  Administered 2015-08-17 – 2015-08-20 (×7): 2 via RESPIRATORY_TRACT
  Filled 2015-08-16: qty 6

## 2015-08-16 MED ORDER — ALBUTEROL SULFATE (2.5 MG/3ML) 0.083% IN NEBU
2.5000 mg | INHALATION_SOLUTION | Freq: Once | RESPIRATORY_TRACT | Status: AC
  Administered 2015-08-16: 2.5 mg via RESPIRATORY_TRACT

## 2015-08-16 MED ORDER — PREDNISONE 5 MG PO TABS
5.0000 mg | ORAL_TABLET | Freq: Every day | ORAL | Status: DC
  Administered 2015-08-17 – 2015-08-20 (×4): 5 mg via ORAL
  Filled 2015-08-16 (×4): qty 1

## 2015-08-16 MED ORDER — IPRATROPIUM-ALBUTEROL 0.5-2.5 (3) MG/3ML IN SOLN
3.0000 mL | Freq: Once | RESPIRATORY_TRACT | Status: AC
  Administered 2015-08-16: 3 mL via RESPIRATORY_TRACT

## 2015-08-16 MED ORDER — ALBUTEROL SULFATE (2.5 MG/3ML) 0.083% IN NEBU
INHALATION_SOLUTION | RESPIRATORY_TRACT | Status: AC
  Filled 2015-08-16: qty 3

## 2015-08-16 MED ORDER — LOSARTAN POTASSIUM 50 MG PO TABS
100.0000 mg | ORAL_TABLET | Freq: Every day | ORAL | Status: DC
  Administered 2015-08-17 – 2015-08-20 (×4): 100 mg via ORAL
  Filled 2015-08-16 (×4): qty 2

## 2015-08-16 MED ORDER — METOPROLOL TARTRATE 25 MG PO TABS
25.0000 mg | ORAL_TABLET | Freq: Two times a day (BID) | ORAL | Status: DC
  Administered 2015-08-17 – 2015-08-20 (×8): 25 mg via ORAL
  Filled 2015-08-16 (×8): qty 1

## 2015-08-16 MED ORDER — DOCUSATE SODIUM 50 MG PO CAPS
250.0000 mg | ORAL_CAPSULE | Freq: Every day | ORAL | Status: DC
  Administered 2015-08-17 – 2015-08-18 (×2): 250 mg via ORAL
  Filled 2015-08-16 (×4): qty 1

## 2015-08-16 MED ORDER — FERROUS SULFATE 325 (65 FE) MG PO TABS
325.0000 mg | ORAL_TABLET | Freq: Every day | ORAL | Status: DC
  Administered 2015-08-17 – 2015-08-19 (×3): 325 mg via ORAL
  Filled 2015-08-16 (×2): qty 1

## 2015-08-16 MED ORDER — ALPRAZOLAM 0.5 MG PO TABS
0.5000 mg | ORAL_TABLET | Freq: Three times a day (TID) | ORAL | Status: DC | PRN
  Administered 2015-08-19 – 2015-08-20 (×2): 0.5 mg via ORAL
  Filled 2015-08-16 (×2): qty 1

## 2015-08-16 MED ORDER — LEVOFLOXACIN IN D5W 750 MG/150ML IV SOLN
750.0000 mg | INTRAVENOUS | Status: DC
Start: 2015-08-17 — End: 2015-08-20
  Administered 2015-08-17 – 2015-08-19 (×3): 750 mg via INTRAVENOUS
  Filled 2015-08-16 (×4): qty 150

## 2015-08-16 MED ORDER — ATORVASTATIN CALCIUM 20 MG PO TABS
20.0000 mg | ORAL_TABLET | Freq: Every day | ORAL | Status: DC
Start: 2015-08-17 — End: 2015-08-20
  Administered 2015-08-17 – 2015-08-20 (×4): 20 mg via ORAL
  Filled 2015-08-16 (×4): qty 1

## 2015-08-16 MED ORDER — TIOTROPIUM BROMIDE MONOHYDRATE 18 MCG IN CAPS
18.0000 ug | ORAL_CAPSULE | Freq: Every day | RESPIRATORY_TRACT | Status: DC
  Administered 2015-08-17 – 2015-08-19 (×3): 18 ug via RESPIRATORY_TRACT
  Filled 2015-08-16: qty 5

## 2015-08-16 MED ORDER — RIVAROXABAN 20 MG PO TABS
20.0000 mg | ORAL_TABLET | Freq: Every day | ORAL | Status: DC
  Administered 2015-08-17 – 2015-08-20 (×5): 20 mg via ORAL
  Filled 2015-08-16 (×5): qty 1

## 2015-08-16 MED ORDER — GUAIFENESIN ER 600 MG PO TB12
600.0000 mg | ORAL_TABLET | Freq: Two times a day (BID) | ORAL | Status: DC
  Administered 2015-08-17 – 2015-08-20 (×8): 600 mg via ORAL
  Filled 2015-08-16 (×8): qty 1

## 2015-08-16 MED ORDER — MONTELUKAST SODIUM 10 MG PO TABS
10.0000 mg | ORAL_TABLET | Freq: Every day | ORAL | Status: DC
  Administered 2015-08-17 – 2015-08-19 (×4): 10 mg via ORAL
  Filled 2015-08-16 (×4): qty 1

## 2015-08-16 MED ORDER — MECLIZINE HCL 25 MG PO TABS: 25.0000 mg | ORAL_TABLET | Freq: Two times a day (BID) | ORAL | Status: DC | PRN

## 2015-08-16 MED ORDER — FUROSEMIDE 40 MG PO TABS: 40.0000 mg | ORAL_TABLET | Freq: Every day | ORAL | Status: DC

## 2015-08-16 MED ORDER — LEVOFLOXACIN IN D5W 750 MG/150ML IV SOLN
750.0000 mg | Freq: Once | INTRAVENOUS | Status: AC
  Administered 2015-08-16: 750 mg via INTRAVENOUS
  Filled 2015-08-16: qty 150

## 2015-08-16 MED ORDER — IPRATROPIUM-ALBUTEROL 0.5-2.5 (3) MG/3ML IN SOLN
RESPIRATORY_TRACT | Status: AC
  Filled 2015-08-16: qty 3

## 2015-08-16 NOTE — ED Notes (Addendum)
Pt went to [facility] for SOB when lying down over past few days with productive, painful cough. denies fevers. They sent him to ed for further evaluation of pneumonia, chf on CXR there. He is A&Ox4, breathing easily

## 2015-08-16 NOTE — ED Notes (Addendum)
Pt here with c/o cough with yellow phlegm, sob and burning chest pain that started 3 days ago Medical Hx CHF, taking Lasix as needed per daily home weigh, none today S/p PNA 3 years ago 93%RA with labored breathing, applied 2 liters 02 Flu vaccine last Monday

## 2015-08-16 NOTE — ED Notes (Signed)
Attempted report 

## 2015-08-16 NOTE — ED Provider Notes (Signed)
CSN: [id number]     Arrival date & time 08/16/15  1505 History   First MD Initiated Contact with Patient 08/16/15 1649     Chief Complaint  Patient presents with  . Cough  . Shortness of Breath   (Consider location/radiation/quality/duration/timing/severity/associated sxs/prior Treatment) HPI Comments: 81 year old male is accompanied by his wife and presents to the urgent care for shortness of breath, cough, DOE and orthopnea which started approximately one week ago. He denies fever. He does have a cough and occasionally a sore throat upon coughing. He denies chest pain. He has a history of COPD, pneumonia, hypertension, CHF, CAD with CABG in 2005, carotid artery disease, atrial fibrillation, bronchial pneumonia and OSA. He also has a history of smoking 81 pack years. He stopped in 1998.    Past Medical History  Diagnosis Date  . OSA (obstructive sleep apnea)   . Bronchial pneumonia   . Bronchitis, chronic with acute exacerbation ([place])   . COPD (chronic obstructive pulmonary disease) ([place])   . Hypertension   . Arteriosclerotic heart disease   . Atrial fibrillation ([place])   . Cardiac pacemaker in situ 04/12/2008    [place]  . Cerebrovascular disease   . Peripheral vascular disease ([place])   . Hypercholesteremia   . Obesity   . Diverticulosis of colon   . Adenomatous polyp of colon 08/1994  . Hemorrhoids   . DJD (degenerative joint disease)   . Chronic low back pain   . Anxiety   . CHF (congestive heart failure) ([place])   . CHB (complete heart block) ([place])   . Carotid arterial disease ([place])   . CAD (coronary artery disease)   . S/P CABG (coronary artery bypass graft) 2005   Past Surgical History  Procedure Laterality Date  . Pta to right common femoral artery  2001    Dr. Lyndel Safe  . Carotid endarterectomy  2002    bilateral.  Dr. Kellie Simmering  . Coronary artery bypass graft  2005  . Pacemaker placement  July 2009    for CHB  Dr. Gwenlyn Found. St. Jude  . Appendectomy  1948  . US  echocardiography  11/06/2011    Mod. LVH w/mod. depressed systolic function,EF 22-02%,RKYHC LV hypokinesis,mildly dilated LA,mild aortic root dilatation  . Nm myoview ltd  09/04/2010    mild perfusion defect basal inferior,mid inferior, & apical inferior regions. LV systolic function deteriorated since 2006  . Cardiac catheterization  02/02/2008    patent grafts   Family History  Problem Relation Age of Onset  . Asthma Paternal Uncle   . Heart disease Paternal Grandmother   . Heart disease Mother   . Heart attack Father   . Rheum arthritis Father   . Rheum arthritis Paternal Uncle   . Pancreatic cancer Mother   . Hyperlipidemia Mother    Social History  Substance Use Topics  . Smoking status: Former Smoker -- 1.50 packs/day for 54 years    Types: Cigarettes    Quit date: 10/18/1996  . Smokeless tobacco: Former Systems developer    Types: Chew     Comment: only used chewing tobacco while working in the yard a couple of times  . Alcohol Use: No    Review of Systems  Constitutional: Positive for activity change and fatigue. Negative for fever.  HENT: Positive for postnasal drip. Negative for congestion.   Respiratory: Positive for cough and shortness of breath. Negative for chest tightness.        Orthopnea and DOE  Cardiovascular: Negative for  chest pain, palpitations and leg swelling.  Gastrointestinal: Negative.   Genitourinary: Negative.   Skin: Negative.   Neurological: Negative.   All other systems reviewed and are negative.   Allergies  Penicillins  Home Medications   Prior to Admission medications   Medication Sig Start Date End Date Taking? Authorizing Provider  ALPRAZolam Duanne Moron) 0.5 MG tablet Take 1 tablet (0.5 mg total) by mouth 3 (three) times daily as needed for anxiety. Not to exceed 3 per day. 11/08/13   Noralee Space, MD  amiodarone (PACERONE) 200 MG tablet Take 1 tablet (200 mg total) by mouth daily. 01/14/15   Mihai Croitoru, MD  atorvastatin (LIPITOR) 20 MG tablet  Take 1 tablet (20 mg total) by mouth daily. 07/15/15   Mihai Croitoru, MD  Coenzyme Q10 (COQ10) 100 MG CAPS Take 100 mg by mouth daily.     Historical Provider, MD  docusate sodium (COLACE) 100 MG capsule Take 200 mg by mouth daily.     Historical Provider, MD  ferrous sulfate 325 (65 FE) MG tablet Take 325 mg by mouth daily with breakfast.    Historical Provider, MD  fluticasone (FLONASE) 50 MCG/ACT nasal spray USE ONE SPRAY IN EACH NOSTRIL TWICE A DAY 09/22/14   Noralee Space, MD  furosemide (LASIX) 40 MG tablet Take 1 tablet (40 mg total) by mouth daily. Patient taking differently: Take 40 mg by mouth 2 (two) times daily. Take 1 tablet in the morning and 1 tablet in the evening. 01/14/15   Mihai Croitoru, MD  guaiFENesin (MUCINEX) 600 MG 12 hr tablet Take 2 tablets (1,200 mg total) by mouth 2 (two) times daily as needed for congestion. 08/16/13   Erlene Quan, PA-C  HYDROcodone-acetaminophen (NORCO/VICODIN) 5-325 MG per tablet Take 1/2 to 1 tablet by mouth three times daily as needed for pain 01/31/14   Noralee Space, MD  losartan (COZAAR) 100 MG tablet Take 1 tablet (100 mg total) by mouth at bedtime. 04/16/15   Mihai Croitoru, MD  meclizine (ANTIVERT) 25 MG tablet Take 1/2 to 1 tablet by mouth every 6 hours as needed for dizziness Patient taking differently: Take 25 mg by mouth 2 (two) times daily.  11/08/13   Noralee Space, MD  metoprolol tartrate (LOPRESSOR) 25 MG tablet Take 1 tablet (25 mg total) by mouth 2 (two) times daily. Patient taking differently: Take 25 mg by mouth 2 (two) times daily. Take 1/2 tablet in the morning and 1 tablet in the evening. 01/14/15   Mihai Croitoru, MD  montelukast (SINGULAIR) 10 MG tablet TAKE 1 TABLET AT BEDTIME 08/06/15   Noralee Space, MD  Omega-3 Fatty Acids (FISH OIL) 1000 MG CAPS Take 1 capsule by mouth daily.      Historical Provider, MD  polyethylene glycol (MIRALAX / GLYCOLAX) packet Take 17 g by mouth daily as needed.     Historical Provider, MD  potassium  chloride SA (K-DUR,KLOR-CON) 20 MEQ tablet TAKE 1 TABLET DAILY 08/12/14   Mihai Croitoru, MD  predniSONE (DELTASONE) 5 MG tablet Take 5 mg by mouth daily.  03/20/13   Noralee Space, MD  ranitidine (ZANTAC) 150 MG tablet Take 1 tablet (150 mg total) by mouth 2 (two) times daily. Patient taking differently: Take 150 mg by mouth 2 (two) times daily. Take 1 tablet in the morning and 1 tablet at night. 05/07/15   Mihai Croitoru, MD  rivaroxaban (XARELTO) 20 MG TABS tablet Take 1 tablet (20 mg total) by mouth daily with supper.  01/14/15   Mihai Croitoru, MD  SPIRIVA HANDIHALER 18 MCG inhalation capsule INHALE THE CONTENTS OF 1 CAPSULE DAILY 04/11/15   Noralee Space, MD  SYMBICORT 160-4.5 MCG/ACT inhaler USE 2 INHALATIONS TWICE A DAY 08/07/15   Noralee Space, MD  tiotropium (SPIRIVA HANDIHALER) 18 MCG inhalation capsule INHALE THE CONTENTS OF 1 CAPSULE DAILY 11/08/13   Noralee Space, MD  vitamin B-12 (CYANOCOBALAMIN) 500 MCG tablet Take 500 mcg by mouth daily.    Historical Provider, MD  vitamin C (ASCORBIC ACID) 500 MG tablet Take 500 mg by mouth daily.    Historical Provider, MD   Meds Ordered and Administered this Visit   Medications  ipratropium-albuterol (DUONEB) 0.5-2.5 (3) MG/3ML nebulizer solution 3 mL (3 mLs Nebulization Given 08/16/15 1741)  albuterol (PROVENTIL) (2.5 MG/3ML) 0.083% nebulizer solution 2.5 mg (2.5 mg Nebulization Given 08/16/15 1741)    BP 170/81 mmHg  Pulse 73  Temp(Src) 98.1 F (36.7 C) (Oral)  Resp 22  SpO2 93% No data found.   Physical Exam  Constitutional: He is oriented to person, place, and time. He appears well-developed and well-nourished. No distress.  Patient is fully awake and alert but hard of hearing. Sitting in a chair with oxygen. On presentation his pulse ox was 93%. On oxygen it is 99%. He is talkative and cooperative.  HENT:  Mouth/Throat: No oropharyngeal exudate.  Oropharynx clear in addition to moderate amount of clear PND.  Eyes: Conjunctivae and  EOM are normal.  Neck: Normal range of motion. Neck supple.  Cardiovascular: Normal rate, regular rhythm and normal heart sounds.   Pulmonary/Chest: Effort normal.  Fair air movement. Scattered wheeze. Intermittent crackles in the left and right bases.  Musculoskeletal: He exhibits no edema.  Lymphadenopathy:    He has no cervical adenopathy.  Neurological: He is alert and oriented to person, place, and time. He exhibits normal muscle tone.  Skin: Skin is warm and dry.  Psychiatric: He has a normal mood and affect.  Nursing note and vitals reviewed.   ED Course  Procedures (including critical care time)  Labs Review Labs Reviewed - No data to display  Imaging Review Dg Chest 2 View  08/16/2015  CLINICAL DATA:  Increased shortness breath for 1 week. History of COPD and CHF. EXAM: CHEST  2 VIEW COMPARISON:  Chest x-rays dated 11/08/2013 and 08/09/2013. FINDINGS: Mild cardiomegaly is unchanged. There is central pulmonary vascular congestion which appears similar to previous exams. There is now probable mild bilateral interstitial edema. Left chest wall pacemaker/AICD is unchanged in position. Median sternotomy wires appear intact and stable in alignment. New confluent airspace opacity is seen within the inferior segment of the left upper lobe extending towards the left hilum. Additional ill-defined opacity at the right lung base. IMPRESSION: 1. Cardiomegaly with central pulmonary vascular congestion and mild bilateral interstitial edema suggesting mild CHF/volume overload. 2. New confluent airspace opacity within the inferior segment of the left upper lobe, extending towards the left hilum, which could represent pneumonia or asymmetric pulmonary edema. Favor pneumonia. 3. Additional opacity at the right lung base, more likely atelectasis and/or small effusion, but an additional basilar pneumonia cannot be excluded. These results were called by telephone at the time of interpretation on 08/16/2015  at 6:14 pm to Dr. Janne Napoleon , who verbally acknowledged these results. Electronically Signed   By: Franki Cabot M.D.   On: 08/16/2015 18:08     Visual Acuity Review  Right Eye Distance:   Left Eye Distance:  Bilateral Distance:    Right Eye Near:   Left Eye Near:    Bilateral Near:         MDM   1. Dyspnea   2. Acute on chronic diastolic congestive heart failure ([place])   3. Community acquired pneumonia   4. Hypoxia    Transfer to [place] via shuttle for management of dyspnea, DOE, orthopnea, CHF acute on chronic, left upper lobe opacity probably representing pneumonia and right lower lobe infiltrate likely representing a small effusion versus pneumonia.      Janne Napoleon, NP 08/16/15 [phone]

## 2015-08-16 NOTE — Progress Notes (Signed)
[place] for rivaroxaban Indication: atrial fibrillation  Allergies  Allergen Reactions  . Penicillins Anaphylaxis and Hives    Has patient had a PCN reaction causing immediate rash, facial/tongue/throat swelling, SOB or lightheadedness with hypotension: Yes Has patient had a PCN reaction causing severe rash involving mucus membranes or skin necrosis: No Has patient had a PCN reaction that required hospitalization Yes Has patient had a PCN reaction occurring within the last 10 years: No If all of the above answers are "NO", then may proceed with Cephalosporin use.    Patient Measurements: Height: '5\' 9"'$  (175.3 cm) Weight: 189 lb 13.1 oz (86.1 kg) IBW/kg (Calculated) : 70.7  Vital Signs: Temp: 98 F (36.7 C) (10/29 2322) Temp Source: Oral (10/29 2322) BP: 165/86 mmHg (10/29 2322) Pulse Rate: 85 (10/29 2322)  Labs:  Recent Labs  08/16/15 1919  HGB 15.2  HCT 46.5  PLT 158  CREATININE 1.14    Estimated Creatinine Clearance: 54.3 mL/min (by C-G formula based on Cr of 1.14).   Medical History: Past Medical History  Diagnosis Date  . OSA (obstructive sleep apnea)   . Bronchial pneumonia   . Bronchitis, chronic with acute exacerbation ([place])   . COPD (chronic obstructive pulmonary disease) ([place])   . Hypertension   . Arteriosclerotic heart disease   . Atrial fibrillation ([place])   . Cardiac pacemaker in situ 04/12/2008    [place]  . Cerebrovascular disease   . Peripheral vascular disease ([place])   . Hypercholesteremia   . Obesity   . Diverticulosis of colon   . Adenomatous polyp of colon 08/1994  . Hemorrhoids   . DJD (degenerative joint disease)   . Chronic low back pain   . Anxiety   . CHF (congestive heart failure) ([place])   . CHB (complete heart block) ([place])   . Carotid arterial disease ([place])   . CAD (coronary artery disease)   . S/P CABG (coronary artery bypass graft) 2005    Medications:  Prescriptions prior to  admission  Medication Sig Dispense Refill Last Dose  . ALPRAZolam (XANAX) 0.5 MG tablet Take 1 tablet (0.5 mg total) by mouth 3 (three) times daily as needed for anxiety. Not to exceed 3 per day. (Patient taking differently: Take 0.5 mg by mouth 2 (two) times daily. Not to exceed 3 per day.) 270 tablet 1 08/16/2015 at am  . amiodarone (PACERONE) 200 MG tablet Take 1 tablet (200 mg total) by mouth daily. 90 tablet 3 08/16/2015 at Unknown time  . atorvastatin (LIPITOR) 20 MG tablet Take 1 tablet (20 mg total) by mouth daily. 90 tablet 3 08/16/2015 at Unknown time  . Coenzyme Q10 (COQ10) 100 MG CAPS Take 100 mg by mouth daily.    08/16/2015 at Unknown time  . Dextromethorphan-Guaifenesin (MUCINEX DM) 30-600 MG TB12 Take 1 tablet by mouth 2 (two) times daily.   08/16/2015 at am  . docusate sodium (COLACE) 250 MG capsule Take 250 mg by mouth daily after supper.   08/15/2015 at Unknown time  . ferrous sulfate 325 (65 FE) MG tablet Take 325 mg by mouth daily after supper.    08/15/2015 at Unknown time  . fluticasone (FLONASE) 50 MCG/ACT nasal spray USE ONE SPRAY IN EACH NOSTRIL TWICE A DAY (Patient taking differently: USE ONE SPRAY IN EACH NOSTRIL TWICE A DAY AS NEEDED FOR CONGESTION) 52 g 2 week ago  . furosemide (LASIX) 40 MG tablet Take 1 tablet (40 mg total) by mouth daily. (Patient taking  differently: Take 40 mg by mouth daily as needed (weight of 192 lb or more). ) 90 tablet 3 3 weeks ago  . HYDROcodone-acetaminophen (NORCO/VICODIN) 5-325 MG per tablet Take 1/2 to 1 tablet by mouth three times daily as needed for pain (Patient taking differently: Take 0.5-1 tablets by mouth 3 (three) times daily as needed (pain). ) 90 tablet 0 08/16/2015 at 1430  . losartan (COZAAR) 100 MG tablet Take 1 tablet (100 mg total) by mouth at bedtime. (Patient taking differently: Take 100 mg by mouth daily with supper. ) 10 tablet 0 08/15/2015 at Unknown time  . meclizine (ANTIVERT) 25 MG tablet Take 1/2 to 1 tablet by mouth  every 6 hours as needed for dizziness (Patient taking differently: Take 25 mg by mouth 2 (two) times daily as needed for dizziness. ) 90 tablet 3 08/16/2015 at Unknown time  . metoprolol tartrate (LOPRESSOR) 25 MG tablet Take 1 tablet (25 mg total) by mouth 2 (two) times daily. (Patient taking differently: Take 12.5-25 mg by mouth 2 (two) times daily. Take 1/2 tablet (12.5 mg) in the morning and 1 tablet (25 mg) in the evening.) 180 tablet 3 08/16/2015 at 830  . montelukast (SINGULAIR) 10 MG tablet TAKE 1 TABLET AT BEDTIME (Patient taking differently: TAKE 1 TABLET BY MOUTH DAILY AFTER SUPPER) 90 tablet 0 08/15/2015 at Unknown time  . Omega-3 Fatty Acids (FISH OIL) 1000 MG CAPS Take 1,000 mg by mouth daily after supper.    08/15/2015 at Unknown time  . Polyethyl Glycol-Propyl Glycol (SYSTANE OP) Place 1 drop into both eyes 4 (four) times daily.   08/16/2015 at noon  . potassium chloride (K-DUR) 10 MEQ tablet Take 20 mEq by mouth daily as needed (if taking lasix).   3 weeks ago  . predniSONE (DELTASONE) 5 MG tablet Take 5 mg by mouth daily.    08/16/2015 at Unknown time  . ranitidine (ZANTAC) 150 MG tablet Take 1 tablet (150 mg total) by mouth 2 (two) times daily. (Patient taking differently: Take 150 mg by mouth daily as needed (acid reflux). ) 180 tablet 2 couple days ago  . rivaroxaban (XARELTO) 20 MG TABS tablet Take 1 tablet (20 mg total) by mouth daily with supper. 90 tablet 3 08/15/2015 at Unknown time  . SYMBICORT 160-4.5 MCG/ACT inhaler USE 2 INHALATIONS TWICE A DAY 30.6 g 1 08/16/2015 at am  . tiotropium (SPIRIVA HANDIHALER) 18 MCG inhalation capsule INHALE THE CONTENTS OF 1 CAPSULE DAILY (Patient taking differently: Place 18 mcg into inhaler and inhale daily after supper. INHALE THE CONTENTS OF 1 CAPSULE DAILY) 90 capsule 3 08/15/2015 at Unknown time  . vitamin B-12 (CYANOCOBALAMIN) 500 MCG tablet Take 500 mcg by mouth daily after supper.    08/15/2015 at Unknown time  . vitamin C (ASCORBIC  ACID) 500 MG tablet Take 500 mg by mouth daily after supper.    08/15/2015 at Unknown time  . guaiFENesin (MUCINEX) 600 MG 12 hr tablet Take 2 tablets (1,200 mg total) by mouth 2 (two) times daily as needed for congestion. (Patient not taking: Reported on 08/16/2015)   Not Taking at Unknown time  . potassium chloride SA (K-DUR,KLOR-CON) 20 MEQ tablet TAKE 1 TABLET DAILY (Patient not taking: Reported on 08/16/2015) 90 tablet 2 Not Taking at Unknown time  . SPIRIVA HANDIHALER 18 MCG inhalation capsule INHALE THE CONTENTS OF 1 CAPSULE DAILY (Patient not taking: Reported on 08/16/2015) 90 capsule 0 Not Taking at Unknown time    Assessment: Continuing Xarelto from PTA for  AFib. SCr 1.14, ecrCl 50-55 ml/min. Last dose 10/28   Goal of Therapy:  Monitor platelets by anticoagulation protocol: Yes   Plan:  -Rivaroxaban 20 mg po daily -Monitor s/sx bleeding   Hughes Better, PharmD, BCPS Clinical Pharmacist Pager: [phone] 08/16/2015 11:37 PM

## 2015-08-16 NOTE — H&P (Signed)
[facility] History and Physical  Patient: Todd Kelly  MRN: [id number]  DOB: 05/06/1931  DOS: the patient was seen and examined on 08/16/2015 PCP: Jonathon Bellows, MD  Referring physician: Dr. Venora Maples Chief Complaint: Shortness of breath and cough  HPI: Todd Kelly is a 81 y.o. male with Past medical history of COPD, hypertension, chronic systolic CHF, peripheral vascular disease, coronary artery disease, pacemaker implant, atrial fibrillation. The patient is presenting with numbness of progressively worsening shortness of breath and cough ongoing for last couple of days. Next and he also has cough with expectoration. He compresses of fever and chills. He denies any chest pain. He complains of orthopnea but no PND. He has chronic leg swelling which is stable. He mentions he is compliant with all his medications. Patient denies any active smoking does not use any oxygen at his baseline. Patient also denies any choking episodes.  The patient is coming from home.  At his baseline ambulates without support And is independent for most of his ADL; manages his medication on his own.  Review of Systems: as mentioned in the history of present illness.  A comprehensive review of the other systems is negative.  Past Medical History  Diagnosis Date  . OSA (obstructive sleep apnea)   . Bronchial pneumonia   . Bronchitis, chronic with acute exacerbation ([place])   . COPD (chronic obstructive pulmonary disease) ([place])   . Hypertension   . Arteriosclerotic heart disease   . Atrial fibrillation ([place])   . Cardiac pacemaker in situ 04/12/2008    [place]  . Cerebrovascular disease   . Peripheral vascular disease ([place])   . Hypercholesteremia   . Obesity   . Diverticulosis of colon   . Adenomatous polyp of colon 08/1994  . Hemorrhoids   . DJD (degenerative joint disease)   . Chronic low back pain   . Anxiety   . CHF (congestive heart failure) ([place])   . CHB (complete heart  block) ([place])   . Carotid arterial disease ([place])   . CAD (coronary artery disease)   . S/P CABG (coronary artery bypass graft) 2005   Past Surgical History  Procedure Laterality Date  . Pta to right common femoral artery  2001    Dr. Lyndel Safe  . Carotid endarterectomy  2002    bilateral.  Dr. Kellie Simmering  . Coronary artery bypass graft  2005  . Pacemaker placement  July 2009    for CHB  Dr. Gwenlyn Found. [employer]  . Appendectomy  1948  . US echocardiography  11/06/2011    Mod. LVH w/mod. depressed systolic function,EF 95-62%,ZHYQM LV hypokinesis,mildly dilated LA,mild aortic root dilatation  . Nm myoview ltd  09/04/2010    mild perfusion defect basal inferior,mid inferior, & apical inferior regions. LV systolic function deteriorated since 2006  . Cardiac catheterization  02/02/2008    patent grafts   Social History:  reports that he quit smoking about 18 years ago. His smoking use included Cigarettes. He has a 81 pack-year smoking history. He has quit using smokeless tobacco. His smokeless tobacco use included Chew. He reports that he does not drink alcohol or use illicit drugs.  Allergies  Allergen Reactions  . Penicillins Anaphylaxis and Hives    Has patient had a PCN reaction causing immediate rash, facial/tongue/throat swelling, SOB or lightheadedness with hypotension: Yes Has patient had a PCN reaction causing severe rash involving mucus membranes or skin necrosis: No Has patient had a PCN reaction that required hospitalization Yes Has  patient had a PCN reaction occurring within the last 10 years: No If all of the above answers are "NO", then may proceed with Cephalosporin use.    Family History  Problem Relation Age of Onset  . Asthma Paternal Uncle   . Heart disease Paternal Grandmother   . Heart disease Mother   . Heart attack Father   . Rheum arthritis Father   . Rheum arthritis Paternal Uncle   . Pancreatic cancer Mother   . Hyperlipidemia Mother     Prior to Admission  medications   Medication Sig Start Date End Date Taking? Authorizing Provider  ALPRAZolam Duanne Moron) 0.5 MG tablet Take 1 tablet (0.5 mg total) by mouth 3 (three) times daily as needed for anxiety. Not to exceed 3 per day. Patient taking differently: Take 0.5 mg by mouth 2 (two) times daily. Not to exceed 3 per day. 11/08/13  Yes Noralee Space, MD  amiodarone (PACERONE) 200 MG tablet Take 1 tablet (200 mg total) by mouth daily. 01/14/15  Yes Mihai Croitoru, MD  atorvastatin (LIPITOR) 20 MG tablet Take 1 tablet (20 mg total) by mouth daily. 07/15/15  Yes Mihai Croitoru, MD  Coenzyme Q10 (COQ10) 100 MG CAPS Take 100 mg by mouth daily.    Yes Historical Provider, MD  Dextromethorphan-Guaifenesin (MUCINEX DM) 30-600 MG TB12 Take 1 tablet by mouth 2 (two) times daily.   Yes Historical Provider, MD  docusate sodium (COLACE) 250 MG capsule Take 250 mg by mouth daily after supper.   Yes Historical Provider, MD  ferrous sulfate 325 (65 FE) MG tablet Take 325 mg by mouth daily after supper.    Yes Historical Provider, MD  fluticasone (FLONASE) 50 MCG/ACT nasal spray USE ONE SPRAY IN EACH NOSTRIL TWICE A DAY Patient taking differently: USE ONE SPRAY IN EACH NOSTRIL TWICE A DAY AS NEEDED FOR CONGESTION 09/22/14  Yes Noralee Space, MD  furosemide (LASIX) 40 MG tablet Take 1 tablet (40 mg total) by mouth daily. Patient taking differently: Take 40 mg by mouth daily as needed (weight of 192 lb or more).  01/14/15  Yes Mihai Croitoru, MD  HYDROcodone-acetaminophen (NORCO/VICODIN) 5-325 MG per tablet Take 1/2 to 1 tablet by mouth three times daily as needed for pain Patient taking differently: Take 0.5-1 tablets by mouth 3 (three) times daily as needed (pain).  01/31/14  Yes Noralee Space, MD  losartan (COZAAR) 100 MG tablet Take 1 tablet (100 mg total) by mouth at bedtime. Patient taking differently: Take 100 mg by mouth daily with supper.  04/16/15  Yes Mihai Croitoru, MD  meclizine (ANTIVERT) 25 MG tablet Take 1/2 to 1  tablet by mouth every 6 hours as needed for dizziness Patient taking differently: Take 25 mg by mouth 2 (two) times daily as needed for dizziness.  11/08/13  Yes Noralee Space, MD  metoprolol tartrate (LOPRESSOR) 25 MG tablet Take 1 tablet (25 mg total) by mouth 2 (two) times daily. Patient taking differently: Take 12.5-25 mg by mouth 2 (two) times daily. Take 1/2 tablet (12.5 mg) in the morning and 1 tablet (25 mg) in the evening. 01/14/15  Yes Mihai Croitoru, MD  montelukast (SINGULAIR) 10 MG tablet TAKE 1 TABLET AT BEDTIME Patient taking differently: TAKE 1 TABLET BY MOUTH DAILY AFTER SUPPER 08/06/15  Yes Noralee Space, MD  Omega-3 Fatty Acids (FISH OIL) 1000 MG CAPS Take 1,000 mg by mouth daily after supper.    Yes Historical Provider, MD  Polyethyl Glycol-Propyl Glycol (SYSTANE OP) Place  1 drop into both eyes 4 (four) times daily.   Yes Historical Provider, MD  potassium chloride (K-DUR) 10 MEQ tablet Take 20 mEq by mouth daily as needed (if taking lasix).   Yes Historical Provider, MD  predniSONE (DELTASONE) 5 MG tablet Take 5 mg by mouth daily.  03/20/13  Yes Noralee Space, MD  ranitidine (ZANTAC) 150 MG tablet Take 1 tablet (150 mg total) by mouth 2 (two) times daily. Patient taking differently: Take 150 mg by mouth daily as needed (acid reflux).  05/07/15  Yes Mihai Croitoru, MD  rivaroxaban (XARELTO) 20 MG TABS tablet Take 1 tablet (20 mg total) by mouth daily with supper. 01/14/15  Yes Mihai Croitoru, MD  SYMBICORT 160-4.5 MCG/ACT inhaler USE 2 INHALATIONS TWICE A DAY 08/07/15  Yes Noralee Space, MD  tiotropium (SPIRIVA HANDIHALER) 18 MCG inhalation capsule INHALE THE CONTENTS OF 1 CAPSULE DAILY Patient taking differently: Place 18 mcg into inhaler and inhale daily after supper. INHALE THE CONTENTS OF 1 CAPSULE DAILY 11/08/13  Yes Noralee Space, MD  vitamin B-12 (CYANOCOBALAMIN) 500 MCG tablet Take 500 mcg by mouth daily after supper.    Yes Historical Provider, MD  vitamin C (ASCORBIC ACID) 500  MG tablet Take 500 mg by mouth daily after supper.    Yes Historical Provider, MD  guaiFENesin (MUCINEX) 600 MG 12 hr tablet Take 2 tablets (1,200 mg total) by mouth 2 (two) times daily as needed for congestion. Patient not taking: Reported on 08/16/2015 08/16/13   Erlene Quan, PA-C  potassium chloride SA (K-DUR,KLOR-CON) 20 MEQ tablet TAKE 1 TABLET DAILY Patient not taking: Reported on 08/16/2015 08/12/14   Sanda Klein, MD  SPIRIVA HANDIHALER 18 MCG inhalation capsule INHALE THE CONTENTS OF 1 CAPSULE DAILY Patient not taking: Reported on 08/16/2015 04/11/15   Noralee Space, MD    Physical Exam: Filed Vitals:   08/16/15 2200 08/16/15 2221 08/16/15 2319 08/16/15 2322  BP:  154/97  165/86  Pulse: 81 73  85  Temp:    98 F (36.7 C)  TempSrc:    Oral  Resp: '28 31  22  '$ Height:   '5\' 9"'$  (1.753 m)   Weight:   86.1 kg (189 lb 13.1 oz)   SpO2: 98% 95%  97%    General: Alert, Awake and Oriented to Time, Place and Person. Appear in mild distress Eyes: PERRL ENT: Oral Mucosa clear moist. Neck: no JVD Cardiovascular: S1 and S2 Present, no Murmur, Peripheral Pulses Present Respiratory: Bilateral Air entry equal and Decreased,  Basal Crackles, no wheezes Abdomen: Bowel Sound present, Soft and no tenderness Skin: no Rash Extremities: Bilateral Pedal edema, no calf tenderness Neurologic: Grossly no focal neuro deficit.  Labs on Admission:  CBC:  Recent Labs Lab 08/16/15 1919  WBC 9.0  HGB 15.2  HCT 46.5  MCV 95.7  PLT 158    CMP     Component Value Date/Time   NA 142 08/16/2015 1919   K 3.8 08/16/2015 1919   CL 103 08/16/2015 1919   CO2 27 08/16/2015 1919   GLUCOSE 104* 08/16/2015 1919   GLUCOSE 109* 09/22/2006 1133   BUN 23* 08/16/2015 1919   CREATININE 1.14 08/16/2015 1919   CREATININE 1.19 09/14/2013 1652   CALCIUM 9.2 08/16/2015 1919   PROT 5.7* 11/02/2013 0756   ALBUMIN 3.5 11/02/2013 0756   AST 15 11/02/2013 0756   ALT 12 11/02/2013 0756   ALKPHOS 49  11/02/2013 0756   BILITOT 0.4 11/02/2013 0756  GFRNONAA 58* 08/16/2015 1919   GFRAA >60 08/16/2015 1919    No results for input(s): CKTOTAL, CKMB, CKMBINDEX, TROPONINI in the last 168 hours. BNP (last 3 results) No results for input(s): BNP in the last 8760 hours.  ProBNP (last 3 results) No results for input(s): PROBNP in the last 8760 hours.   Radiological Exams on Admission: Dg Chest 2 View  08/16/2015  CLINICAL DATA:  Increased shortness breath for 1 week. History of COPD and CHF. EXAM: CHEST  2 VIEW COMPARISON:  Chest x-rays dated 11/08/2013 and 08/09/2013. FINDINGS: Mild cardiomegaly is unchanged. There is central pulmonary vascular congestion which appears similar to previous exams. There is now probable mild bilateral interstitial edema. Left chest wall pacemaker/AICD is unchanged in position. Median sternotomy wires appear intact and stable in alignment. New confluent airspace opacity is seen within the inferior segment of the left upper lobe extending towards the left hilum. Additional ill-defined opacity at the right lung base. IMPRESSION: 1. Cardiomegaly with central pulmonary vascular congestion and mild bilateral interstitial edema suggesting mild CHF/volume overload. 2. New confluent airspace opacity within the inferior segment of the left upper lobe, extending towards the left hilum, which could represent pneumonia or asymmetric pulmonary edema. Favor pneumonia. 3. Additional opacity at the right lung base, more likely atelectasis and/or small effusion, but an additional basilar pneumonia cannot be excluded. These results were called by telephone at the time of interpretation on 08/16/2015 at 6:14 pm to Dr. Janne Napoleon , who verbally acknowledged these results. Electronically Signed   By: Franki Cabot M.D.   On: 08/16/2015 18:08   EKG: Independently reviewed. Paced rhythm.  Assessment/Plan 1. CAP (community acquired pneumonia) Patient is presenting with complains of cough and  shortness of breath. Chest x-ray shows possible pneumonia. No leukocytosis no fever. Patient is mildly hypoxic. The patient is currently being treated with levofloxacin for pneumonia. Would check cultures as well as urine antigens.  2 possible acute on chronic systolic CHF. 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 3 Cardiac pacemaker in situ- St Jude for CHB 7/09- pacer dependednt 4  CAD s/p CABG '05, patent grafts 4/09, low risk Nuc 2011  Would check BNP. If elevated the patient will be given IV Lasix. Continuing with home medications with follow serial troponin. BNP elevated patient will also require echocardiogram.  5 HYPERCHOLESTEROLEMIA Continuing home medications.  6  Essential hypertension Continue home medication blood pressure currently stable.  7  PAF- Amiodarone added 10/14 8  Chronic anticoagulation Continuing anticoagulation with Xarelto as well as continuing amiodarone.  9  Iron deficiency anemia Continue iron supplementation.  Nutrition: Regular diet DVT Prophylaxis: on therapeutic anticoagulation.  Advance goals of care discussion: DNR/DNI as per my discussion with patient   Disposition: Admitted as observation, telemetry unit.  Author: Berle Mull, MD [facility] Pager: [phone] 08/16/2015  If 7PM-7AM, please contact night-coverage [email or web address] Password [email or web address]

## 2015-08-16 NOTE — ED Provider Notes (Signed)
CSN: [id number]     Arrival date & time 08/16/15  1845 History   First MD Initiated Contact with Patient 08/16/15 2027     Chief Complaint  Patient presents with  . Shortness of Breath     (Consider location/radiation/quality/duration/timing/severity/associated sxs/prior Treatment) HPI Comments: Patient with a history of CAD s/p CABG, CHF, pacemaker (CHB), atrial fibrillation presents with SOB and cough x 1 week, worsening. SOB worse with lying down, DOE, now cough is productive. Fatigue, weakness, anorexia. No CP, palp., Abd. Pain vomiting, diarrhea. 3 years ago treated for same as PNA ended up being CHF.   The history is provided by the patient. No language interpreter was used.    Past Medical History  Diagnosis Date  . OSA (obstructive sleep apnea)   . Bronchial pneumonia   . Bronchitis, chronic with acute exacerbation ([place])   . COPD (chronic obstructive pulmonary disease) ([place])   . Hypertension   . Arteriosclerotic heart disease   . Atrial fibrillation ([place])   . Cardiac pacemaker in situ 04/12/2008    [place]  . Cerebrovascular disease   . Peripheral vascular disease ([place])   . Hypercholesteremia   . Obesity   . Diverticulosis of colon   . Adenomatous polyp of colon 08/1994  . Hemorrhoids   . DJD (degenerative joint disease)   . Chronic low back pain   . Anxiety   . CHF (congestive heart failure) ([place])   . CHB (complete heart block) ([place])   . Carotid arterial disease ([place])   . CAD (coronary artery disease)   . S/P CABG (coronary artery bypass graft) 2005   Past Surgical History  Procedure Laterality Date  . Pta to right common femoral artery  2001    Dr. Lyndel Safe  . Carotid endarterectomy  2002    bilateral.  Dr. Kellie Simmering  . Coronary artery bypass graft  2005  . Pacemaker placement  July 2009    for CHB  Dr. Gwenlyn Found. St. Jude  . Appendectomy  1948  . US echocardiography  11/06/2011    Mod. LVH w/mod. depressed systolic function,EF 97-98%,XQJJH LV  hypokinesis,mildly dilated LA,mild aortic root dilatation  . Nm myoview ltd  09/04/2010    mild perfusion defect basal inferior,mid inferior, & apical inferior regions. LV systolic function deteriorated since 2006  . Cardiac catheterization  02/02/2008    patent grafts   Family History  Problem Relation Age of Onset  . Asthma Paternal Uncle   . Heart disease Paternal Grandmother   . Heart disease Mother   . Heart attack Father   . Rheum arthritis Father   . Rheum arthritis Paternal Uncle   . Pancreatic cancer Mother   . Hyperlipidemia Mother    Social History  Substance Use Topics  . Smoking status: Former Smoker -- 1.50 packs/day for 54 years    Types: Cigarettes    Quit date: 10/18/1996  . Smokeless tobacco: Former Systems developer    Types: Chew     Comment: only used chewing tobacco while working in the yard a couple of times  . Alcohol Use: No    Review of Systems  Constitutional: Positive for chills, appetite change and fatigue.  HENT: Positive for sore throat. Negative for congestion and trouble swallowing.   Respiratory: Positive for cough and shortness of breath.   Cardiovascular: Negative for chest pain.  Gastrointestinal: Negative for nausea, vomiting and abdominal pain.  Musculoskeletal: Negative for myalgias.  Skin: Negative for rash.  Neurological: Positive for weakness.  Allergies  Penicillins  Home Medications   Prior to Admission medications   Medication Sig Start Date End Date Taking? Authorizing Provider  ALPRAZolam Duanne Moron) 0.5 MG tablet Take 1 tablet (0.5 mg total) by mouth 3 (three) times daily as needed for anxiety. Not to exceed 3 per day. 11/08/13   Noralee Space, MD  amiodarone (PACERONE) 200 MG tablet Take 1 tablet (200 mg total) by mouth daily. 01/14/15   Mihai Croitoru, MD  atorvastatin (LIPITOR) 20 MG tablet Take 1 tablet (20 mg total) by mouth daily. 07/15/15   Mihai Croitoru, MD  Coenzyme Q10 (COQ10) 100 MG CAPS Take 100 mg by mouth daily.      Historical Provider, MD  docusate sodium (COLACE) 100 MG capsule Take 200 mg by mouth daily.     Historical Provider, MD  ferrous sulfate 325 (65 FE) MG tablet Take 325 mg by mouth daily with breakfast.    Historical Provider, MD  fluticasone (FLONASE) 50 MCG/ACT nasal spray USE ONE SPRAY IN EACH NOSTRIL TWICE A DAY 09/22/14   Noralee Space, MD  furosemide (LASIX) 40 MG tablet Take 1 tablet (40 mg total) by mouth daily. Patient taking differently: Take 40 mg by mouth 2 (two) times daily. Take 1 tablet in the morning and 1 tablet in the evening. 01/14/15   Mihai Croitoru, MD  guaiFENesin (MUCINEX) 600 MG 12 hr tablet Take 2 tablets (1,200 mg total) by mouth 2 (two) times daily as needed for congestion. 08/16/13   Erlene Quan, PA-C  HYDROcodone-acetaminophen (NORCO/VICODIN) 5-325 MG per tablet Take 1/2 to 1 tablet by mouth three times daily as needed for pain 01/31/14   Noralee Space, MD  losartan (COZAAR) 100 MG tablet Take 1 tablet (100 mg total) by mouth at bedtime. 04/16/15   Mihai Croitoru, MD  meclizine (ANTIVERT) 25 MG tablet Take 1/2 to 1 tablet by mouth every 6 hours as needed for dizziness Patient taking differently: Take 25 mg by mouth 2 (two) times daily.  11/08/13   Noralee Space, MD  metoprolol tartrate (LOPRESSOR) 25 MG tablet Take 1 tablet (25 mg total) by mouth 2 (two) times daily. Patient taking differently: Take 25 mg by mouth 2 (two) times daily. Take 1/2 tablet in the morning and 1 tablet in the evening. 01/14/15   Mihai Croitoru, MD  montelukast (SINGULAIR) 10 MG tablet TAKE 1 TABLET AT BEDTIME 08/06/15   Noralee Space, MD  Omega-3 Fatty Acids (FISH OIL) 1000 MG CAPS Take 1 capsule by mouth daily.      Historical Provider, MD  polyethylene glycol (MIRALAX / GLYCOLAX) packet Take 17 g by mouth daily as needed.     Historical Provider, MD  potassium chloride SA (K-DUR,KLOR-CON) 20 MEQ tablet TAKE 1 TABLET DAILY 08/12/14   Mihai Croitoru, MD  predniSONE (DELTASONE) 5 MG tablet Take 5 mg  by mouth daily.  03/20/13   Noralee Space, MD  ranitidine (ZANTAC) 150 MG tablet Take 1 tablet (150 mg total) by mouth 2 (two) times daily. Patient taking differently: Take 150 mg by mouth 2 (two) times daily. Take 1 tablet in the morning and 1 tablet at night. 05/07/15   Mihai Croitoru, MD  rivaroxaban (XARELTO) 20 MG TABS tablet Take 1 tablet (20 mg total) by mouth daily with supper. 01/14/15   Mihai Croitoru, MD  SPIRIVA HANDIHALER 18 MCG inhalation capsule INHALE THE CONTENTS OF 1 CAPSULE DAILY 04/11/15   Noralee Space, MD  SYMBICORT 160-4.5 MCG/ACT inhaler  USE 2 INHALATIONS TWICE A DAY 08/07/15   Noralee Space, MD  tiotropium (SPIRIVA HANDIHALER) 18 MCG inhalation capsule INHALE THE CONTENTS OF 1 CAPSULE DAILY 11/08/13   Noralee Space, MD  vitamin B-12 (CYANOCOBALAMIN) 500 MCG tablet Take 500 mcg by mouth daily.    Historical Provider, MD  vitamin C (ASCORBIC ACID) 500 MG tablet Take 500 mg by mouth daily.    Historical Provider, MD   BP 159/76 mmHg  Pulse 68  Temp(Src) 97.6 F (36.4 C) (Oral)  Resp 16  SpO2 95% Physical Exam  Constitutional: He is oriented to person, place, and time.  Neck: Normal range of motion. Neck supple.  Cardiovascular: Normal rate.   No murmur heard. Pulmonary/Chest: Effort normal. He exhibits no tenderness.  Diffuse expiratory wheezing. No rales, rhonchi. Actively coughing.  Abdominal: Soft. There is no tenderness.  Musculoskeletal: Normal range of motion. He exhibits edema.  1+ edema bilateral LE.  Neurological: He is alert and oriented to person, place, and time. Coordination normal.  Skin: Skin is warm and dry.  Psychiatric: He has a normal mood and affect.    ED Course  Procedures (including critical care time) Labs Review Labs Reviewed  BASIC METABOLIC PANEL - Abnormal; Notable for the following:    Glucose, Bld 104 (*)    BUN 23 (*)    GFR calc non Af Amer 58 (*)    All other components within normal limits  CBC  I-STAT TROPOININ, ED     Imaging Review Dg Chest 2 View  08/16/2015  CLINICAL DATA:  Increased shortness breath for 1 week. History of COPD and CHF. EXAM: CHEST  2 VIEW COMPARISON:  Chest x-rays dated 11/08/2013 and 08/09/2013. FINDINGS: Mild cardiomegaly is unchanged. There is central pulmonary vascular congestion which appears similar to previous exams. There is now probable mild bilateral interstitial edema. Left chest wall pacemaker/AICD is unchanged in position. Median sternotomy wires appear intact and stable in alignment. New confluent airspace opacity is seen within the inferior segment of the left upper lobe extending towards the left hilum. Additional ill-defined opacity at the right lung base. IMPRESSION: 1. Cardiomegaly with central pulmonary vascular congestion and mild bilateral interstitial edema suggesting mild CHF/volume overload. 2. New confluent airspace opacity within the inferior segment of the left upper lobe, extending towards the left hilum, which could represent pneumonia or asymmetric pulmonary edema. Favor pneumonia. 3. Additional opacity at the right lung base, more likely atelectasis and/or small effusion, but an additional basilar pneumonia cannot be excluded. These results were called by telephone at the time of interpretation on 08/16/2015 at 6:14 pm to Dr. Janne Napoleon , who verbally acknowledged these results. Electronically Signed   By: Franki Cabot M.D.   On: 08/16/2015 18:08   I have personally reviewed and evaluated these images and lab results as part of my medical decision-making.   EKG Interpretation None      MDM   Final diagnoses:  None    1. CAP  Mixed picture CHF with edema on x-ray, peripheral swelling, orthopnea, and CAP with productive cough, malaise, fatigue, abnormal CXR with findings of pneumonia in LUL. Favor treating for pnemonia, Levaquin started secondary to anaphylactic PCN reaction. No evidence to suggest sepsis. [facility] paged for admission.    Charlann Lange, PA-C 08/16/15 2150  Jola Schmidt, MD 08/16/15 2203

## 2015-08-17 ENCOUNTER — Encounter (HOSPITAL_COMMUNITY): Payer: Self-pay | Admitting: *Deleted

## 2015-08-17 DIAGNOSIS — Z6827 Body mass index (BMI) 27.0-27.9, adult: Secondary | ICD-10-CM | POA: Diagnosis not present

## 2015-08-17 DIAGNOSIS — J9601 Acute respiratory failure with hypoxia: Secondary | ICD-10-CM | POA: Diagnosis not present

## 2015-08-17 DIAGNOSIS — Z87891 Personal history of nicotine dependence: Secondary | ICD-10-CM | POA: Diagnosis not present

## 2015-08-17 DIAGNOSIS — I480 Paroxysmal atrial fibrillation: Secondary | ICD-10-CM | POA: Diagnosis present

## 2015-08-17 DIAGNOSIS — I110 Hypertensive heart disease with heart failure: Secondary | ICD-10-CM | POA: Diagnosis present

## 2015-08-17 DIAGNOSIS — E7800 Pure hypercholesterolemia, unspecified: Secondary | ICD-10-CM | POA: Diagnosis present

## 2015-08-17 DIAGNOSIS — Z7901 Long term (current) use of anticoagulants: Secondary | ICD-10-CM | POA: Diagnosis not present

## 2015-08-17 DIAGNOSIS — E669 Obesity, unspecified: Secondary | ICD-10-CM | POA: Diagnosis present

## 2015-08-17 DIAGNOSIS — J189 Pneumonia, unspecified organism: Secondary | ICD-10-CM | POA: Diagnosis present

## 2015-08-17 DIAGNOSIS — I5041 Acute combined systolic (congestive) and diastolic (congestive) heart failure: Secondary | ICD-10-CM | POA: Diagnosis present

## 2015-08-17 DIAGNOSIS — Z950 Presence of cardiac pacemaker: Secondary | ICD-10-CM | POA: Diagnosis not present

## 2015-08-17 DIAGNOSIS — Z66 Do not resuscitate: Secondary | ICD-10-CM | POA: Diagnosis present

## 2015-08-17 DIAGNOSIS — D509 Iron deficiency anemia, unspecified: Secondary | ICD-10-CM | POA: Diagnosis present

## 2015-08-17 DIAGNOSIS — I509 Heart failure, unspecified: Secondary | ICD-10-CM | POA: Diagnosis not present

## 2015-08-17 DIAGNOSIS — J449 Chronic obstructive pulmonary disease, unspecified: Secondary | ICD-10-CM | POA: Diagnosis present

## 2015-08-17 DIAGNOSIS — Z951 Presence of aortocoronary bypass graft: Secondary | ICD-10-CM | POA: Diagnosis not present

## 2015-08-17 DIAGNOSIS — I4891 Unspecified atrial fibrillation: Secondary | ICD-10-CM | POA: Diagnosis not present

## 2015-08-17 DIAGNOSIS — I10 Essential (primary) hypertension: Secondary | ICD-10-CM | POA: Diagnosis not present

## 2015-08-17 DIAGNOSIS — I739 Peripheral vascular disease, unspecified: Secondary | ICD-10-CM | POA: Diagnosis present

## 2015-08-17 DIAGNOSIS — F419 Anxiety disorder, unspecified: Secondary | ICD-10-CM | POA: Diagnosis present

## 2015-08-17 DIAGNOSIS — I255 Ischemic cardiomyopathy: Secondary | ICD-10-CM | POA: Diagnosis not present

## 2015-08-17 LAB — TROPONIN I
TROPONIN I: 0.09 ng/mL — AB (ref <0.031)
Troponin I: 0.06 ng/mL — ABNORMAL HIGH (ref <0.031)
Troponin I: 0.08 ng/mL — ABNORMAL HIGH (ref <0.031)

## 2015-08-17 LAB — CBC WITH DIFFERENTIAL/PLATELET
BASOS ABS: 0 10*3/uL (ref 0.0–0.1)
Basophils Relative: 0 %
Eosinophils Absolute: 0 10*3/uL (ref 0.0–0.7)
Eosinophils Relative: 0 %
HEMATOCRIT: 43.8 % (ref 39.0–52.0)
HEMOGLOBIN: 14.4 g/dL (ref 13.0–17.0)
LYMPHS ABS: 0.9 10*3/uL (ref 0.7–4.0)
LYMPHS PCT: 9 %
MCH: 31 pg (ref 26.0–34.0)
MCHC: 32.9 g/dL (ref 30.0–36.0)
MCV: 94.4 fL (ref 78.0–100.0)
Monocytes Absolute: 1.1 10*3/uL — ABNORMAL HIGH (ref 0.1–1.0)
Monocytes Relative: 11 %
NEUTROS ABS: 8.4 10*3/uL — AB (ref 1.7–7.7)
Neutrophils Relative %: 80 %
PLATELETS: 147 10*3/uL — AB (ref 150–400)
RBC: 4.64 MIL/uL (ref 4.22–5.81)
RDW: 14.4 % (ref 11.5–15.5)
WBC: 10.4 10*3/uL (ref 4.0–10.5)

## 2015-08-17 LAB — COMPREHENSIVE METABOLIC PANEL
ALBUMIN: 3.2 g/dL — AB (ref 3.5–5.0)
ALT: 13 U/L — ABNORMAL LOW (ref 17–63)
AST: 17 U/L (ref 15–41)
Alkaline Phosphatase: 58 U/L (ref 38–126)
Anion gap: 10 (ref 5–15)
BUN: 20 mg/dL (ref 6–20)
CHLORIDE: 101 mmol/L (ref 101–111)
CO2: 26 mmol/L (ref 22–32)
Calcium: 8.4 mg/dL — ABNORMAL LOW (ref 8.9–10.3)
Creatinine, Ser: 0.98 mg/dL (ref 0.61–1.24)
GFR calc Af Amer: >60 mL/min (ref >60)
Glucose, Bld: 94 mg/dL (ref 65–99)
POTASSIUM: 3.3 mmol/L — AB (ref 3.5–5.1)
Sodium: 137 mmol/L (ref 135–145)
Total Bilirubin: 1.1 mg/dL (ref 0.3–1.2)
Total Protein: 6 g/dL — ABNORMAL LOW (ref 6.5–8.1)

## 2015-08-17 LAB — BRAIN NATRIURETIC PEPTIDE: B NATRIURETIC PEPTIDE 5: 2835.4 pg/mL — AB (ref 0.0–100.0)

## 2015-08-17 LAB — GLUCOSE, CAPILLARY: GLUCOSE-CAPILLARY: 127 mg/dL — AB (ref 65–99)

## 2015-08-17 LAB — STREP PNEUMONIAE URINARY ANTIGEN: STREP PNEUMO URINARY ANTIGEN: NEGATIVE

## 2015-08-17 LAB — TSH: TSH: 1.724 u[IU]/mL (ref 0.350–4.500)

## 2015-08-17 LAB — MAGNESIUM: Magnesium: 1.9 mg/dL (ref 1.7–2.4)

## 2015-08-17 LAB — PROTIME-INR
INR: 1.52 — ABNORMAL HIGH (ref 0.00–1.49)
Prothrombin Time: 18.3 seconds — ABNORMAL HIGH (ref 11.6–15.2)

## 2015-08-17 MED ORDER — POTASSIUM CHLORIDE CRYS ER 20 MEQ PO TBCR
40.0000 meq | EXTENDED_RELEASE_TABLET | Freq: Once | ORAL | Status: AC
  Administered 2015-08-17: 40 meq via ORAL
  Filled 2015-08-17: qty 2

## 2015-08-17 MED ORDER — FUROSEMIDE 10 MG/ML IJ SOLN
40.0000 mg | Freq: Every day | INTRAMUSCULAR | Status: DC
  Administered 2015-08-17 – 2015-08-18 (×2): 40 mg via INTRAVENOUS
  Filled 2015-08-17 (×2): qty 4

## 2015-08-17 MED ORDER — FUROSEMIDE 10 MG/ML IJ SOLN
40.0000 mg | Freq: Once | INTRAMUSCULAR | Status: AC
  Administered 2015-08-17: 40 mg via INTRAVENOUS
  Filled 2015-08-17: qty 4

## 2015-08-17 NOTE — Progress Notes (Signed)
Pt stated he has difficulty urinating at times. Also stated he felt he was going to burst. Bladder scan showed 692. K. Schorr NP paged. Awaiting new orders. Will continue to monitor.

## 2015-08-17 NOTE — Discharge Instructions (Signed)
Information on my medicine - XARELTO (Rivaroxaban)  This medication education was reviewed with me or my healthcare representative as part of my discharge preparation.  The pharmacist that spoke with me during my hospital stay was:  Myrene Galas, [facility]  Why was Xarelto prescribed for you? Xarelto was prescribed for you to reduce the risk of a blood clot forming that can cause a stroke if you have a medical condition called atrial fibrillation (a type of irregular heartbeat).  What do you need to know about xarelto ? Take your Xarelto ONCE DAILY at the same time every day with your evening meal. If you have difficulty swallowing the tablet whole, you may crush it and mix in applesauce just prior to taking your dose.  Take Xarelto exactly as prescribed by your doctor and DO NOT stop taking Xarelto without talking to the doctor who prescribed the medication.  Stopping without other stroke prevention medication to take the place of Xarelto may increase your risk of developing a clot that causes a stroke.  Refill your prescription before you run out.  After discharge, you should have regular check-up appointments with your healthcare provider that is prescribing your Xarelto.  In the future your dose may need to be changed if your kidney function or weight changes by a significant amount.  What do you do if you miss a dose? If you are taking Xarelto ONCE DAILY and you miss a dose, take it as soon as you remember on the same day then continue your regularly scheduled once daily regimen the next day. Do not take two doses of Xarelto at the same time or on the same day.   Important Safety Information A possible side effect of Xarelto is bleeding. You should call your healthcare provider right away if you experience any of the following: ? Bleeding from an injury or your nose that does not stop. ? Unusual colored urine (red or dark brown) or unusual colored stools (red or  black). ? Unusual bruising for unknown reasons. ? A serious fall or if you hit your head (even if there is no bleeding).  Some medicines may interact with Xarelto and might increase your risk of bleeding while on Xarelto. To help avoid this, consult your healthcare provider or pharmacist prior to using any new prescription or non-prescription medications, including herbals, vitamins, non-steroidal anti-inflammatory drugs (NSAIDs) and supplements.  This website has more information on Xarelto: [email or web address]

## 2015-08-17 NOTE — Progress Notes (Signed)
NURSING PROGRESS NOTE  Todd Kelly [id number] Admission Data: 08/17/2015 6:17 AM Attending Provider: Donne Hazel, MD DXA:JOIN, Valla Leaver, MD Code Status: DNR   Todd Kelly is a 81 y.o. male patient admitted from ED:  -No acute distress noted.  -No complaints of shortness of breath.  -No complaints of chest pain.   Cardiac Monitoring: Box #04 in place. Cardiac monitor yields: ventricular paced, bundle branch block  Blood pressure 151/75, pulse 65, temperature 98.2 F (36.8 C), temperature source Oral, resp. rate 16, height '5\' 9"'$  (1.753 m), weight 86.2 kg (190 lb 0.6 oz), SpO2 98 %.   IV Fluids:  IV in place, occlusive dsg intact without redness, IV cath antecubital left, condition patent and no redness none.   Allergies:  Penicillins  Past Medical History:   has a past medical history of OSA (obstructive sleep apnea); Bronchial pneumonia; Bronchitis, chronic with acute exacerbation ([place]); COPD (chronic obstructive pulmonary disease) ([place]); Hypertension; Arteriosclerotic heart disease; Atrial fibrillation ([place]); Cardiac pacemaker in situ (04/12/2008); Cerebrovascular disease; Peripheral vascular disease ([place]); Hypercholesteremia; Obesity; Diverticulosis of colon; Adenomatous polyp of colon (08/1994); Hemorrhoids; DJD (degenerative joint disease); Chronic low back pain; Anxiety; CHF (congestive heart failure) ([place]); CHB (complete heart block) ([place]); Carotid arterial disease ([facility]); CAD (coronary artery disease); and S/P CABG (coronary artery bypass graft) (2005).  Past Surgical History:   has past surgical history that includes PTA to Right Common Femoral Artery (2001); Carotid endarterectomy (2002); Coronary artery bypass graft (2005); pacemaker placement (July 2009); Appendectomy (1948); US ECHOCARDIOGRAPHY (11/06/2011); NM MYOVIEW LTD (09/04/2010); and Cardiac catheterization (02/02/2008).  Social History:   reports that he quit smoking about 18 years ago. His smoking use included  Cigarettes. He has a 81 pack-year smoking history. He has quit using smokeless tobacco. His smokeless tobacco use included Chew. He reports that he does not drink alcohol or use illicit drugs.  Skin: MASD to abdominal folds, dark discoloration to BUE.  Patient/Family orientated to room. Information packet given to patient/family. Admission inpatient armband information verified with patient/family to include name and date of birth and placed on patient arm. Side rails up x 2, fall assessment and education completed with patient/family. Patient/family able to verbalize understanding of risk associated with falls and verbalized understanding to call for assistance before getting out of bed. Call light within reach. Patient/family able to voice and demonstrate understanding of unit orientation instructions.

## 2015-08-17 NOTE — Progress Notes (Signed)
[facility] PROGRESS NOTE  Todd Kelly JGG:[id number] DOB: 04/12/1931 DOA: 08/16/2015 PCP: Jonathon Bellows, MD  HPI/Brief narrative 81 y.o. male with Past medical history of COPD, hypertension, chronic systolic CHF, peripheral vascular disease, coronary artery disease, pacemaker implant, atrial fibrillation who presented with progressively worsening sob with increased cough.  Assessment/Plan: 1. CAP (community acquired pneumonia) Pt presented with complains of cough and shortness of breath. Chest x-ray shows new confluent L upper lobe opacity suspicious for PNA No leukocytosis no fever. Patient is mildly hypoxic. Pt was continued on empiric levofloxacin for pneumonia. F/u cultures as well as urine antigens.  2 Acute on chronic systolic CHF. 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 Cardiac pacemaker in situ- St Jude for CHB 7/09- pacer dependednt CAD s/p CABG '05, patent grafts 4/09, low risk Nuc 2011 BNP elevated at 2835 Continued patient on '40mg'$  IV lasix qday Continuing with home medications with follow serial troponin. Follow i/o and daily weights. Wt today 86.2kg Most recent documented echo noted to be in 2014. Will repeat study  5 HYPERCHOLESTEROLEMIA - Continuing home medications.  6 Essential hypertension Continue home medication blood pressure currently stable.  7 PAF CHADS 3 Amiodarone was added 10/14 Currently rate controlled Continued on Xarelto for secondary stroke prevention  8 Chronic anticoagulation Continuing anticoagulation with Xarelto for afib  9 Iron deficiency anemia Continue iron supplementation.  Nutrition: Regular diet DVT Prophylaxis: on therapeutic anticoagulation.  Code Status: DNR Family Communication: Pt in room Disposition Plan: Pending   Consultants:    Procedures:    Antibiotics: Anti-infectives    Start     Dose/Rate Route Frequency Ordered Stop   08/17/15 2200  levofloxacin (LEVAQUIN) IVPB 750 mg     750  mg 100 mL/hr over 90 Minutes Intravenous Every 24 hours 08/16/15 2321 08/22/15 2159   08/16/15 2145  levofloxacin (LEVAQUIN) IVPB 750 mg     750 mg 100 mL/hr over 90 Minutes Intravenous  Once 08/16/15 2137 08/16/15 2343      HPI/Subjective: Still feels mildly short of breath  Objective: Filed Vitals:   08/16/15 2322 08/17/15 0511 08/17/15 0753 08/17/15 1353  BP: 165/86 151/75  132/60  Pulse: 85 65  67  Temp: 98 F (36.7 C) 98.2 F (36.8 C)  98.4 F (36.9 C)  TempSrc: Oral Oral  Oral  Resp: '22 16  19  '$ Height:      Weight:  86.2 kg (190 lb 0.6 oz)    SpO2: 97% 98% 96% 94%    Intake/Output Summary (Last 24 hours) at 08/17/15 1558 Last data filed at 08/17/15 1400  Gross per 24 hour  Intake    360 ml  Output   1550 ml  Net  -1190 ml   Filed Weights   08/16/15 2319 08/17/15 0511  Weight: 86.1 kg (189 lb 13.1 oz) 86.2 kg (190 lb 0.6 oz)    Exam:   General:  Awake, in nad  Cardiovascular: regular, s1, s2  Respiratory: normal resp effort, no wheezing  Abdomen: soft, nondistended  Musculoskeletal: perfused, no clubbing   Data Reviewed: Basic Metabolic Panel:  Recent Labs Lab 08/16/15 1919 08/17/15 0035 08/17/15 0600  NA 142  --  137  Kelly 3.8  --  3.3*  CL 103  --  101  CO2 27  --  26  GLUCOSE 104*  --  94  BUN 23*  --  20  CREATININE 1.14  --  0.98  CALCIUM 9.2  --  8.4*  MG  --  1.9  --  Liver Function Tests:  Recent Labs Lab 08/17/15 0600  AST 17  ALT 13*  ALKPHOS 58  BILITOT 1.1  PROT 6.0*  ALBUMIN 3.2*   No results for input(s): LIPASE, AMYLASE in the last 168 hours. No results for input(s): AMMONIA in the last 168 hours. CBC:  Recent Labs Lab 08/16/15 1919 08/17/15 0600  WBC 9.0 10.4  NEUTROABS  --  8.4*  HGB 15.2 14.4  HCT 46.5 43.8  MCV 95.7 94.4  PLT 158 147*   Cardiac Enzymes:  Recent Labs Lab 08/17/15 0035 08/17/15 0600 08/17/15 1126  TROPONINI 0.06* 0.08* 0.09*   BNP (last 3 results)  Recent Labs   08/17/15 0035  BNP 2835.4*    ProBNP (last 3 results) No results for input(s): PROBNP in the last 8760 hours.  CBG: No results for input(s): GLUCAP in the last 168 hours.  No results found for this or any previous visit (from the past 240 hour(s)).   Studies: Dg Chest 2 View  08/16/2015  CLINICAL DATA:  Increased shortness breath for 1 week. History of COPD and CHF. EXAM: CHEST  2 VIEW COMPARISON:  Chest x-rays dated 11/08/2013 and 08/09/2013. FINDINGS: Mild cardiomegaly is unchanged. There is central pulmonary vascular congestion which appears similar to previous exams. There is now probable mild bilateral interstitial edema. Left chest wall pacemaker/AICD is unchanged in position. Median sternotomy wires appear intact and stable in alignment. New confluent airspace opacity is seen within the inferior segment of the left upper lobe extending towards the left hilum. Additional ill-defined opacity at the right lung base. IMPRESSION: 1. Cardiomegaly with central pulmonary vascular congestion and mild bilateral interstitial edema suggesting mild CHF/volume overload. 2. New confluent airspace opacity within the inferior segment of the left upper lobe, extending towards the left hilum, which could represent pneumonia or asymmetric pulmonary edema. Favor pneumonia. 3. Additional opacity at the right lung base, more likely atelectasis and/or small effusion, but an additional basilar pneumonia cannot be excluded. These results were called by telephone at the time of interpretation on 08/16/2015 at 6:14 pm to Dr. Janne Napoleon , who verbally acknowledged these results. Electronically Signed   By: Franki Cabot M.D.   On: 08/16/2015 18:08    Scheduled Meds: . amiodarone  200 mg Oral Daily  . atorvastatin  20 mg Oral Daily  . budesonide-formoterol  2 puff Inhalation BID  . docusate sodium  250 mg Oral QPC supper  . ferrous sulfate  325 mg Oral QPC supper  . furosemide  40 mg Intravenous Daily  .  guaiFENesin  600 mg Oral BID  . levofloxacin (LEVAQUIN) IV  750 mg Intravenous Q24H  . losartan  100 mg Oral Q supper  . metoprolol tartrate  25 mg Oral BID  . montelukast  10 mg Oral QHS  . predniSONE  5 mg Oral Daily  . rivaroxaban  20 mg Oral Q supper  . tiotropium  18 mcg Inhalation QPC supper   Continuous Infusions:   Principal Problem:   CAP (community acquired pneumonia) Active Problems:   HYPERCHOLESTEROLEMIA   Essential hypertension   PAF- Amiodarone added 10/14   PVD - CEA '02, RCFA PTA '01   COPD (chronic obstructive pulmonary disease) ([place])   Cardiac pacemaker in situ- St Jude for CHB 7/09- pacer dependednt   Cardiomyopathy, ischemic- EF 35%   CAD s/p CABG '05, patent grafts 4/09, low risk Nuc 2011   Chronic anticoagulation   Iron deficiency anemia    Todd Kelly  [facility] Pager [phone]. If 7PM-7AM, please contact night-coverage at [email or web address], password [facility] 08/17/2015, 3:58 PM  LOS: 1 day

## 2015-08-18 LAB — BASIC METABOLIC PANEL
ANION GAP: 8 (ref 5–15)
BUN: 22 mg/dL — ABNORMAL HIGH (ref 6–20)
CALCIUM: 8.9 mg/dL (ref 8.9–10.3)
CHLORIDE: 101 mmol/L (ref 101–111)
CO2: 33 mmol/L — AB (ref 22–32)
Creatinine, Ser: 1.3 mg/dL — ABNORMAL HIGH (ref 0.61–1.24)
GFR calc Af Amer: 57 mL/min — ABNORMAL LOW (ref >60)
GFR calc non Af Amer: 49 mL/min — ABNORMAL LOW (ref >60)
GLUCOSE: 107 mg/dL — AB (ref 65–99)
Potassium: 4 mmol/L (ref 3.5–5.1)
Sodium: 142 mmol/L (ref 135–145)

## 2015-08-18 LAB — LEGIONELLA PNEUMOPHILA SEROGP 1 UR AG: L. pneumophila Serogp 1 Ur Ag: NEGATIVE

## 2015-08-18 MED ORDER — FUROSEMIDE 40 MG PO TABS: 40.0000 mg | ORAL_TABLET | Freq: Every day | ORAL | Status: DC

## 2015-08-18 NOTE — Progress Notes (Signed)
The patient's urine in the catheter was red upon assessment.  Todd Kelly was notified.

## 2015-08-18 NOTE — Evaluation (Signed)
Occupational Therapy Evaluation Patient Details Name: Todd Kelly MRN: [id number] DOB: 1931/10/05 Today's Date: 08/18/2015    History of Present Illness 81 y.o. male with Past medical history of COPD, hypertension, chronic systolic CHF, peripheral vascular disease, coronary artery disease, pacemaker implant, atrial fibrillation who presented with progressively worsening sob with increased cough.   Clinical Impression   Pt admitted with above. Pt receiving some assist with ADLs, PTA. Feel pt will benefit from acute OT to increase independence, and strength, as well as reinforce energy conservation techniques prior to d/c. Recommending HHOT.    Follow Up Recommendations  Home health OT;Supervision - Intermittent    Equipment Recommendations  None recommended by OT    Recommendations for Other Services       Precautions / Restrictions Precautions Precautions: Fall Restrictions Weight Bearing Restrictions: No      Mobility Bed Mobility Overal bed mobility: Needs Assistance Bed Mobility: Supine to Sit;Sit to Supine     Supine to sit: Supervision Sit to supine: Supervision   General bed mobility comments: physical assist to scoot HOB  Transfers Overall transfer level: Needs assistance   Transfers: Sit to/from Stand Sit to Stand: Min guard         General transfer comment: RW in front for support    Balance  Used RW for ambulation in sesson. No falls in the last 4-5 years.                                           ADL Overall ADL's : Needs assistance/impaired     Grooming: Oral care;Wash/dry hands;Min guard;Standing (Min guard-supervision level)               Lower Body Dressing: Min guard;Sit to/from stand   Toilet Transfer: Min guard;Ambulation;RW (sit to stand from bed)           Functional mobility during ADLs: Min guard;Rolling walker General ADL Comments: Educated on energy conservation.  Mentioned sockaid-seemed  effortful managing socks.     Vision     Perception     Praxis      Pertinent Vitals/Pain Pain Assessment: No/denies pain; O2 remained in 90s with pt on RA. Took pt off of O2 towards beginning of session and monitored.      Hand Dominance     Extremity/Trunk Assessment Upper Extremity Assessment Upper Extremity Assessment: Generalized weakness   Lower Extremity Assessment Lower Extremity Assessment: Defer to PT evaluation       Communication Communication Communication: No difficulties   Cognition Arousal/Alertness: Awake/alert Behavior During Therapy: WFL for tasks assessed/performed Overall Cognitive Status: Within Functional Limits for tasks assessed                     General Comments       Exercises       Shoulder Instructions      Home Living Family/patient expects to be discharged to:: Private residence Living Arrangements: Spouse/significant other Available Help at Discharge: Family;Available PRN/intermittently Type of Home: House       Home Layout: One level     Bathroom Shower/Tub: Occupational psychologist: Standard     Home Equipment: Shower seat - built in;Walker - 2 wheels (have grab bars-not attached)          Prior Functioning/Environment Level of Independence: Needs assistance  Gait / Transfers Assistance Needed: uses  walker ADL's / Homemaking Assistance Needed: assist with shaving and wife gets container setup for pt to use for washing feet        OT Diagnosis: Generalized weakness   OT Problem List: Decreased strength;Decreased knowledge of use of DME or AE;Decreased knowledge of precautions;Decreased activity tolerance   OT Treatment/Interventions: DME and/or AE instruction;Therapeutic activities;Patient/family education;Balance training;Self-care/ADL training;Energy conservation;Therapeutic exercise    OT Goals(Current goals can be found in the care plan section) Acute Rehab OT Goals Patient Stated Goal:  not stated OT Goal Formulation: With patient Time For Goal Achievement: 08/25/15 Potential to Achieve Goals: Good ADL Goals Pt Will Perform Grooming: standing;with supervision (3 tasks; including gathering items) Pt Will Perform Lower Body Bathing: with supervision;sit to/from stand (including gathering items) Pt Will Perform Lower Body Dressing: with supervision;sit to/from stand (including gathering items) Pt Will Transfer to Toilet: ambulating;with modified independence;regular height toilet Additional ADL Goal #1: Pt will independently verbalize 3/3 energy conservation techniques and utilize as needed in session.  OT Frequency: Min 2X/week   Barriers to D/C:            Co-evaluation              End of Session Equipment Utilized During Treatment: Gait belt;Rolling walker  Activity Tolerance: Patient tolerated treatment well Patient left: in bed;with call bell/phone within reach;with bed alarm set;with family/visitor present (nurse tech in room)   Time: 7253-6644 OT Time Calculation (min): 19 min Charges:  OT General Charges $OT Visit: 1 Procedure OT Evaluation $Initial OT Evaluation Tier I: 1 Procedure G-CodesBenito Mccreedy OTR/L [id number] 08/18/2015, 4:03 PM

## 2015-08-18 NOTE — Progress Notes (Signed)
[facility] PROGRESS NOTE  CATALINO PLASCENCIA ZTI:[id number] DOB: 1931/05/28 DOA: 08/16/2015 PCP: Jonathon Bellows, MD  HPI/Brief narrative 81 y.o. male with Past medical history of COPD, hypertension, chronic systolic CHF, peripheral vascular disease, coronary artery disease, pacemaker implant, atrial fibrillation who presented with progressively worsening sob with increased cough.  Assessment/Plan: 1. CAP (community acquired pneumonia) Pt presented with complains of cough and shortness of breath. Chest x-ray shows new confluent L upper lobe opacity suspicious for PNA Remains without leukocytosis or fever. O2 requirements have improved and currently on 2LNC Pt was continued on empiric levofloxacin for pneumonia. F/u cultures as well as urine antigens.  2 Acute on chronic systolic CHF. 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 Cardiac pacemaker in situ- St Jude for CHB 7/09- pacer dependednt CAD s/p CABG '05, patent grafts 4/09, low risk Nuc 2011 BNP elevated at 2835 Patient was initially continued on '40mg'$  IV lasix qday Follow i/o and daily weights. Most recent documented echo noted to be in 2014. Repeat study pending Given rise in Cr, will transition back to home PO lasix dose on 11/1  5 HYPERCHOLESTEROLEMIA - Continuing home medications.  6 Essential hypertension Continue home medication blood pressure currently stable.  7 PAF CHADS 3 Amiodarone was added 10/14 Currently rate controlled Continued on Xarelto for secondary stroke prevention  8 Chronic anticoagulation Continuing anticoagulation with Xarelto for afib  9 Iron deficiency anemia Continue iron supplementation.  10. Hematuria Blood noted in foley bag Question traumatic foley insertion Cont to monitor CBC. Hgb remains stable  Nutrition: Regular diet DVT Prophylaxis: on therapeutic anticoagulation.  Code Status: DNR Family Communication: Pt in room Disposition Plan:  Pending   Consultants:    Procedures:    Antibiotics: Anti-infectives    Start     Dose/Rate Route Frequency Ordered Stop   08/17/15 2200  levofloxacin (LEVAQUIN) IVPB 750 mg     750 mg 100 mL/hr over 90 Minutes Intravenous Every 24 hours 08/16/15 2321 08/22/15 2159   08/16/15 2145  levofloxacin (LEVAQUIN) IVPB 750 mg     750 mg 100 mL/hr over 90 Minutes Intravenous  Once 08/16/15 2137 08/16/15 2343      HPI/Subjective: Feels better today. Denies chest pains  Objective: Filed Vitals:   08/17/15 1927 08/17/15 2133 08/18/15 0531 08/18/15 0852  BP:  107/50 127/67   Pulse:  61 65 70  Temp:  98.3 F (36.8 C) 98.3 F (36.8 C)   TempSrc:  Oral Oral   Resp:  '18 18 17  '$ Height:      Weight:      SpO2: 95% 96% 97% 98%    Intake/Output Summary (Last 24 hours) at 08/18/15 1520 Last data filed at 08/18/15 0648  Gross per 24 hour  Intake    510 ml  Output    700 ml  Net   -190 ml   Filed Weights   08/16/15 2319 08/17/15 0511  Weight: 86.1 kg (189 lb 13.1 oz) 86.2 kg (190 lb 0.6 oz)    Exam:   General:  Awake, sitting in bed eating, in nad  Cardiovascular: regular, s1, s2  Respiratory: normal resp effort, no wheezing  Abdomen: soft, nondistended, pos BS  Musculoskeletal: perfused, no clubbing   Data Reviewed: Basic Metabolic Panel:  Recent Labs Lab 08/16/15 1919 08/17/15 0035 08/17/15 0600 08/18/15 0436  NA 142  --  137 142  K 3.8  --  3.3* 4.0  CL 103  --  101 101  CO2 27  --  26  33*  GLUCOSE 104*  --  94 107*  BUN 23*  --  20 22*  CREATININE 1.14  --  0.98 1.30*  CALCIUM 9.2  --  8.4* 8.9  MG  --  1.9  --   --    Liver Function Tests:  Recent Labs Lab 08/17/15 0600  AST 17  ALT 13*  ALKPHOS 58  BILITOT 1.1  PROT 6.0*  ALBUMIN 3.2*   No results for input(s): LIPASE, AMYLASE in the last 168 hours. No results for input(s): AMMONIA in the last 168 hours. CBC:  Recent Labs Lab 08/16/15 1919 08/17/15 0600  WBC 9.0 10.4  NEUTROABS   --  8.4*  HGB 15.2 14.4  HCT 46.5 43.8  MCV 95.7 94.4  PLT 158 147*   Cardiac Enzymes:  Recent Labs Lab 08/17/15 0035 08/17/15 0600 08/17/15 1126  TROPONINI 0.06* 0.08* 0.09*   BNP (last 3 results)  Recent Labs  08/17/15 0035  BNP 2835.4*    ProBNP (last 3 results) No results for input(s): PROBNP in the last 8760 hours.  CBG:  Recent Labs Lab 08/17/15 2130  GLUCAP 127*    Recent Results (from the past 240 hour(s))  Culture, blood (routine x 2) Call MD if unable to obtain prior to antibiotics being given     Status: None (Preliminary result)   Collection Time: 08/17/15 12:28 AM  Result Value Ref Range Status   Specimen Description BLOOD RIGHT ANTECUBITAL  Final   Special Requests BOTTLES DRAWN AEROBIC AND ANAEROBIC 5CC   Final   Culture NO GROWTH 1 DAY  Final   Report Status PENDING  Incomplete  Culture, blood (routine x 2) Call MD if unable to obtain prior to antibiotics being given     Status: None (Preliminary result)   Collection Time: 08/17/15 12:35 AM  Result Value Ref Range Status   Specimen Description BLOOD RIGHT HAND  Final   Special Requests BOTTLES DRAWN AEROBIC ONLY 5CC  Final   Culture NO GROWTH 1 DAY  Final   Report Status PENDING  Incomplete     Studies: Dg Chest 2 View  08/16/2015  CLINICAL DATA:  Increased shortness breath for 1 week. History of COPD and CHF. EXAM: CHEST  2 VIEW COMPARISON:  Chest x-rays dated 11/08/2013 and 08/09/2013. FINDINGS: Mild cardiomegaly is unchanged. There is central pulmonary vascular congestion which appears similar to previous exams. There is now probable mild bilateral interstitial edema. Left chest wall pacemaker/AICD is unchanged in position. Median sternotomy wires appear intact and stable in alignment. New confluent airspace opacity is seen within the inferior segment of the left upper lobe extending towards the left hilum. Additional ill-defined opacity at the right lung base. IMPRESSION: 1. Cardiomegaly with  central pulmonary vascular congestion and mild bilateral interstitial edema suggesting mild CHF/volume overload. 2. New confluent airspace opacity within the inferior segment of the left upper lobe, extending towards the left hilum, which could represent pneumonia or asymmetric pulmonary edema. Favor pneumonia. 3. Additional opacity at the right lung base, more likely atelectasis and/or small effusion, but an additional basilar pneumonia cannot be excluded. These results were called by telephone at the time of interpretation on 08/16/2015 at 6:14 pm to Dr. Janne Napoleon , who verbally acknowledged these results. Electronically Signed   By: Franki Cabot M.D.   On: 08/16/2015 18:08    Scheduled Meds: . amiodarone  200 mg Oral Daily  . atorvastatin  20 mg Oral Daily  . budesonide-formoterol  2 puff Inhalation  BID  . docusate sodium  250 mg Oral QPC supper  . ferrous sulfate  325 mg Oral QPC supper  . [START ON 08/19/2015] furosemide  40 mg Oral Daily  . guaiFENesin  600 mg Oral BID  . levofloxacin (LEVAQUIN) IV  750 mg Intravenous Q24H  . losartan  100 mg Oral Q supper  . metoprolol tartrate  25 mg Oral BID  . montelukast  10 mg Oral QHS  . predniSONE  5 mg Oral Daily  . rivaroxaban  20 mg Oral Q supper  . tiotropium  18 mcg Inhalation QPC supper   Continuous Infusions:   Principal Problem:   CAP (community acquired pneumonia) Active Problems:   HYPERCHOLESTEROLEMIA   Essential hypertension   PAF- Amiodarone added 10/14   PVD - CEA '02, RCFA PTA '01   COPD (chronic obstructive pulmonary disease) ([place])   Cardiac pacemaker in situ- St Jude for CHB 7/09- pacer dependednt   Cardiomyopathy, ischemic- EF 35%   CAD s/p CABG '05, patent grafts 4/09, low risk Nuc 2011   Chronic anticoagulation   Iron deficiency anemia    CHIU, STEPHEN K  [facility] Pager [phone]. If 7PM-7AM, please contact night-coverage at [email or web address], password [facility] 08/18/2015, 3:20 PM  LOS: 2 days

## 2015-08-19 ENCOUNTER — Inpatient Hospital Stay (HOSPITAL_COMMUNITY): Payer: Commercial Managed Care - HMO

## 2015-08-19 DIAGNOSIS — I509 Heart failure, unspecified: Secondary | ICD-10-CM

## 2015-08-19 LAB — BASIC METABOLIC PANEL
ANION GAP: 8 (ref 5–15)
BUN: 25 mg/dL — AB (ref 6–20)
CALCIUM: 8.9 mg/dL (ref 8.9–10.3)
CO2: 31 mmol/L (ref 22–32)
Chloride: 102 mmol/L (ref 101–111)
Creatinine, Ser: 1.12 mg/dL (ref 0.61–1.24)
GFR calc Af Amer: >60 mL/min (ref >60)
GFR calc non Af Amer: 59 mL/min — ABNORMAL LOW (ref >60)
GLUCOSE: 112 mg/dL — AB (ref 65–99)
POTASSIUM: 4 mmol/L (ref 3.5–5.1)
Sodium: 141 mmol/L (ref 135–145)

## 2015-08-19 LAB — TROPONIN I: Troponin I: 0.06 ng/mL — ABNORMAL HIGH (ref <0.031)

## 2015-08-19 LAB — GLUCOSE, CAPILLARY: Glucose-Capillary: 119 mg/dL — ABNORMAL HIGH (ref 65–99)

## 2015-08-19 MED ORDER — OXYCODONE-ACETAMINOPHEN 5-325 MG PO TABS
1.0000 | ORAL_TABLET | ORAL | Status: DC | PRN
  Administered 2015-08-19 – 2015-08-20 (×2): 1 via ORAL
  Filled 2015-08-19 (×2): qty 1

## 2015-08-19 MED ORDER — CYCLOBENZAPRINE HCL 5 MG PO TABS
5.0000 mg | ORAL_TABLET | Freq: Three times a day (TID) | ORAL | Status: DC | PRN
  Administered 2015-08-19: 5 mg via ORAL
  Filled 2015-08-19: qty 1

## 2015-08-19 MED ORDER — ALUM & MAG HYDROXIDE-SIMETH 200-200-20 MG/5ML PO SUSP
30.0000 mL | Freq: Four times a day (QID) | ORAL | Status: DC | PRN
  Administered 2015-08-19 – 2015-08-20 (×3): 30 mL via ORAL
  Filled 2015-08-19 (×4): qty 30

## 2015-08-19 MED ORDER — FUROSEMIDE 10 MG/ML IJ SOLN
40.0000 mg | Freq: Once | INTRAMUSCULAR | Status: AC
  Administered 2015-08-19: 40 mg via INTRAVENOUS
  Filled 2015-08-19: qty 4

## 2015-08-19 MED ORDER — MAGNESIUM HYDROXIDE 400 MG/5ML PO SUSP: 30.0000 mL | Freq: Four times a day (QID) | ORAL | Status: DC | PRN

## 2015-08-19 MED ORDER — GI COCKTAIL ~~LOC~~
30.0000 mL | Freq: Once | ORAL | Status: AC
  Administered 2015-08-19: 30 mL via ORAL
  Filled 2015-08-19: qty 30

## 2015-08-19 NOTE — Progress Notes (Signed)
  Echocardiogram 2D Echocardiogram has been performed.  Todd Kelly 08/19/2015, 12:26 PM

## 2015-08-19 NOTE — Progress Notes (Signed)
PT Cancellation Note  Patient Details Name: Todd Kelly MRN: [id number] DOB: [birth year]   Cancelled Treatment:    Reason Eval/Treat Not Completed: Medical issues which prohibited therapy.  Per RN hold PT as pt is scheduled for chest x-ray today to r/o rib fx.  PT will continue to follow acutely and will complete evaluation once pt medically appropriate.  Joslyn Hy PT, DPT [phone] Pager: [phone] 08/19/2015, 10:57 AM

## 2015-08-19 NOTE — Progress Notes (Addendum)
[facility] PROGRESS NOTE  Todd Kelly JOA:[id number] DOB: Dec 06, 1930 DOA: 08/16/2015 PCP: Jonathon Bellows, MD  HPI/Brief narrative 81 y.o. male with Past medical history of COPD, hypertension, chronic systolic CHF, peripheral vascular disease, coronary artery disease, pacemaker implant, atrial fibrillation who presented with progressively worsening sob with increased cough.  Assessment/Plan: 1. CAP (community acquired pneumonia) Pt presented with complains of cough and shortness of breath. Chest x-ray showed new confluent L upper lobe opacity suspicious for PNA Patient remains without leukocytosis or fever. O2 requirements have improved and currently on 2LNC Pt is continued on empiric levofloxacin for pneumonia. Blood cx neg, legionella neg, strep pneumo neg  2 Acute on chronic systolic CHF. 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 Cardiac pacemaker in situ- St Jude for CHB 7/09- pacer dependednt CAD s/p CABG '05, patent grafts 4/09, low risk Nuc 2011 BNP was elevated at 2835 Patient was initially continued on '40mg'$  IV lasix qday Follow i/o and daily weights. Most recent documented echo noted to be in 2014. Repeat study pending Will continue on IV lasix for now Unknown dry weight as pt is actively trying to lose weight  5 HYPERCHOLESTEROLEMIA - Continuing home medications.  6 Essential hypertension Continue home medication blood pressure currently stable.  7 PAF CHADS 3 Amiodarone was added 10/14 Remains rate controlled Continued on Xarelto for secondary stroke prevention  8 Chronic anticoagulation Continuing anticoagulation with Xarelto for afib  9 Iron deficiency anemia Continue iron supplementation.  10. Hematuria Blood noted in foley bag Question traumatic foley insertion Cont to monitor CBC. Hgb remains stable  11. Chest pains Lower L chest pain  Repeat trop down to 0.06 from 0.09 CXR with findings of R fib fractures, discussed with radiology and  these appear to be chronic from past rib fx Will f/u with formal rib xray to r/o acute rib fracture  Nutrition: Heart healthy diet DVT Prophylaxis: on therapeutic anticoagulation.  Code Status: DNR Family Communication: Pt in room Disposition Plan: Pending   Consultants:    Procedures:    Antibiotics: Anti-infectives    Start     Dose/Rate Route Frequency Ordered Stop   08/17/15 2200  levofloxacin (LEVAQUIN) IVPB 750 mg     750 mg 100 mL/hr over 90 Minutes Intravenous Every 24 hours 08/16/15 2321 08/22/15 2159   08/16/15 2145  levofloxacin (LEVAQUIN) IVPB 750 mg     750 mg 100 mL/hr over 90 Minutes Intravenous  Once 08/16/15 2137 08/16/15 2343      HPI/Subjective: Complains of lower L chest pain worse with palpation  Objective: Filed Vitals:   08/19/15 0546 08/19/15 0546 08/19/15 0907 08/19/15 1359  BP:  130/65  103/56  Pulse:  64 64 65  Temp:  97.8 F (36.6 C)  98.5 F (36.9 C)  TempSrc:  Oral  Oral  Resp:  '18 20 20  '$ Height:      Weight: 83.7 kg (184 lb 8.4 oz)     SpO2:  93% 94% 98%    Intake/Output Summary (Last 24 hours) at 08/19/15 1511 Last data filed at 08/19/15 1446  Gross per 24 hour  Intake    862 ml  Output   2525 ml  Net  -1663 ml   Filed Weights   08/16/15 2319 08/17/15 0511 08/19/15 0546  Weight: 86.1 kg (189 lb 13.1 oz) 86.2 kg (190 lb 0.6 oz) 83.7 kg (184 lb 8.4 oz)    Exam:   General:  Awake, laying in bed, in nad  Cardiovascular: regular, s1, s2  Respiratory: normal resp effort, no wheezing  Abdomen: soft, nondistended, pos BS  Musculoskeletal: perfused, no clubbing, no cyanosis  Data Reviewed: Basic Metabolic Panel:  Recent Labs Lab 08/16/15 1919 08/17/15 0035 08/17/15 0600 08/18/15 0436 08/19/15 0551  NA 142  --  137 142 141  K 3.8  --  3.3* 4.0 4.0  CL 103  --  101 101 102  CO2 27  --  26 33* 31  GLUCOSE 104*  --  94 107* 112*  BUN 23*  --  20 22* 25*  CREATININE 1.14  --  0.98 1.30* 1.12  CALCIUM 9.2  --   8.4* 8.9 8.9  MG  --  1.9  --   --   --    Liver Function Tests:  Recent Labs Lab 08/17/15 0600  AST 17  ALT 13*  ALKPHOS 58  BILITOT 1.1  PROT 6.0*  ALBUMIN 3.2*   No results for input(s): LIPASE, AMYLASE in the last 168 hours. No results for input(s): AMMONIA in the last 168 hours. CBC:  Recent Labs Lab 08/16/15 1919 08/17/15 0600  WBC 9.0 10.4  NEUTROABS  --  8.4*  HGB 15.2 14.4  HCT 46.5 43.8  MCV 95.7 94.4  PLT 158 147*   Cardiac Enzymes:  Recent Labs Lab 08/17/15 0035 08/17/15 0600 08/17/15 1126 08/19/15 1115  TROPONINI 0.06* 0.08* 0.09* 0.06*   BNP (last 3 results)  Recent Labs  08/17/15 0035  BNP 2835.4*    ProBNP (last 3 results) No results for input(s): PROBNP in the last 8760 hours.  CBG:  Recent Labs Lab 08/17/15 2130 08/19/15 0910  GLUCAP 127* 119*    Recent Results (from the past 240 hour(s))  Culture, blood (routine x 2) Call MD if unable to obtain prior to antibiotics being given     Status: None (Preliminary result)   Collection Time: 08/17/15 12:28 AM  Result Value Ref Range Status   Specimen Description BLOOD RIGHT ANTECUBITAL  Final   Special Requests BOTTLES DRAWN AEROBIC AND ANAEROBIC 5CC   Final   Culture NO GROWTH 2 DAYS  Final   Report Status PENDING  Incomplete  Culture, blood (routine x 2) Call MD if unable to obtain prior to antibiotics being given     Status: None (Preliminary result)   Collection Time: 08/17/15 12:35 AM  Result Value Ref Range Status   Specimen Description BLOOD RIGHT HAND  Final   Special Requests BOTTLES DRAWN AEROBIC ONLY 5CC  Final   Culture NO GROWTH 2 DAYS  Final   Report Status PENDING  Incomplete     Studies: Dg Chest Port 1 View  08/19/2015  CLINICAL DATA:  82 year old male with left chest pain. Rib fractures. EXAM: PORTABLE CHEST 1 VIEW COMPARISON:  08/16/2015 and prior chest radiographs FINDINGS: Cardiomegaly, CABG changes and left-sided pacemaker again noted. Focal left upper lobe  opacity/atelectasis/ airspace disease is unchanged. Improved right basilar aeration noted with mild right basilar atelectasis present. There is no evidence of pneumothorax. There is a trace right pleural effusion present. Fractures of the right fifth through eighth ribs are noted. IMPRESSION: Improved right basilar aeration without other significant change. Continued left upper lobe opacity which may represent atelectasis or airspace disease. Fractures of the right fifth through eighth ribs. Trace right pleural effusion. Electronically Signed   By: Margarette Canada M.D.   On: 08/19/2015 13:28    Scheduled Meds: . amiodarone  200 mg Oral Daily  . atorvastatin  20 mg Oral Daily  .  budesonide-formoterol  2 puff Inhalation BID  . docusate sodium  250 mg Oral QPC supper  . ferrous sulfate  325 mg Oral QPC supper  . guaiFENesin  600 mg Oral BID  . levofloxacin (LEVAQUIN) IV  750 mg Intravenous Q24H  . losartan  100 mg Oral Q supper  . metoprolol tartrate  25 mg Oral BID  . montelukast  10 mg Oral QHS  . predniSONE  5 mg Oral Daily  . rivaroxaban  20 mg Oral Q supper  . tiotropium  18 mcg Inhalation QPC supper   Continuous Infusions:   Principal Problem:   CAP (community acquired pneumonia) Active Problems:   HYPERCHOLESTEROLEMIA   Essential hypertension   PAF- Amiodarone added 10/14   PVD - CEA '02, RCFA PTA '01   COPD (chronic obstructive pulmonary disease) ([place])   Cardiac pacemaker in situ- St Jude for CHB 7/09- pacer dependednt   Cardiomyopathy, ischemic- EF 35%   CAD s/p CABG '05, patent grafts 4/09, low risk Nuc 2011   Chronic anticoagulation   Iron deficiency anemia    Darald Uzzle K  [facility] Pager [phone]. If 7PM-7AM, please contact night-coverage at [email or web address], password [facility] 08/19/2015, 3:11 PM  LOS: 3 days

## 2015-08-20 DIAGNOSIS — J9600 Acute respiratory failure, unspecified whether with hypoxia or hypercapnia: Secondary | ICD-10-CM

## 2015-08-20 DIAGNOSIS — I5041 Acute combined systolic (congestive) and diastolic (congestive) heart failure: Secondary | ICD-10-CM

## 2015-08-20 DIAGNOSIS — I4891 Unspecified atrial fibrillation: Secondary | ICD-10-CM

## 2015-08-20 DIAGNOSIS — J9601 Acute respiratory failure with hypoxia: Secondary | ICD-10-CM

## 2015-08-20 DIAGNOSIS — I255 Ischemic cardiomyopathy: Secondary | ICD-10-CM

## 2015-08-20 DIAGNOSIS — J189 Pneumonia, unspecified organism: Principal | ICD-10-CM

## 2015-08-20 DIAGNOSIS — J439 Emphysema, unspecified: Secondary | ICD-10-CM

## 2015-08-20 DIAGNOSIS — Z950 Presence of cardiac pacemaker: Secondary | ICD-10-CM

## 2015-08-20 DIAGNOSIS — I10 Essential (primary) hypertension: Secondary | ICD-10-CM

## 2015-08-20 LAB — BASIC METABOLIC PANEL
Anion gap: 14 (ref 5–15)
BUN: 23 mg/dL — ABNORMAL HIGH (ref 6–20)
CO2: 27 mmol/L (ref 22–32)
CREATININE: 1.05 mg/dL (ref 0.61–1.24)
Calcium: 8.7 mg/dL — ABNORMAL LOW (ref 8.9–10.3)
Chloride: 99 mmol/L — ABNORMAL LOW (ref 101–111)
GFR calc non Af Amer: >60 mL/min (ref >60)
Glucose, Bld: 200 mg/dL — ABNORMAL HIGH (ref 65–99)
Potassium: 3.1 mmol/L — ABNORMAL LOW (ref 3.5–5.1)
SODIUM: 140 mmol/L (ref 135–145)

## 2015-08-20 LAB — CBC
HCT: 42.2 % (ref 39.0–52.0)
Hemoglobin: 13.5 g/dL (ref 13.0–17.0)
MCH: 30.4 pg (ref 26.0–34.0)
MCHC: 32 g/dL (ref 30.0–36.0)
MCV: 95 fL (ref 78.0–100.0)
PLATELETS: 154 10*3/uL (ref 150–400)
RBC: 4.44 MIL/uL (ref 4.22–5.81)
RDW: 14.4 % (ref 11.5–15.5)
WBC: 13.2 10*3/uL — AB (ref 4.0–10.5)

## 2015-08-20 MED ORDER — LEVOFLOXACIN 750 MG PO TABS: 750.0000 mg | ORAL_TABLET | Freq: Every day | ORAL | Status: DC

## 2015-08-20 MED ORDER — ALUM HYDROXIDE-MAG TRISILICATE 80-20 MG PO CHEW
1.0000 | CHEWABLE_TABLET | Freq: Three times a day (TID) | ORAL | Status: DC | PRN
  Administered 2015-08-20: 1 via ORAL
  Filled 2015-08-20 (×2): qty 1

## 2015-08-20 MED ORDER — ALUM HYDROXIDE-MAG TRISILICATE 80-20 MG PO CHEW
1.0000 | CHEWABLE_TABLET | Freq: Once | ORAL | Status: AC
  Filled 2015-08-20: qty 1

## 2015-08-20 MED ORDER — POTASSIUM CHLORIDE CRYS ER 20 MEQ PO TBCR
40.0000 meq | EXTENDED_RELEASE_TABLET | Freq: Once | ORAL | Status: AC
  Administered 2015-08-20: 40 meq via ORAL
  Filled 2015-08-20: qty 2

## 2015-08-20 NOTE — Progress Notes (Signed)
PT Screen note:  Note pt for D/C today, per pt and nurse tech, pt has ambulated in hallways with RW at mod I level and no changes in sats.  PT to sign off at this time.    Thanks,  Cameron Sprang, PT, MPT [facility] [address] [place], [place], [address] Phone: [phone]   Fax:  [phone] 08/20/2015, 2:59 PM

## 2015-08-20 NOTE — Progress Notes (Signed)
Nsg Discharge Note  Admit Date:  08/16/2015 Discharge date: 08/20/2015   Todd Kelly to be D/C'd Home per MD order.  AVS completed.  Copy for chart, and copy for patient signed, and dated. Patient/caregiver able to verbalize understanding.  Discharge Medication:   Medication List    STOP taking these medications        guaiFENesin 600 MG 12 hr tablet  Commonly known as:  [place] these medications        ALPRAZolam 0.5 MG tablet  Commonly known as:  XANAX  Take 1 tablet (0.5 mg total) by mouth 3 (three) times daily as needed for anxiety. Not to exceed 3 per day.     amiodarone 200 MG tablet  Commonly known as:  PACERONE  Take 1 tablet (200 mg total) by mouth daily.     atorvastatin 20 MG tablet  Commonly known as:  LIPITOR  Take 1 tablet (20 mg total) by mouth daily.     CoQ10 100 MG Caps  Take 100 mg by mouth daily.     docusate sodium 250 MG capsule  Commonly known as:  COLACE  Take 250 mg by mouth daily after supper.     ferrous sulfate 325 (65 FE) MG tablet  Take 325 mg by mouth daily after supper.     Fish Oil 1000 MG Caps  Take 1,000 mg by mouth daily after supper.     fluticasone 50 MCG/ACT nasal spray  Commonly known as:  FLONASE  USE ONE SPRAY IN EACH NOSTRIL TWICE A DAY     furosemide 40 MG tablet  Commonly known as:  LASIX  Take 1 tablet (40 mg total) by mouth daily.     HYDROcodone-acetaminophen 5-325 MG tablet  Commonly known as:  NORCO/VICODIN  Take 1/2 to 1 tablet by mouth three times daily as needed for pain     levofloxacin 750 MG tablet  Commonly known as:  LEVAQUIN  Take 1 tablet (750 mg total) by mouth daily.     losartan 100 MG tablet  Commonly known as:  COZAAR  Take 1 tablet (100 mg total) by mouth at bedtime.     meclizine 25 MG tablet  Commonly known as:  ANTIVERT  Take 1/2 to 1 tablet by mouth every 6 hours as needed for dizziness     metoprolol tartrate 25 MG tablet  Commonly known as:  LOPRESSOR  Take 1  tablet (25 mg total) by mouth 2 (two) times daily.     montelukast 10 MG tablet  Commonly known as:  SINGULAIR  TAKE 1 TABLET AT BEDTIME     MUCINEX DM 30-600 MG Tb12  Take 1 tablet by mouth 2 (two) times daily.     potassium chloride 10 MEQ tablet  Commonly known as:  K-DUR  Take 20 mEq by mouth daily as needed (if taking lasix).     predniSONE 5 MG tablet  Commonly known as:  DELTASONE  Take 5 mg by mouth daily.     ranitidine 150 MG tablet  Commonly known as:  ZANTAC  Take 1 tablet (150 mg total) by mouth 2 (two) times daily.     rivaroxaban 20 MG Tabs tablet  Commonly known as:  XARELTO  Take 1 tablet (20 mg total) by mouth daily with supper.     SYMBICORT 160-4.5 MCG/ACT inhaler  Generic drug:  budesonide-formoterol  USE 2 INHALATIONS TWICE A DAY     SYSTANE OP  Place 1 drop into both eyes 4 (four) times daily.     tiotropium 18 MCG inhalation capsule  Commonly known as:  SPIRIVA HANDIHALER  INHALE THE CONTENTS OF 1 CAPSULE DAILY     vitamin B-12 500 MCG tablet  Commonly known as:  CYANOCOBALAMIN  Take 500 mcg by mouth daily after supper.     vitamin C 500 MG tablet  Commonly known as:  ASCORBIC ACID  Take 500 mg by mouth daily after supper.        Discharge Assessment: Filed Vitals:   08/20/15 0510  BP: 130/69  Pulse: 60  Temp: 98 F (36.7 C)  Resp: 20   Skin clean, dry and intact without evidence of skin break down, no evidence of skin tears noted. IV catheter discontinued intact. Site without signs and symptoms of complications - no redness or edema noted at insertion site, patient denies c/o pain - only slight tenderness at site.  Dressing with slight pressure applied.  D/c Instructions-Education: Discharge instructions given to patient/family with verbalized understanding. D/c education completed with patient/family including follow up instructions, medication list, d/c activities limitations if indicated, with other d/c instructions as indicated  by MD - patient able to verbalize understanding, all questions fully answered. Patient instructed to return to ED, call 911, or call MD for any changes in condition.  Patient escorted via [place], and D/C home via private auto.  Dayle Points, RN 08/20/2015 6:27 PM

## 2015-08-20 NOTE — Progress Notes (Signed)
SATURATION QUALIFICATIONS: (This note is used to comply with regulatory documentation for home oxygen)  Patient Saturations on Room Air at Rest = 96-100%  Patient Saturations on Room Air while Ambulating = 100%

## 2015-08-20 NOTE — Discharge Summary (Addendum)
Physician Discharge Summary  Todd Kelly SFK:[id number] DOB: [birth year] DOA: 08/16/2015  PCP: Jonathon Bellows, MD  Admit date: 08/16/2015 Discharge date: 08/20/2015  Time spent: 60 minutes   Discharge Condition: stable   Discharge Diagnoses:  Principal Problem:   Acute respiratory failure ([place]) Active Problems:   Acute combined systolic and diastolic congestive heart failure ([place])   CAP (community acquired pneumonia)   HYPERCHOLESTEROLEMIA   Essential hypertension   PAF- Amiodarone added 10/14   PVD - CEA '02, RCFA PTA '01   COPD (chronic obstructive pulmonary disease) ([place])   Cardiac pacemaker in situ- [employer] for CHB 7/09- pacer dependednt   Cardiomyopathy, ischemic- EF 35%   CAD s/p CABG '05, patent grafts 4/09, low risk Nuc 2011   Chronic anticoagulation   Iron deficiency anemia   History of present illness:  This is an 81 year old male with history of COPD, hypertension, chronic systolic heart failure, peripheral vascular disease, coronary artery disease, atrial fibrillation who has a pacemaker. The patient presented to the hospital with increasing cough and shortness of breath. He was admitted for community-acquired pneumonia and acute on chronic heart failure.  Hospital Course:  Acute respiratory failure (A) community acquired pneumonia-initial chest x-ray in the ER revealed a left upper lobe opacity-symptoms improved with levofloxacin- has a mild cough remaining- no dyspnea at rest or with exertion- will complete a 7 day course of Levaquin  (B) acute on chronic systolic heart failure-improved with diuretics-weight has dropped from 86 kg to 83 kg- advised to take a low-sodium diet, continue Lasix, losartan, metoprolol and weight himself daily-echo reveals an EF of 17-49%, and diastolic heart failure which is unchanged when compared to his last echo 2 years ago-see complete report below  -Oxygen saturation on room air now 99-100% at rest and with exertion  COPD - no  exacerbation noted during the hospital stay  Paroxysmal atrial fibrillation - CHA2DS2-VASc Score 4 -Continue Xarelto-rate controlled on amiodarone and metoprolol  Hematuria -Likely traumatic secondary to Foley insertion in setting of Xarelto use-resolved  Essential hypertension -Controlled on home medications  Elevated troponin/ left lower chest pain - Troponin ranging from 0.06 to 0.09 which is non-specific and likely related to CHF exacerbation and above mentioned respiratory failure - initial CXR was concerning for rib fractures but repeat CXR revealed that the fractures are old   Procedures: Left ventricle: Global hypokinesis and inferior akinesis. The cavity size was moderately dilated. There was mild concentric hypertrophy. Systolic function was moderately to severely reduced. The estimated ejection fraction was in the range of 30% to 35%. Doppler parameters are consistent with restrictive physiology, indicative of decreased left ventricular diastolic compliance and/or increased left atrial pressure. Doppler parameters are consistent with elevated ventricular end-diastolic filling pressure. - Aortic valve: Transvalvular velocity was minimally increased. There was mild stenosis. Valve area (VTI): 1.82 cm^2. Valve area (Vmax): 1.96 cm^2. Valve area (Vmean): 1.55 cm^2. - Aortic root: The aortic root was normal in size. - Ascending aorta: The ascending aorta was normal in size. - Left atrium: The atrium was severely dilated. - Right atrium: The atrium was mildly dilated. - Tricuspid valve: There was mild regurgitation. - Pulmonary arteries: Systolic pressure was mildly increased. PA peak pressure: 36 mm Hg (S). - Line: A venous catheter was visualized in the superior vena cava, with its tip in the right atrium. No abnormal features noted. - Inferior vena cava: The vessel was normal in size. - Pericardium, extracardiac: There was no pericardial  effusion  Discharge Exam:  Filed Weights   08/17/15 0511 08/19/15 0546 08/20/15 0510  Weight: 86.2 kg (190 lb 0.6 oz) 83.7 kg (184 lb 8.4 oz) 83.9 kg (184 lb 15.5 oz)   Filed Vitals:   08/20/15 0510  BP: 130/69  Pulse: 60  Temp: 98 F (36.7 C)  Resp: 20    General: AAO x 3, no distress Cardiovascular: RRR, no murmurs  Respiratory: clear to auscultation bilaterally GI: soft, non-tender, non-distended, bowel sound positive  Discharge Instructions You were cared for by a hospitalist during your hospital stay. If you have any questions about your discharge medications or the care you received while you were in the hospital after you are discharged, you can call the unit and asked to speak with the hospitalist on call if the hospitalist that took care of you is not available. Once you are discharged, your primary care physician will handle any further medical issues. Please note that NO REFILLS for any discharge medications will be authorized once you are discharged, as it is imperative that you return to your primary care physician (or establish a relationship with a primary care physician if you do not have one) for your aftercare needs so that they can reassess your need for medications and monitor your lab values.      Discharge Instructions    Diet - low sodium heart healthy    Complete by:  As directed      Increase activity slowly    Complete by:  As directed             Medication List    STOP taking these medications        guaiFENesin 600 MG 12 hr tablet  Commonly known as:  [place] these medications        ALPRAZolam 0.5 MG tablet  Commonly known as:  XANAX  Take 1 tablet (0.5 mg total) by mouth 3 (three) times daily as needed for anxiety. Not to exceed 3 per day.     amiodarone 200 MG tablet  Commonly known as:  PACERONE  Take 1 tablet (200 mg total) by mouth daily.     atorvastatin 20 MG tablet  Commonly known as:  LIPITOR  Take 1 tablet (20 mg  total) by mouth daily.     CoQ10 100 MG Caps  Take 100 mg by mouth daily.     docusate sodium 250 MG capsule  Commonly known as:  COLACE  Take 250 mg by mouth daily after supper.     ferrous sulfate 325 (65 FE) MG tablet  Take 325 mg by mouth daily after supper.     Fish Oil 1000 MG Caps  Take 1,000 mg by mouth daily after supper.     fluticasone 50 MCG/ACT nasal spray  Commonly known as:  FLONASE  USE ONE SPRAY IN EACH NOSTRIL TWICE A DAY     furosemide 40 MG tablet  Commonly known as:  LASIX  Take 1 tablet (40 mg total) by mouth daily.     HYDROcodone-acetaminophen 5-325 MG tablet  Commonly known as:  NORCO/VICODIN  Take 1/2 to 1 tablet by mouth three times daily as needed for pain     levofloxacin 750 MG tablet  Commonly known as:  LEVAQUIN  Take 1 tablet (750 mg total) by mouth daily.     losartan 100 MG tablet  Commonly known as:  COZAAR  Take 1 tablet (100 mg total) by mouth at bedtime.  meclizine 25 MG tablet  Commonly known as:  ANTIVERT  Take 1/2 to 1 tablet by mouth every 6 hours as needed for dizziness     metoprolol tartrate 25 MG tablet  Commonly known as:  LOPRESSOR  Take 1 tablet (25 mg total) by mouth 2 (two) times daily.     montelukast 10 MG tablet  Commonly known as:  SINGULAIR  TAKE 1 TABLET AT BEDTIME     MUCINEX DM 30-600 MG Tb12  Take 1 tablet by mouth 2 (two) times daily.     potassium chloride 10 MEQ tablet  Commonly known as:  K-DUR  Take 20 mEq by mouth daily as needed (if taking lasix).     predniSONE 5 MG tablet  Commonly known as:  DELTASONE  Take 5 mg by mouth daily.     ranitidine 150 MG tablet  Commonly known as:  ZANTAC  Take 1 tablet (150 mg total) by mouth 2 (two) times daily.     rivaroxaban 20 MG Tabs tablet  Commonly known as:  XARELTO  Take 1 tablet (20 mg total) by mouth daily with supper.     SYMBICORT 160-4.5 MCG/ACT inhaler  Generic drug:  budesonide-formoterol  USE 2 INHALATIONS TWICE A DAY      SYSTANE OP  Place 1 drop into both eyes 4 (four) times daily.     tiotropium 18 MCG inhalation capsule  Commonly known as:  SPIRIVA HANDIHALER  INHALE THE CONTENTS OF 1 CAPSULE DAILY     vitamin B-12 500 MCG tablet  Commonly known as:  CYANOCOBALAMIN  Take 500 mcg by mouth daily after supper.     vitamin C 500 MG tablet  Commonly known as:  ASCORBIC ACID  Take 500 mg by mouth daily after supper.       Allergies  Allergen Reactions  . Penicillins Anaphylaxis and Hives    Has patient had a PCN reaction causing immediate rash, facial/tongue/throat swelling, SOB or lightheadedness with hypotension: Yes Has patient had a PCN reaction causing severe rash involving mucus membranes or skin necrosis: No Has patient had a PCN reaction that required hospitalization Yes Has patient had a PCN reaction occurring within the last 10 years: No If all of the above answers are "NO", then may proceed with Cephalosporin use.   Follow-up Information    Follow up with Jonathon Bellows, MD On 08/26/2015.   Specialty:  Family Medicine   Why:  On 08/26/15 @ 10:15am. Please remember to bring hospital discharge paperwork.   Contact information:   [place] [address] [phone]       Follow up with [facility].   Why:  for bedside commode. will be delivered to room prior to discharge.   Contact information:   [address] [place] [address] [phone]       Follow up with [facility].   Why:  PT and RN, will call 24 to 48 hours after discharge to set up first home appointment.   Contact information:   [address] [place] [address] [phone]        The results of significant diagnostics from this hospitalization (including imaging, microbiology, ancillary and laboratory) are listed below for reference.    Significant Diagnostic Studies: Dg Chest 2 View  08/16/2015  CLINICAL DATA:  Increased  shortness breath for 1 week. History of COPD and CHF. EXAM: CHEST  2 VIEW COMPARISON:  Chest x-rays  dated 11/08/2013 and 08/09/2013. FINDINGS: Mild cardiomegaly is unchanged. There is central pulmonary vascular congestion which appears similar to previous exams. There is now probable mild bilateral interstitial edema. Left chest wall pacemaker/AICD is unchanged in position. Median sternotomy wires appear intact and stable in alignment. New confluent airspace opacity is seen within the inferior segment of the left upper lobe extending towards the left hilum. Additional ill-defined opacity at the right lung base. IMPRESSION: 1. Cardiomegaly with central pulmonary vascular congestion and mild bilateral interstitial edema suggesting mild CHF/volume overload. 2. New confluent airspace opacity within the inferior segment of the left upper lobe, extending towards the left hilum, which could represent pneumonia or asymmetric pulmonary edema. Favor pneumonia. 3. Additional opacity at the right lung base, more likely atelectasis and/or small effusion, but an additional basilar pneumonia cannot be excluded. These results were called by telephone at the time of interpretation on 08/16/2015 at 6:14 pm to Dr. Janne Napoleon , who verbally acknowledged these results. Electronically Signed   By: Franki Cabot M.D.   On: 08/16/2015 18:08   Dg Ribs Bilateral W/chest  08/19/2015  CLINICAL DATA:  81 year old with injury to the anterior lower left ribs 2 days ago. Initial encounter. Personal history of bilateral rib fractures. EXAM: BILATERAL RIBS AND CHEST - 4+ VIEW 1819 hr: COMPARISON:  Chest x-rays earlier same date 1139 hr and previously. No prior rib imaging. FINDINGS: Site of maximum pain and tenderness was marked with a metallic BB. No acute rib fracture identified. Old healed fractures involving the left lateral 8th rib and the right lateral 5th, 6th, 7th and 8th ribs and the right anterior 9th rib. Prior sternotomy for CABG.  Cardiac silhouette markedly enlarged, unchanged. Further improvement in aeration in the right lower lobe since earlier today, with mild atelectasis persisting. Streaky and patchy airspace opacity in the left upper lobe, unchanged. No new pulmonary parenchymal abnormalities. Mild pulmonary venous hypertension without overt edema. Left subclavian dual lead transvenous pacemaker unchanged and intact. IMPRESSION: 1. No acute rib fractures identified. 2. Old healed bilateral rib fractures as detailed above. 3. Further improvement in aeration in the right lung base with mild atelectasis persisting. 4. Stable left upper lobe atelectasis and/or bronchopneumonia. 5. No new abnormalities. Electronically Signed   By: Evangeline Dakin M.D.   On: 08/19/2015 18:38   Dg Chest Port 1 View  08/19/2015  CLINICAL DATA:  81 year old male with left chest pain. Rib fractures. EXAM: PORTABLE CHEST 1 VIEW COMPARISON:  08/16/2015 and prior chest radiographs FINDINGS: Cardiomegaly, CABG changes and left-sided pacemaker again noted. Focal left upper lobe opacity/atelectasis/ airspace disease is unchanged. Improved right basilar aeration noted with mild right basilar atelectasis present. There is no evidence of pneumothorax. There is a trace right pleural effusion present. Fractures of the right fifth through eighth ribs are noted. IMPRESSION: Improved right basilar aeration without other significant change. Continued left upper lobe opacity which may represent atelectasis or airspace disease. Fractures of the right fifth through eighth ribs. Trace right pleural effusion. Electronically Signed   By: Margarette Canada M.D.   On: 08/19/2015 13:28    Microbiology: Recent Results (from the past 240 hour(s))  Culture, blood (routine x 2) Call MD if unable to obtain prior to antibiotics being given     Status: None (Preliminary result)   Collection Time: 08/17/15 12:28 AM  Result Value Ref Range Status   Specimen Description BLOOD RIGHT  ANTECUBITAL  Final   Special Requests BOTTLES DRAWN AEROBIC AND ANAEROBIC 5CC  Final   Culture NO GROWTH 2 DAYS  Final   Report Status PENDING  Incomplete  Culture, blood (routine x 2) Call MD if unable to obtain prior to antibiotics being given     Status: None (Preliminary result)   Collection Time: 08/17/15 12:35 AM  Result Value Ref Range Status   Specimen Description BLOOD RIGHT HAND  Final   Special Requests BOTTLES DRAWN AEROBIC ONLY 5CC  Final   Culture NO GROWTH 2 DAYS  Final   Report Status PENDING  Incomplete     Labs: Basic Metabolic Panel:  Recent Labs Lab 08/16/15 1919 08/17/15 0035 08/17/15 0600 08/18/15 0436 08/19/15 0551 08/20/15 0525  NA 142  --  137 142 141 140  K 3.8  --  3.3* 4.0 4.0 3.1*  CL 103  --  101 101 102 99*  CO2 27  --  26 33* 31 27  GLUCOSE 104*  --  94 107* 112* 200*  BUN 23*  --  20 22* 25* 23*  CREATININE 1.14  --  0.98 1.30* 1.12 1.05  CALCIUM 9.2  --  8.4* 8.9 8.9 8.7*  MG  --  1.9  --   --   --   --    Liver Function Tests:  Recent Labs Lab 08/17/15 0600  AST 17  ALT 13*  ALKPHOS 58  BILITOT 1.1  PROT 6.0*  ALBUMIN 3.2*   No results for input(s): LIPASE, AMYLASE in the last 168 hours. No results for input(s): AMMONIA in the last 168 hours. CBC:  Recent Labs Lab 08/16/15 1919 08/17/15 0600 08/20/15 1005  WBC 9.0 10.4 13.2*  NEUTROABS  --  8.4*  --   HGB 15.2 14.4 13.5  HCT 46.5 43.8 42.2  MCV 95.7 94.4 95.0  PLT 158 147* 154   Cardiac Enzymes:  Recent Labs Lab 08/17/15 0035 08/17/15 0600 08/17/15 1126 08/19/15 1115  TROPONINI 0.06* 0.08* 0.09* 0.06*   BNP: BNP (last 3 results)  Recent Labs  08/17/15 0035  BNP 2835.4*    ProBNP (last 3 results) No results for input(s): PROBNP in the last 8760 hours.  CBG:  Recent Labs Lab 08/17/15 2130 08/19/15 0910  GLUCAP 127* 119*       SignedDebbe Odea, MD [facility] 08/20/2015, 12:35 PM

## 2015-08-20 NOTE — Care Management Note (Signed)
Case Management Note  Patient Details  Name: Todd Kelly MRN: [id number] Date of Birth: [birth year]  Subjective/Objective:                  Date-08-20-15 Initial Assessment . Spoke with patient at the bedside.  Introduced self as Tourist information centre manager and explained role in discharge planning and how to be reached.   Verified patient anticipates to go home with spouse  at time of discharge and will have part-time supervision by family  at this time to best of their knowledge.  Patient has DME cane walker. Expressed potential need for no other DME.  Patient denied   needing help with their medication.  Patient states they currently receive [facility] services through no one.  Patient was provided choice and selected [facility], however he was denied due to insurance,  for home health needs if they arise.  Plan: CM will continue to follow for discharge planning and [facility] resources.   Carles Collet RN BSN CM [phone]   Action/Plan:  Referral made to [facility] for [facility] services.   Expected Discharge Date:                  Expected Discharge Plan:  [place]  In-House Referral:     Discharge planning Services  CM Consult  Post Acute Care Choice:  Home Health, Durable Medical Equipment Choice offered to:  Patient  DME Arranged:  Bedside commode DME Agency:  [place] Arranged:  RN, PT [facility] Agency:  [facility]  Status of Service:  Completed, signed off  Medicare Important Message Given:    Date Medicare IM Given:    Medicare IM give by:    Date Additional Medicare IM Given:    Additional Medicare Important Message give by:     If discussed at [place] of Stay Meetings, dates discussed:    Additional Comments:  Carles Collet, RN 08/20/2015, 12:09 PM

## 2015-08-22 LAB — CULTURE, BLOOD (ROUTINE X 2)
CULTURE: NO GROWTH
Culture: NO GROWTH

## 2015-08-26 ENCOUNTER — Other Ambulatory Visit: Payer: Self-pay | Admitting: Family Medicine

## 2015-08-26 ENCOUNTER — Ambulatory Visit
Admission: RE | Admit: 2015-08-26 | Discharge: 2015-08-26 | Disposition: A | Discharge status: Home / Self Care | Payer: Commercial Managed Care - HMO | Source: Ambulatory Visit | Attending: Family Medicine | Admitting: Family Medicine

## 2015-08-26 DIAGNOSIS — J189 Pneumonia, unspecified organism: Secondary | ICD-10-CM

## 2015-08-26 DIAGNOSIS — J181 Lobar pneumonia, unspecified organism: Secondary | ICD-10-CM

## 2015-08-26 DIAGNOSIS — R9389 Abnormal findings on diagnostic imaging of other specified body structures: Secondary | ICD-10-CM

## 2015-08-27 ENCOUNTER — Ambulatory Visit
Admission: RE | Admit: 2015-08-27 | Discharge: 2015-08-27 | Disposition: A | Discharge status: Home / Self Care | Payer: Commercial Managed Care - HMO | Source: Ambulatory Visit | Attending: Family Medicine | Admitting: Family Medicine

## 2015-08-27 DIAGNOSIS — J181 Lobar pneumonia, unspecified organism: Secondary | ICD-10-CM

## 2015-08-27 DIAGNOSIS — R9389 Abnormal findings on diagnostic imaging of other specified body structures: Secondary | ICD-10-CM

## 2015-08-27 MED ORDER — IOPAMIDOL (ISOVUE-300) INJECTION 61%
75.0000 mL | Freq: Once | INTRAVENOUS | Status: AC | PRN
  Administered 2015-08-27: 75 mL via INTRAVENOUS

## 2015-08-28 ENCOUNTER — Telehealth: Payer: Self-pay | Admitting: *Deleted

## 2015-08-28 DIAGNOSIS — R918 Other nonspecific abnormal finding of lung field: Secondary | ICD-10-CM

## 2015-08-28 NOTE — Telephone Encounter (Signed)
Oncology Nurse Navigator Documentation  Oncology Nurse Navigator Flowsheets 08/28/2015  Referral date to RadOnc/MedOnc 08/28/2015  Navigator Encounter Type Introductory phone call/I received a referral on Todd Kelly today.  I called and scheduled him to be seen at thoracic clinic on 09/04/15 arrive at 1:30.  Wife verbalized understanding of appt time and place.   Patient Visit Type Initial  Treatment Phase Abnormal Scans  Interventions Coordination of Care  Coordination of Care MD Appointments  Time Spent with Patient 30

## 2015-09-03 ENCOUNTER — Telehealth: Payer: Self-pay | Admitting: *Deleted

## 2015-09-03 NOTE — Telephone Encounter (Signed)
Left message w/ friendly reminder about clinic tomorrow for the pt.

## 2015-09-04 ENCOUNTER — Encounter: Payer: Self-pay | Admitting: Internal Medicine

## 2015-09-04 ENCOUNTER — Other Ambulatory Visit (HOSPITAL_BASED_OUTPATIENT_CLINIC_OR_DEPARTMENT_OTHER): Payer: Commercial Managed Care - HMO

## 2015-09-04 ENCOUNTER — Ambulatory Visit
Admission: RE | Admit: 2015-09-04 | Discharge: 2015-09-04 | Disposition: A | Discharge status: Home / Self Care | Payer: Commercial Managed Care - HMO | Source: Ambulatory Visit | Attending: Radiation Oncology | Admitting: Radiation Oncology

## 2015-09-04 ENCOUNTER — Telehealth: Payer: Self-pay | Admitting: Internal Medicine

## 2015-09-04 ENCOUNTER — Ambulatory Visit: Payer: Commercial Managed Care - HMO | Attending: Internal Medicine | Admitting: Physical Therapy

## 2015-09-04 ENCOUNTER — Institutional Professional Consult (permissible substitution) (INDEPENDENT_AMBULATORY_CARE_PROVIDER_SITE_OTHER): Payer: Commercial Managed Care - HMO | Admitting: Cardiothoracic Surgery

## 2015-09-04 ENCOUNTER — Ambulatory Visit (HOSPITAL_BASED_OUTPATIENT_CLINIC_OR_DEPARTMENT_OTHER): Payer: Commercial Managed Care - HMO | Admitting: Internal Medicine

## 2015-09-04 VITALS — BP 116/53 | HR 68 | Temp 98.3°F | Resp 17 | Ht 68.0 in | Wt 190.4 lb

## 2015-09-04 VITALS — BP 116/53 | HR 68 | Temp 98.3°F | Resp 17 | Ht 69.0 in | Wt 190.4 lb

## 2015-09-04 DIAGNOSIS — R5381 Other malaise: Secondary | ICD-10-CM | POA: Diagnosis present

## 2015-09-04 DIAGNOSIS — M545 Low back pain: Secondary | ICD-10-CM | POA: Insufficient documentation

## 2015-09-04 DIAGNOSIS — G8929 Other chronic pain: Secondary | ICD-10-CM | POA: Insufficient documentation

## 2015-09-04 DIAGNOSIS — R918 Other nonspecific abnormal finding of lung field: Secondary | ICD-10-CM

## 2015-09-04 DIAGNOSIS — R293 Abnormal posture: Secondary | ICD-10-CM | POA: Diagnosis present

## 2015-09-04 DIAGNOSIS — R2689 Other abnormalities of gait and mobility: Secondary | ICD-10-CM | POA: Insufficient documentation

## 2015-09-04 LAB — CBC WITH DIFFERENTIAL/PLATELET
BASO%: 0.1 % (ref 0.0–2.0)
Basophils Absolute: 0 10*3/uL (ref 0.0–0.1)
EOS%: 0.1 % (ref 0.0–7.0)
Eosinophils Absolute: 0 10*3/uL (ref 0.0–0.5)
HEMATOCRIT: 39.7 % (ref 38.4–49.9)
HGB: 12.7 g/dL — ABNORMAL LOW (ref 13.0–17.1)
LYMPH#: 0.8 10*3/uL — AB (ref 0.9–3.3)
LYMPH%: 7.6 % — ABNORMAL LOW (ref 14.0–49.0)
MCH: 30.8 pg (ref 27.2–33.4)
MCHC: 32 g/dL (ref 32.0–36.0)
MCV: 96.4 fL (ref 79.3–98.0)
MONO#: 0.5 10*3/uL (ref 0.1–0.9)
MONO%: 4.5 % (ref 0.0–14.0)
NEUT%: 87.7 % — ABNORMAL HIGH (ref 39.0–75.0)
NEUTROS ABS: 9.4 10*3/uL — AB (ref 1.5–6.5)
PLATELETS: 153 10*3/uL (ref 140–400)
RBC: 4.12 10*6/uL — AB (ref 4.20–5.82)
RDW: 14.6 % (ref 11.0–14.6)
WBC: 10.7 10*3/uL — AB (ref 4.0–10.3)

## 2015-09-04 LAB — COMPREHENSIVE METABOLIC PANEL (CC13)
ANION GAP: 6 meq/L (ref 3–11)
AST: 13 U/L (ref 5–34)
Albumin: 2.9 g/dL — ABNORMAL LOW (ref 3.5–5.0)
Alkaline Phosphatase: 57 U/L (ref 40–150)
BILIRUBIN TOTAL: 0.6 mg/dL (ref 0.20–1.20)
BUN: 26.4 mg/dL — AB (ref 7.0–26.0)
CO2: 29 meq/L (ref 22–29)
Calcium: 8.5 mg/dL (ref 8.4–10.4)
Chloride: 109 mEq/L (ref 98–109)
Creatinine: 1.2 mg/dL (ref 0.7–1.3)
EGFR: 55 mL/min/{1.73_m2} — ABNORMAL LOW (ref >90)
Glucose: 117 mg/dl (ref 70–140)
Potassium: 4.4 mEq/L (ref 3.5–5.1)
Sodium: 144 mEq/L (ref 136–145)
TOTAL PROTEIN: 5.7 g/dL — AB (ref 6.4–8.3)

## 2015-09-04 NOTE — Telephone Encounter (Signed)
lvm for pt regarding to DEC appt.Marland KitchenMarland KitchenMarland Kitchen

## 2015-09-04 NOTE — Progress Notes (Signed)
[place] Telephone:([phone]) [phone]   Fax:([phone]) [phone] Multidisciplinary thoracic oncology clinic  CONSULT NOTE  REFERRING PHYSICIAN: Dr. Maurice Small  REASON FOR CONSULTATION:  81 years old white male with questionable lung cancer  HPI Todd Kelly is a 81 y.o. male with past medical history significant for COPD, hypertension, obstructive sleep apnea, congestive heart failure, coronary artery disease, chronic back pain as well as long history of smoking but quit 20 years ago. The patient mentioned that for the last 3 weeks he has been complaining of increasing shortness breath and cough productive of brownish sputum. He also has a weight loss of around 84 pounds in the last 3 years. He was seen by his primary care physician Dr. Justin Mend. She ordered chest x-ray which was performed on 08/26/2015 and it showed persistent left upper lobe changes. This was followed by CT scan of the chest with contrast on 08/27/2015 and it showed central left upper lobe lung mass, obstructing the left upper lobe bronchus. This measures 5.4 x 6.6 cm. The central left upper lobe mass invades the mediastinum and is intimately associated with the left pulmonary artery. An adjacent 9 mm AP window node is suspicious based on location. Left infrahilar node measures 12 mm. There was also a spiculated subpleural left upper lobe pulmonary nodule measuring 1.9 x 2.1 cm. There was a subtle irregularity involving the right hepatic capsule. Dr. Justin Mend kindly referred the patient to the multidisciplinary thoracic oncology clinic today for further evaluation and management of his condition. When seen today the patient continues to complain of generalized weakness and fatigue in addition to shortness breath at baseline and increased with exertion and cough productive of clear light brownish sputum. He also has mild headache but attributed it to sinus problem. He also has some blurry vision. He denied having any  significant nausea or vomiting. He lost around 84 pounds in the last 3 years, some of it was secondary to aggressive diuresis for his congestive heart failure. Family history significant for a mother with pancreatic cancer and father had heart disease. The patient is married and has no biologic children but stepchildren. He was accompanied today by his wife Enid Derry. He used to work in an Facilities manager. He has a history of smoking more than one pack per day for around 50 years but quit 20 years ago. He has no history of alcohol or drug abuse.    HPI  Past Medical History  Diagnosis Date  . OSA (obstructive sleep apnea)   . Bronchial pneumonia   . Bronchitis, chronic with acute exacerbation ([place])   . COPD (chronic obstructive pulmonary disease) ([place])   . Hypertension   . Arteriosclerotic heart disease   . Atrial fibrillation ([place])   . Cardiac pacemaker in situ 04/12/2008    [place]  . Cerebrovascular disease   . Peripheral vascular disease ([place])   . Hypercholesteremia   . Obesity   . Diverticulosis of colon   . Adenomatous polyp of colon 08/1994  . Hemorrhoids   . DJD (degenerative joint disease)   . Chronic low back pain   . Anxiety   . CHF (congestive heart failure) ([place])   . CHB (complete heart block) ([place])   . Carotid arterial disease ([place])   . CAD (coronary artery disease)   . S/P CABG (coronary artery bypass graft) 2005    Past Surgical History  Procedure Laterality Date  . Pta to right common femoral artery  2001  Dr. Lyndel Safe  . Carotid endarterectomy  2002    bilateral.  Dr. Kellie Simmering  . Coronary artery bypass graft  2005  . Pacemaker placement  July 2009    for CHB  Dr. Gwenlyn Found. [employer]  . Appendectomy  1948  . US echocardiography  11/06/2011    Mod. LVH w/mod. depressed systolic function,EF 41-96%,QIWLN LV hypokinesis,mildly dilated LA,mild aortic root dilatation  . Nm myoview ltd  09/04/2010    mild perfusion defect basal inferior,mid inferior, & apical  inferior regions. LV systolic function deteriorated since 2006  . Cardiac catheterization  02/02/2008    patent grafts    Family History  Problem Relation Age of Onset  . Asthma Paternal Uncle   . Heart disease Paternal Grandmother   . Heart disease Mother   . Heart attack Father   . Rheum arthritis Father   . Rheum arthritis Paternal Uncle   . Pancreatic cancer Mother   . Hyperlipidemia Mother     Social History Social History  Substance Use Topics  . Smoking status: Former Smoker -- 1.50 packs/day for 54 years    Types: Cigarettes    Quit date: 10/18/1996  . Smokeless tobacco: Former Systems developer    Types: Chew     Comment: only used chewing tobacco while working in the yard a couple of times  . Alcohol Use: No    Allergies  Allergen Reactions  . Penicillins Anaphylaxis and Hives    Has patient had a PCN reaction causing immediate rash, facial/tongue/throat swelling, SOB or lightheadedness with hypotension: Yes Has patient had a PCN reaction causing severe rash involving mucus membranes or skin necrosis: No Has patient had a PCN reaction that required hospitalization Yes Has patient had a PCN reaction occurring within the last 10 years: No If all of the above answers are "NO", then may proceed with Cephalosporin use.    Current Outpatient Prescriptions  Medication Sig Dispense Refill  . ALPRAZolam (XANAX) 0.5 MG tablet Take 1 tablet (0.5 mg total) by mouth 3 (three) times daily as needed for anxiety. Not to exceed 3 per day. (Patient taking differently: Take 0.5 mg by mouth 2 (two) times daily. Not to exceed 3 per day.) 270 tablet 1  . amiodarone (PACERONE) 200 MG tablet Take 1 tablet (200 mg total) by mouth daily. 90 tablet 3  . atorvastatin (LIPITOR) 20 MG tablet Take 1 tablet (20 mg total) by mouth daily. 90 tablet 3  . Coenzyme Q10 (COQ10) 100 MG CAPS Take 100 mg by mouth daily.     [name] Dextromethorphan-Guaifenesin (MUCINEX DM) 30-600 MG TB12 Take 1 tablet by mouth 2 (two)  times daily.    [name] docusate sodium (COLACE) 250 MG capsule Take 250 mg by mouth daily after supper.    . ferrous sulfate 325 (65 FE) MG tablet Take 325 mg by mouth daily after supper.     . fluticasone (FLONASE) 50 MCG/ACT nasal spray USE ONE SPRAY IN EACH NOSTRIL TWICE A DAY (Patient taking differently: USE ONE SPRAY IN EACH NOSTRIL TWICE A DAY AS NEEDED FOR CONGESTION) 52 g 2  . furosemide (LASIX) 40 MG tablet Take 1 tablet (40 mg total) by mouth daily. (Patient taking differently: Take 40 mg by mouth daily as needed (weight of 192 lb or more). ) 90 tablet 3  . HYDROcodone-acetaminophen (NORCO/VICODIN) 5-325 MG per tablet Take 1/2 to 1 tablet by mouth three times daily as needed for pain (Patient taking differently: Take 0.5-1 tablets by mouth 3 (three) times  daily as needed (pain). ) 90 tablet 0  . levofloxacin (LEVAQUIN) 750 MG tablet Take 1 tablet (750 mg total) by mouth daily. 3 tablet 0  . losartan (COZAAR) 100 MG tablet Take 1 tablet (100 mg total) by mouth at bedtime. (Patient taking differently: Take 100 mg by mouth daily with supper. ) 10 tablet 0  . meclizine (ANTIVERT) 25 MG tablet Take 1/2 to 1 tablet by mouth every 6 hours as needed for dizziness (Patient taking differently: Take 25 mg by mouth 2 (two) times daily as needed for dizziness. ) 90 tablet 3  . metoprolol tartrate (LOPRESSOR) 25 MG tablet Take 1 tablet (25 mg total) by mouth 2 (two) times daily. (Patient taking differently: Take 12.5-25 mg by mouth 2 (two) times daily. Take 1/2 tablet (12.5 mg) in the morning and 1 tablet (25 mg) in the evening.) 180 tablet 3  . montelukast (SINGULAIR) 10 MG tablet TAKE 1 TABLET AT BEDTIME (Patient taking differently: TAKE 1 TABLET BY MOUTH DAILY AFTER SUPPER) 90 tablet 0  . Omega-3 Fatty Acids (FISH OIL) 1000 MG CAPS Take 1,000 mg by mouth daily after supper.     Vladimir Faster Glycol-Propyl Glycol (SYSTANE OP) Place 1 drop into both eyes 4 (four) times daily.    . potassium chloride (K-DUR) 10  MEQ tablet Take 20 mEq by mouth daily as needed (if taking lasix).    . predniSONE (DELTASONE) 5 MG tablet Take 5 mg by mouth daily.     . ranitidine (ZANTAC) 150 MG tablet Take 1 tablet (150 mg total) by mouth 2 (two) times daily. (Patient taking differently: Take 150 mg by mouth daily as needed (acid reflux). ) 180 tablet 2  . rivaroxaban (XARELTO) 20 MG TABS tablet Take 1 tablet (20 mg total) by mouth daily with supper. 90 tablet 3  . SYMBICORT 160-4.5 MCG/ACT inhaler USE 2 INHALATIONS TWICE A DAY 30.6 g 1  . tiotropium (SPIRIVA HANDIHALER) 18 MCG inhalation capsule INHALE THE CONTENTS OF 1 CAPSULE DAILY (Patient taking differently: Place 18 mcg into inhaler and inhale daily after supper. INHALE THE CONTENTS OF 1 CAPSULE DAILY) 90 capsule 3  . vitamin B-12 (CYANOCOBALAMIN) 500 MCG tablet Take 500 mcg by mouth daily after supper.     . vitamin C (ASCORBIC ACID) 500 MG tablet Take 500 mg by mouth daily after supper.      No current facility-administered medications for this visit.   Facility-Administered Medications Ordered in Other Visits  Medication Dose Route Frequency Provider Last Rate Last Dose  . 0.9 %  sodium chloride infusion    Continuous PRN Durene Romans, CRNA        Review of Systems  Constitutional: positive for anorexia, fatigue and weight loss Eyes: negative Ears, nose, mouth, throat, and face: negative Respiratory: positive for cough, dyspnea on exertion and sputum Cardiovascular: negative Gastrointestinal: negative Genitourinary:negative Integument/breast: negative Hematologic/lymphatic: negative Musculoskeletal:positive for muscle weakness Neurological: negative Behavioral/Psych: negative Endocrine: negative Allergic/Immunologic: negative  Physical Exam  ACZ:YSAYT, healthy, no distress, well nourished and well developed SKIN: skin color, texture, turgor are normal, no rashes or significant lesions HEAD: Normocephalic, No masses, lesions, tenderness or  abnormalities EYES: normal, PERRLA, Conjunctiva are pink and non-injected EARS: External ears normal, Canals clear OROPHARYNX:no exudate, no erythema and lips, buccal mucosa, and tongue normal  NECK: supple, no adenopathy, no JVD LYMPH:  no palpable lymphadenopathy, no hepatosplenomegaly LUNGS: expiratory wheezes bilaterally, scattered rhonchi bilaterally HEART: regular rate & rhythm and no murmurs ABDOMEN:abdomen soft, non-tender, normal  bowel sounds and no masses or organomegaly BACK: Back symmetric, no curvature., No CVA tenderness EXTREMITIES:no joint deformities, effusion, or inflammation, no edema, no skin discoloration  NEURO: alert & oriented x 3 with fluent speech, no focal motor/sensory deficits  PERFORMANCE STATUS: ECOG 1-2  LABORATORY DATA: Lab Results  Component Value Date   WBC 10.7* 09/04/2015   HGB 12.7* 09/04/2015   HCT 39.7 09/04/2015   MCV 96.4 09/04/2015   PLT 153 09/04/2015      Chemistry      Component Value Date/Time   NA 144 09/04/2015 1339   NA 140 08/20/2015 0525   K 4.4 09/04/2015 1339   K 3.1* 08/20/2015 0525   CL 99* 08/20/2015 0525   CO2 29 09/04/2015 1339   CO2 27 08/20/2015 0525   BUN 26.4* 09/04/2015 1339   BUN 23* 08/20/2015 0525   CREATININE 1.2 09/04/2015 1339   CREATININE 1.05 08/20/2015 0525   CREATININE 1.19 09/14/2013 1652      Component Value Date/Time   CALCIUM 8.5 09/04/2015 1339   CALCIUM 8.7* 08/20/2015 0525   ALKPHOS 57 09/04/2015 1339   ALKPHOS 58 08/17/2015 0600   AST 13 09/04/2015 1339   AST 17 08/17/2015 0600   ALT <9 09/04/2015 1339   ALT 13* 08/17/2015 0600   BILITOT 0.60 09/04/2015 1339   BILITOT 1.1 08/17/2015 0600       RADIOGRAPHIC STUDIES: Dg Chest 2 View  08/26/2015  CLINICAL DATA:  Followup pneumonia EXAM: CHEST - 2 VIEW COMPARISON:  08/19/2015 FINDINGS: Cardiac shadow remains enlarged. A pacing device is again seen. The lungs are again well aerated. There is a persistent left suprahilar density  extending into the left upper lobe. Given the persistent nature the possibility of a central mass with peripheral consolidation deserves consideration. No new focal infiltrate is seen. No bony abnormality is noted. IMPRESSION: Persistent left upper lobe changes. CT with contrast is recommended for further evaluation to rule out an underlying mass lesion causing the upper lobe consolidation. Electronically Signed   By: Inez Catalina M.D.   On: 08/26/2015 12:32   Dg Chest 2 View  08/16/2015  CLINICAL DATA:  Increased shortness breath for 1 week. History of COPD and CHF. EXAM: CHEST  2 VIEW COMPARISON:  Chest x-rays dated 11/08/2013 and 08/09/2013. FINDINGS: Mild cardiomegaly is unchanged. There is central pulmonary vascular congestion which appears similar to previous exams. There is now probable mild bilateral interstitial edema. Left chest wall pacemaker/AICD is unchanged in position. Median sternotomy wires appear intact and stable in alignment. New confluent airspace opacity is seen within the inferior segment of the left upper lobe extending towards the left hilum. Additional ill-defined opacity at the right lung base. IMPRESSION: 1. Cardiomegaly with central pulmonary vascular congestion and mild bilateral interstitial edema suggesting mild CHF/volume overload. 2. New confluent airspace opacity within the inferior segment of the left upper lobe, extending towards the left hilum, which could represent pneumonia or asymmetric pulmonary edema. Favor pneumonia. 3. Additional opacity at the right lung base, more likely atelectasis and/or small effusion, but an additional basilar pneumonia cannot be excluded. These results were called by telephone at the time of interpretation on 08/16/2015 at 6:14 pm to Dr. Janne Napoleon , who verbally acknowledged these results. Electronically Signed   By: Franki Cabot M.D.   On: 08/16/2015 18:08   Dg Ribs Bilateral W/chest  08/19/2015  CLINICAL DATA:  81 year old with injury to  the anterior lower left ribs 2 days ago. Initial encounter. Personal  history of bilateral rib fractures. EXAM: BILATERAL RIBS AND CHEST - 4+ VIEW 1819 hr: COMPARISON:  Chest x-rays earlier same date 1139 hr and previously. No prior rib imaging. FINDINGS: Site of maximum pain and tenderness was marked with a metallic BB. No acute rib fracture identified. Old healed fractures involving the left lateral 8th rib and the right lateral 5th, 6th, 7th and 8th ribs and the right anterior 9th rib. Prior sternotomy for CABG. Cardiac silhouette markedly enlarged, unchanged. Further improvement in aeration in the right lower lobe since earlier today, with mild atelectasis persisting. Streaky and patchy airspace opacity in the left upper lobe, unchanged. No new pulmonary parenchymal abnormalities. Mild pulmonary venous hypertension without overt edema. Left subclavian dual lead transvenous pacemaker unchanged and intact. IMPRESSION: 1. No acute rib fractures identified. 2. Old healed bilateral rib fractures as detailed above. 3. Further improvement in aeration in the right lung base with mild atelectasis persisting. 4. Stable left upper lobe atelectasis and/or bronchopneumonia. 5. No new abnormalities. Electronically Signed   By: Evangeline Dakin M.D.   On: 08/19/2015 18:38   Ct Chest W Contrast  08/27/2015  CLINICAL DATA:  Recent pneumonia. Cough. Short of breath and weight loss. Ex-smoker. EXAM: CT CHEST WITH CONTRAST TECHNIQUE: Multidetector CT imaging of the chest was performed during intravenous contrast administration. CONTRAST:  37m ISOVUE-300 IOPAMIDOL (ISOVUE-300) INJECTION 61% COMPARISON:  Chest radiographs, most recent 1 day prior. No prior CT. FINDINGS: Mediastinum/Nodes: Aortic and branch vessel atherosclerosis. Ulcerative plaque throughout the descending thoracic aorta. Mild cardiomegaly with pacer in place. No pericardial effusion. Pulmonary artery enlargement, outflow tract 3.3 cm. The central left upper lobe  mass invades the mediastinum and is intimately associated with the left pulmonary artery. An adjacent 9 mm AP window node is suspicious based on location. Left infrahilar node measures 12 mm (image 26,, series 3), suspicious. Lungs/Pleura: Small right greater than left pleural effusions. Minimal patchy right upper lobe opacities are favored to represent areas of subsegmental atelectasis. Spiculated subpleural left upper lobe pulmonary nodule measures 1.9 x 2.1 cm (image 21, series 4). Central left upper lobe lung mass, obstructing the left upper lobe bronchus. This measures 5.4 x 6.6 cm (image 23, series 4). Upper abdomen: Apparent subtle irregularity involving the right lobe of the liver at approximately 9 mm (image 63, series 3). Normal imaged portions of the spleen, stomach, pancreas, right adrenal gland. Mild left adrenal thickening and nodularity. On the order of 1.6 cm. An upper pole left renal lesion is most consistent with a cyst or minimally complex cyst at approximately 9 mm. Abdominal aortic atherosclerosis. Musculoskeletal: Probable sebaceous cyst superficial to the spinous process at 1.6 cm (image 15, series 3). lateral right rib remote trauma. IMPRESSION: 1. Central left upper lobe lung mass, consistent with primary bronchogenic carcinoma. This has direct mediastinal invasion with left mediastinal and infrahilar adenopathy, suspicious for nodal metastasis. 2. A more peripheral left upper lobe spiculated nodule is suspicious for a synchronous primary bronchogenic carcinoma. 3. Small bilateral pleural effusions. 4. Subtle irregularity involving the right hepatic capsule. Indeterminate. 5. Left adrenal nodularity, also indeterminate. Consider PET with attention to these 2 areas. 6. Pulmonary artery enlargement suggests pulmonary arterial hypertension. These results will be called to the ordering clinician or representative by the Radiologist Assistant, and communication documented in the PACS or z[employer]ision  Dashboard. Electronically Signed   By: KAbigail MiyamotoM.D.   On: 08/27/2015 09:30   Dg Chest Port 1 View  08/19/2015  CLINICAL DATA:  81year old male  with left chest pain. Rib fractures. EXAM: PORTABLE CHEST 1 VIEW COMPARISON:  08/16/2015 and prior chest radiographs FINDINGS: Cardiomegaly, CABG changes and left-sided pacemaker again noted. Focal left upper lobe opacity/atelectasis/ airspace disease is unchanged. Improved right basilar aeration noted with mild right basilar atelectasis present. There is no evidence of pneumothorax. There is a trace right pleural effusion present. Fractures of the right fifth through eighth ribs are noted. IMPRESSION: Improved right basilar aeration without other significant change. Continued left upper lobe opacity which may represent atelectasis or airspace disease. Fractures of the right fifth through eighth ribs. Trace right pleural effusion. Electronically Signed   By: Margarette Canada M.D.   On: 08/19/2015 13:28    ASSESSMENT: This is a very pleasant 81 years old white male with questionably stage III/IV lung cancer highly suspicious for non-small cell carcinoma, presented with central left upper lobe mass with direct invasion of the mediastinum as well as mediastinal and hilar lymphadenopathy as well as questionable right pleural effusion and small liver lesion. The patient has not tissue diagnosis yet.   PLAN: I had a lengthy discussion with the patient and his family today about his current disease status and further investigation to confirm his diagnosis. I recommended for the patient to complete the staging workup for his disease by ordering a PET scan as well as CT scan of the head to rule out brain metastases. The patient has a pacemaker and he would not be a good candidate for MRI of the brain. We will also arrange for the patient to see Dr. Servando Snare later today for evaluation and consideration of bronchoscopy plus/minus endobronchial ultrasound and lymph node  biopsies. If the tissue diagnosis is consistent with adenocarcinoma, we will consider sending the tissue for molecular biomarker studies. We will also consider sending the tissue for PDL 1 expression. The patient come back for follow-up visit in 2 weeks for reevaluation and more detailed discussion of his treatment options based on the final staging workup and biopsy results. He was advised to call immediately if he has any concerning symptoms in the interval. The patient was seen during the multidisciplinary thoracic oncology clinic today by medical oncology, radiation oncology, thoracic surgery and physical therapist. The patient voices understanding of current disease status and treatment options and is in agreement with the current care plan.  All questions were answered. The patient knows to call the clinic with any problems, questions or concerns. We can certainly see the patient much sooner if necessary.  Thank you so much for allowing me to participate in the care of [name]. I will continue to follow up the patient with you and assist in his care.  I spent 40 minutes counseling the patient face to face. The total time spent in the appointment was 60 minutes.  Disclaimer: This note was dictated with voice recognition software. Similar sounding words can inadvertently be transcribed and may not be corrected upon review.   Meredith Kilbride K. September 04, 2015, 2:46 PM

## 2015-09-04 NOTE — Progress Notes (Signed)
Radiation Oncology         [phone] ________________________________  Multidisciplinary Thoracic Oncology Clinic [facility]) Initial Outpatient Consultation  Name: Todd Kelly MRN: [id number]  Date: 09/04/2015  DOB: [birth year]  WU:XLKG, Valla Leaver, MD  Maurice Small, MD   REFERRING PHYSICIAN: Maurice Small, MD  DIAGNOSIS:  81 yo gentleman with left hilar lung mass pending biopsy and staging    ICD-9-CM ICD-10-CM   1. Lung mass 786.6 R91.8     HISTORY OF PRESENT ILLNESS::Todd Kelly is a 81 y.o. gentleman who was diagnosed with pneumonia on 08/16/15. Despite treatment, there were persistent left upper lobe lung changes. Consequently, chest CT on November 9th revealed the presence of a central left upper lung mass with direct mediastinal invasion as well as adenopathy in the left hilum and mediastinum. In addition, there was a spiculated left upper lobe nodule. In addition, there was some left adrenal nodularity as well as right liver abnormality, both prompting the recommendation for PET/CT.   The patient was referred today for presentation in the multidisciplinary conference.  Radiology studies and pathology slides were presented there for review and discussion of treatment options.  A consensus was discussed regarding potential next steps.  PREVIOUS RADIATION THERAPY: No  PAST MEDICAL HISTORY:  has a past medical history of OSA (obstructive sleep apnea); Bronchial pneumonia; Bronchitis, chronic with acute exacerbation ([place]); COPD (chronic obstructive pulmonary disease) ([place]); Hypertension; Arteriosclerotic heart disease; Atrial fibrillation ([place]); Cardiac pacemaker in situ (04/12/2008); Cerebrovascular disease; Peripheral vascular disease ([place]); Hypercholesteremia; Obesity; Diverticulosis of colon; Adenomatous polyp of colon (08/1994); Hemorrhoids; DJD (degenerative joint disease); Chronic low back pain; Anxiety; CHF (congestive heart failure) ([place]); CHB (complete heart block) ([place]);  Carotid arterial disease ([facility]); CAD (coronary artery disease); and S/P CABG (coronary artery bypass graft) (2005).    PAST SURGICAL HISTORY: Past Surgical History  Procedure Laterality Date  . Pta to right common femoral artery  2001    Dr. Lyndel Safe  . Carotid endarterectomy  2002    bilateral.  Dr. Kellie Simmering  . Coronary artery bypass graft  2005  . Pacemaker placement  July 2009    for CHB  Dr. Gwenlyn Found. [employer]  . Appendectomy  1948  . US echocardiography  11/06/2011    Mod. LVH w/mod. depressed systolic function,EF 40-10%,UVOZD LV hypokinesis,mildly dilated LA,mild aortic root dilatation  . Nm myoview ltd  09/04/2010    mild perfusion defect basal inferior,mid inferior, & apical inferior regions. LV systolic function deteriorated since 2006  . Cardiac catheterization  02/02/2008    patent grafts    FAMILY HISTORY: family history includes Asthma in his paternal uncle; Heart attack in his father; Heart disease in his mother and paternal grandmother; Hyperlipidemia in his mother; Pancreatic cancer in his mother; Rheum arthritis in his father and paternal uncle.  SOCIAL HISTORY:  reports that he quit smoking about 18 years ago. His smoking use included Cigarettes. He has a 81 pack-year smoking history. He has quit using smokeless tobacco. His smokeless tobacco use included Chew. He reports that he does not drink alcohol or use illicit drugs.  ALLERGIES: Penicillins  MEDICATIONS:  Current Outpatient Prescriptions  Medication Sig Dispense Refill  . ALPRAZolam (XANAX) 0.5 MG tablet Take 1 tablet (0.5 mg total) by mouth 3 (three) times daily as needed for anxiety. Not to exceed 3 per day. (Patient taking differently: Take 0.5 mg by mouth 2 (two) times daily. Not to exceed 3 per day.) 270 tablet 1  . amiodarone (PACERONE) 200 MG  tablet Take 1 tablet (200 mg total) by mouth daily. 90 tablet 3  . atorvastatin (LIPITOR) 20 MG tablet Take 1 tablet (20 mg total) by mouth daily. 90 tablet 3  . Coenzyme  Q10 (COQ10) 100 MG CAPS Take 100 mg by mouth daily.     [name] Dextromethorphan-Guaifenesin (MUCINEX DM) 30-600 MG TB12 Take 1 tablet by mouth 2 (two) times daily.    [name] docusate sodium (COLACE) 250 MG capsule Take 250 mg by mouth daily after supper.    . ferrous sulfate 325 (65 FE) MG tablet Take 325 mg by mouth daily after supper.     . fluticasone (FLONASE) 50 MCG/ACT nasal spray USE ONE SPRAY IN EACH NOSTRIL TWICE A DAY (Patient taking differently: USE ONE SPRAY IN EACH NOSTRIL TWICE A DAY AS NEEDED FOR CONGESTION) 52 g 2  . furosemide (LASIX) 40 MG tablet Take 1 tablet (40 mg total) by mouth daily. (Patient taking differently: Take 40 mg by mouth daily as needed (weight of 192 lb or more). ) 90 tablet 3  . HYDROcodone-acetaminophen (NORCO/VICODIN) 5-325 MG per tablet Take 1/2 to 1 tablet by mouth three times daily as needed for pain (Patient taking differently: Take 0.5-1 tablets by mouth 3 (three) times daily as needed (pain). ) 90 tablet 0  . levofloxacin (LEVAQUIN) 750 MG tablet Take 1 tablet (750 mg total) by mouth daily. 3 tablet 0  . losartan (COZAAR) 100 MG tablet Take 1 tablet (100 mg total) by mouth at bedtime. (Patient taking differently: Take 100 mg by mouth daily with supper. ) 10 tablet 0  . meclizine (ANTIVERT) 25 MG tablet Take 1/2 to 1 tablet by mouth every 6 hours as needed for dizziness (Patient taking differently: Take 25 mg by mouth 2 (two) times daily as needed for dizziness. ) 90 tablet 3  . metoprolol tartrate (LOPRESSOR) 25 MG tablet Take 1 tablet (25 mg total) by mouth 2 (two) times daily. (Patient taking differently: Take 12.5-25 mg by mouth 2 (two) times daily. Take 1/2 tablet (12.5 mg) in the morning and 1 tablet (25 mg) in the evening.) 180 tablet 3  . montelukast (SINGULAIR) 10 MG tablet TAKE 1 TABLET AT BEDTIME (Patient taking differently: TAKE 1 TABLET BY MOUTH DAILY AFTER SUPPER) 90 tablet 0  . Omega-3 Fatty Acids (FISH OIL) 1000 MG CAPS Take 1,000 mg by mouth daily after  supper.     Vladimir Faster Glycol-Propyl Glycol (SYSTANE OP) Place 1 drop into both eyes 4 (four) times daily.    . potassium chloride (K-DUR) 10 MEQ tablet Take 20 mEq by mouth daily as needed (if taking lasix).    . predniSONE (DELTASONE) 5 MG tablet Take 5 mg by mouth daily.     . ranitidine (ZANTAC) 150 MG tablet Take 1 tablet (150 mg total) by mouth 2 (two) times daily. (Patient taking differently: Take 150 mg by mouth daily as needed (acid reflux). ) 180 tablet 2  . rivaroxaban (XARELTO) 20 MG TABS tablet Take 1 tablet (20 mg total) by mouth daily with supper. 90 tablet 3  . SYMBICORT 160-4.5 MCG/ACT inhaler USE 2 INHALATIONS TWICE A DAY 30.6 g 1  . tiotropium (SPIRIVA HANDIHALER) 18 MCG inhalation capsule INHALE THE CONTENTS OF 1 CAPSULE DAILY (Patient taking differently: Place 18 mcg into inhaler and inhale daily after supper. INHALE THE CONTENTS OF 1 CAPSULE DAILY) 90 capsule 3  . vitamin B-12 (CYANOCOBALAMIN) 500 MCG tablet Take 500 mcg by mouth daily after supper.     [name]  vitamin C (ASCORBIC ACID) 500 MG tablet Take 500 mg by mouth daily after supper.      No current facility-administered medications for this encounter.   Facility-Administered Medications Ordered in Other Encounters  Medication Dose Route Frequency Provider Last Rate Last Dose  . 0.9 %  sodium chloride infusion    Continuous PRN Durene Romans, CRNA        REVIEW OF SYSTEMS:  A 15 point review of systems is documented in the electronic medical record. This was obtained by the nursing staff. However, I reviewed this with the patient to discuss relevant findings and make appropriate changes.  Pertinent items are noted in HPI.   The patient is accompanied by her wife and daughter. Their daughter recently died of esophageal cancer. Their son-in-law had colorectal cancer and received radiation "that burned right through him". His wife is nervous about radiation treatment. He has a pacemaker. Wife states he was diagnosed  with COPD five years ago. Wife notes that her husband had trouble breathing after his recent flu shot. He never had issues with receiving a flu shot previously.    PHYSICAL EXAM:  Vitals with BMI 09/04/2015  Height '5\' 8"'$   Weight 190 lbs 6 oz  BMI 29  Systolic 086  Diastolic 53  Pulse 68  Respirations 17   per med-onc VHQ:IONGE, healthy, no distress, well nourished and well developed SKIN: skin color, texture, turgor are normal, no rashes or significant lesions HEAD: Normocephalic, No masses, lesions, tenderness or abnormalities EYES: normal, PERRLA, Conjunctiva are pink and non-injected EARS: External ears normal, Canals clear OROPHARYNX:no exudate, no erythema and lips, buccal mucosa, and tongue normal  NECK: supple, no adenopathy, no JVD LYMPH: no palpable lymphadenopathy, no hepatosplenomegaly LUNGS: expiratory wheezes bilaterally, scattered rhonchi bilaterally HEART: regular rate & rhythm and no murmurs ABDOMEN:abdomen soft, non-tender, normal bowel sounds and no masses or organomegaly BACK: Back symmetric, no curvature., No CVA tenderness EXTREMITIES:no joint deformities, effusion, or inflammation, no edema, no skin discoloration  NEURO: alert & oriented x 3 with fluent speech, no focal motor/sensory deficits  KPS = 80  100 - Normal; no complaints; no evidence of disease. 90   - Able to carry on normal activity; minor signs or symptoms of disease. 80   - Normal activity with effort; some signs or symptoms of disease. 49   - Cares for self; unable to carry on normal activity or to do active work. 60   - Requires occasional assistance, but is able to care for most of his personal needs. 50   - Requires considerable assistance and frequent medical care. 42   - Disabled; requires special care and assistance. 35   - Severely disabled; hospital admission is indicated although death not imminent. 50   - Very sick; hospital admission necessary; active supportive treatment  necessary. 10   - Moribund; fatal processes progressing rapidly. 0     - Dead  Karnofsky DA, Abelmann [place], Craver LS and Burchenal [facility] [phone]) The use of the nitrogen mustards in the palliative treatment of carcinoma: with particular reference to bronchogenic carcinoma Cancer 1 634-56  LABORATORY DATA:  Lab Results  Component Value Date   WBC 10.7* 09/04/2015   HGB 12.7* 09/04/2015   HCT 39.7 09/04/2015   MCV 96.4 09/04/2015   PLT 153 09/04/2015   Lab Results  Component Value Date   NA 144 09/04/2015   K 4.4 09/04/2015   CL 99* 08/20/2015   CO2 29 09/04/2015   Lab Results  Component Value Date   ALT <9 09/04/2015   AST 13 09/04/2015   ALKPHOS 57 09/04/2015   BILITOT 0.60 09/04/2015    PULMONARY FUNCTION TEST:  N/A   RADIOGRAPHY: Dg Chest 2 View  08/26/2015  CLINICAL DATA:  Followup pneumonia EXAM: CHEST - 2 VIEW COMPARISON:  08/19/2015 FINDINGS: Cardiac shadow remains enlarged. A pacing device is again seen. The lungs are again well aerated. There is a persistent left suprahilar density extending into the left upper lobe. Given the persistent nature the possibility of a central mass with peripheral consolidation deserves consideration. No new focal infiltrate is seen. No bony abnormality is noted. IMPRESSION: Persistent left upper lobe changes. CT with contrast is recommended for further evaluation to rule out an underlying mass lesion causing the upper lobe consolidation. Electronically Signed   By: Inez Catalina M.D.   On: 08/26/2015 12:32   Dg Chest 2 View  08/16/2015  CLINICAL DATA:  Increased shortness breath for 1 week. History of COPD and CHF. EXAM: CHEST  2 VIEW COMPARISON:  Chest x-rays dated 11/08/2013 and 08/09/2013. FINDINGS: Mild cardiomegaly is unchanged. There is central pulmonary vascular congestion which appears similar to previous exams. There is now probable mild bilateral interstitial edema. Left chest wall pacemaker/AICD is unchanged in position. Median  sternotomy wires appear intact and stable in alignment. New confluent airspace opacity is seen within the inferior segment of the left upper lobe extending towards the left hilum. Additional ill-defined opacity at the right lung base. IMPRESSION: 1. Cardiomegaly with central pulmonary vascular congestion and mild bilateral interstitial edema suggesting mild CHF/volume overload. 2. New confluent airspace opacity within the inferior segment of the left upper lobe, extending towards the left hilum, which could represent pneumonia or asymmetric pulmonary edema. Favor pneumonia. 3. Additional opacity at the right lung base, more likely atelectasis and/or small effusion, but an additional basilar pneumonia cannot be excluded. These results were called by telephone at the time of interpretation on 08/16/2015 at 6:14 pm to Dr. Janne Napoleon , who verbally acknowledged these results. Electronically Signed   By: Franki Cabot M.D.   On: 08/16/2015 18:08   Dg Ribs Bilateral W/chest  08/19/2015  CLINICAL DATA:  81 year old with injury to the anterior lower left ribs 2 days ago. Initial encounter. Personal history of bilateral rib fractures. EXAM: BILATERAL RIBS AND CHEST - 4+ VIEW 1819 hr: COMPARISON:  Chest x-rays earlier same date 1139 hr and previously. No prior rib imaging. FINDINGS: Site of maximum pain and tenderness was marked with a metallic BB. No acute rib fracture identified. Old healed fractures involving the left lateral 8th rib and the right lateral 5th, 6th, 7th and 8th ribs and the right anterior 9th rib. Prior sternotomy for CABG. Cardiac silhouette markedly enlarged, unchanged. Further improvement in aeration in the right lower lobe since earlier today, with mild atelectasis persisting. Streaky and patchy airspace opacity in the left upper lobe, unchanged. No new pulmonary parenchymal abnormalities. Mild pulmonary venous hypertension without overt edema. Left subclavian dual lead transvenous pacemaker  unchanged and intact. IMPRESSION: 1. No acute rib fractures identified. 2. Old healed bilateral rib fractures as detailed above. 3. Further improvement in aeration in the right lung base with mild atelectasis persisting. 4. Stable left upper lobe atelectasis and/or bronchopneumonia. 5. No new abnormalities. Electronically Signed   By: Evangeline Dakin M.D.   On: 08/19/2015 18:38   Ct Chest W Contrast  08/27/2015  CLINICAL DATA:  Recent pneumonia. Cough. Short of breath and weight loss.  Ex-smoker. EXAM: CT CHEST WITH CONTRAST TECHNIQUE: Multidetector CT imaging of the chest was performed during intravenous contrast administration. CONTRAST:  45m ISOVUE-300 IOPAMIDOL (ISOVUE-300) INJECTION 61% COMPARISON:  Chest radiographs, most recent 1 day prior. No prior CT. FINDINGS: Mediastinum/Nodes: Aortic and branch vessel atherosclerosis. Ulcerative plaque throughout the descending thoracic aorta. Mild cardiomegaly with pacer in place. No pericardial effusion. Pulmonary artery enlargement, outflow tract 3.3 cm. The central left upper lobe mass invades the mediastinum and is intimately associated with the left pulmonary artery. An adjacent 9 mm AP window node is suspicious based on location. Left infrahilar node measures 12 mm (image 26,, series 3), suspicious. Lungs/Pleura: Small right greater than left pleural effusions. Minimal patchy right upper lobe opacities are favored to represent areas of subsegmental atelectasis. Spiculated subpleural left upper lobe pulmonary nodule measures 1.9 x 2.1 cm (image 21, series 4). Central left upper lobe lung mass, obstructing the left upper lobe bronchus. This measures 5.4 x 6.6 cm (image 23, series 4). Upper abdomen: Apparent subtle irregularity involving the right lobe of the liver at approximately 9 mm (image 63, series 3). Normal imaged portions of the spleen, stomach, pancreas, right adrenal gland. Mild left adrenal thickening and nodularity. On the order of 1.6 cm. An upper  pole left renal lesion is most consistent with a cyst or minimally complex cyst at approximately 9 mm. Abdominal aortic atherosclerosis. Musculoskeletal: Probable sebaceous cyst superficial to the spinous process at 1.6 cm (image 15, series 3). lateral right rib remote trauma. IMPRESSION: 1. Central left upper lobe lung mass, consistent with primary bronchogenic carcinoma. This has direct mediastinal invasion with left mediastinal and infrahilar adenopathy, suspicious for nodal metastasis. 2. A more peripheral left upper lobe spiculated nodule is suspicious for a synchronous primary bronchogenic carcinoma. 3. Small bilateral pleural effusions. 4. Subtle irregularity involving the right hepatic capsule. Indeterminate. 5. Left adrenal nodularity, also indeterminate. Consider PET with attention to these 2 areas. 6. Pulmonary artery enlargement suggests pulmonary arterial hypertension. These results will be called to the ordering clinician or representative by the Radiologist Assistant, and communication documented in the PACS or zVision Dashboard. Electronically Signed   By: KAbigail MiyamotoM.D.   On: 08/27/2015 09:30   Dg Chest Port 1 View  08/19/2015  CLINICAL DATA:  81year old male with left chest pain. Rib fractures. EXAM: PORTABLE CHEST 1 VIEW COMPARISON:  08/16/2015 and prior chest radiographs FINDINGS: Cardiomegaly, CABG changes and left-sided pacemaker again noted. Focal left upper lobe opacity/atelectasis/ airspace disease is unchanged. Improved right basilar aeration noted with mild right basilar atelectasis present. There is no evidence of pneumothorax. There is a trace right pleural effusion present. Fractures of the right fifth through eighth ribs are noted. IMPRESSION: Improved right basilar aeration without other significant change. Continued left upper lobe opacity which may represent atelectasis or airspace disease. Fractures of the right fifth through eighth ribs. Trace right pleural effusion.  Electronically Signed   By: JMargarette CanadaM.D.   On: 08/19/2015 13:28      IMPRESSION: Pleasant 81y.o. gentleman with left hilar lung mass pending biopsy and staging. We discussed the potential biopsy methods and the necessity for a PET scan for further staging.    PLAN: Today, I talked to the patient and family about the findings and work-up thus far.  We discussed the natural history of locally advanced lung cancer and general treatment, highlighting the role of radiotherapy in the management.  We discussed the available radiation techniques, and focused on the details of  logistics and delivery.  We reviewed the anticipated acute and late sequelae associated with radiation in this setting.  The patient was encouraged to ask questions that I answered to the best of my ability.  I spent 30 minutes minutes face to face with the patient and more than 50% of that time was spent in counseling and/or coordination of care.   ------------------------------------------------  Sheral Apley. Tammi Klippel, M.D.    This document serves as a record of services personally performed by Tyler Pita, MD. It was created on his behalf by Arlyce Harman, a trained medical scribe. The creation of this record is based on the scribe's personal observations and the provider's statements to them. This document has been checked and approved by the attending provider.

## 2015-09-04 NOTE — Therapy (Signed)
[place], [place], [address] Phone: [phone]   Fax:  [phone]  Physical Therapy Evaluation  Patient Details  Name: Todd Kelly MRN: [id number] Date of Birth: Jun 08, 1931 Referring Provider: Dr. Curt Bears  Encounter Date: 09/04/2015      PT End of Session - 09/04/15 1619    Visit Number 1   Number of Visits 1   PT Start Time 1528   PT Stop Time 1558   PT Time Calculation (min) 30 min   Activity Tolerance Patient tolerated treatment well;Patient limited by pain   Behavior During Therapy [facility] for tasks assessed/performed      Past Medical History  Diagnosis Date  . OSA (obstructive sleep apnea)   . Bronchial pneumonia   . Bronchitis, chronic with acute exacerbation ([place])   . COPD (chronic obstructive pulmonary disease) ([place])   . Hypertension   . Arteriosclerotic heart disease   . Atrial fibrillation ([place])   . Cardiac pacemaker in situ 04/12/2008    [place]  . Cerebrovascular disease   . Peripheral vascular disease ([place])   . Hypercholesteremia   . Obesity   . Diverticulosis of colon   . Adenomatous polyp of colon 08/1994  . Hemorrhoids   . DJD (degenerative joint disease)   . Chronic low back pain   . Anxiety   . CHF (congestive heart failure) ([place])   . CHB (complete heart block) ([place])   . Carotid arterial disease ([place])   . CAD (coronary artery disease)   . S/P CABG (coronary artery bypass graft) 2005    Past Surgical History  Procedure Laterality Date  . Pta to right common femoral artery  2001    Dr. Lyndel Safe  . Carotid endarterectomy  2002    bilateral.  Dr. Kellie Simmering  . Coronary artery bypass graft  2005  . Pacemaker placement  July 2009    for CHB  Dr. Gwenlyn Found. [employer]  . Appendectomy  1948  . US echocardiography  11/06/2011    Mod. LVH w/mod. depressed systolic function,EF 26-94%,WNIOE LV hypokinesis,mildly dilated LA,mild aortic root dilatation  . Nm  myoview ltd  09/04/2010    mild perfusion defect basal inferior,mid inferior, & apical inferior regions. LV systolic function deteriorated since 2006  . Cardiac catheterization  02/02/2008    patent grafts    There were no vitals filed for this visit.  Visit Diagnosis:  Physical deconditioning - Plan: PT plan of care cert/re-cert  Posture abnormality - Plan: PT plan of care cert/re-cert  Chronic low back pain - Plan: PT plan of care cert/re-cert  Unstable balance - Plan: PT plan of care cert/re-cert      Subjective Assessment - 09/04/15 1605    Subjective reports chronic low back pain   Patient is accompained by: Family member  wife, niece   Pertinent History Patient presented with cough, SOB, weight loss.  Workup showed a central left lung mass; scans also show left mediastinal invasion and adenopathy with suspicious adrenal and hepatic areas.  Patient was hospitalized recently for pneumonia (10/29-11/3/16).  Ex-smoker who quit 1998 (81 pack-years); CHF, CAD-CABG 2005; pacemaker, atrial fibrillation, carotid endarterectomy, COPD, HTN, DJD, chronic LBP.  Bilateral knee problems for which he gets injections.    Patient Stated Goals get information from all lung clinic providers   Currently in Pain? No/denies   Pain Score 0-No pain  but up to 8-10/10   Pain Location Back   Pain Orientation Lower  Pain Type Chronic pain   Pain Frequency Intermittent  worse standing without support, better sitting or lying down or with taking vicodin            [facility] PT Assessment - 09/04/15 0001    Assessment   Medical Diagnosis central left lung mass without pathology yet, but stage IIIB or IV   Referring Provider Dr. Curt Bears   Precautions   Precautions Fall;Other (comment)  cancer precautions   Restrictions   Weight Bearing Restrictions No   Balance Screen   Has the patient fallen in the past 6 months Yes   How many times? 1   Has the patient had a decrease in activity level  because of a fear of falling?  No   Is the patient reluctant to leave their home because of a fear of falling?  No   Home Environment   Living Environment Private residence   Living Arrangements Spouse/significant other  niece nearby   Type of [place] to enter   [place] --  yes   Home Layout One level   Prior Function   Level of Independence Needs assistance with ADLs;Needs assistance with homemaking;Requires assistive device for independence   Leisure no regular exercise, but walks around the house frequently, with frequent trips to the bathroom   Cognition   Overall Cognitive Status Within Functional Limits for tasks assessed   Observation/Other Assessments   Observations elderly man sitting in a wheelchair; has gauze bandage around left hand and wrist, and has discoloration at distal arms (hemosiderin type)   Functional Tests   Functional tests Sit to Stand   Sit to Stand   Comments unable to stand without using UE support   Posture/Postural Control   Posture/Postural Control Postural limitations   Postural Limitations Flexed trunk   ROM / Strength   AROM / PROM / Strength AROM   AROM   Overall AROM Comments trunk AROM considerably limited in standing:  flexion not tested due to balance impairments, extension to neutral spine only, then pain; sidebend limited 25% bilat. and painful; rotation 50% limited bilat. and painful.   Ambulation/Gait   Ambulation/Gait Yes   Ambulation/Gait Assistance 5: Supervision   Assistive device Rolling walker   Gait Pattern Antalgic   Gait Comments Patient is able to walk a few steps without assistive device but with supervision   Balance   Balance Assessed Yes   Dynamic Standing Balance   Dynamic Standing - Comments reaches 5 inches forward in standing, below average for age                           PT Education - 09/04/15 1618    Education provided Yes   Education Details energy  conservation, walking or other exercise (pedaling from chair), posture, breathing, Cure article on staying active, PT info   Person(s) Educated Patient;Spouse;Other (comment)  niece   Methods Explanation;Handout   Comprehension Verbalized understanding               Lung Clinic Goals - 09/04/15 1624    Patient will be able to verbalize understanding of the benefit of exercise to decrease fatigue.   Status Achieved   Patient will be able to verbalize the importance of posture.   Status Achieved   Patient will be able to demonstrate diaphragmatic breathing for improved lung function.   Status Achieved   Patient will be able to  verbalize understanding of the role of physical therapy to prevent functional decline and who to contact if physical therapy is needed.   Status Achieved             Plan - 09/04/15 1620    Clinical Impression Statement Patient with h/o low back pain, posture impairment, gait impairment, limited flexibility and impaired functional mobility now diagnosed with lung cancer and expected to have bronchoscopy for tissue diagnosis, then chemoradiation treatment.   Pt will benefit from skilled therapeutic intervention in order to improve on the following deficits Decreased balance;Abnormal gait   Rehab Potential Fair   PT Frequency One time visit   PT Treatment/Interventions Patient/family education   PT Next Visit Plan None at this time, but may benefit from therapy going forward for multiple mobility limitations.   PT Home Exercise Plan see education section   Consulted and Agree with Plan of Care Patient;Family member/caregiver         Problem List Patient Active Problem List   Diagnosis Date Noted  . Lung mass 08/28/2015  . Acute combined systolic and diastolic congestive heart failure ([place]) 08/20/2015  . Acute respiratory failure ([place]) 08/20/2015  . CAP (community acquired pneumonia) 08/16/2015  . Chronic combined systolic and diastolic heart  failure, NYHA class 2 ([place]) 09/03/2014  . Iron deficiency anemia 11/08/2013  . Chronic anticoagulation 08/14/2013  . Acute on chronic systolic CHF (congestive heart failure) ([place]) 08/10/2013  . Acute exacerbation of combined CHF  08/09/2013  . SOB (shortness of breath) 08/09/2013  . CAD s/p CABG '05, patent grafts 4/09, low risk Nuc 2011 05/07/2013  . Complete heart block ([place]) 05/07/2013  . Cardiomyopathy, ischemic- EF 35% 04/24/2013  . Overweight 04/24/2013  . Bladder outlet obstruction 01/22/2011  . Hearing loss on left 01/22/2011  . PAF- Amiodarone added 10/14 10/01/2008  . Cardiac pacemaker in situ- St Jude for CHB 7/09- pacer dependednt 10/01/2008  . BRONCHIAL PNEUMONIA 08/26/2008  . BRONCHITIS, CHRONIC, ACUTE EXACERBATION 12/28/2007  . COLONIC POLYPS 10/07/2007  . HYPERCHOLESTEROLEMIA 10/07/2007  . ANXIETY 10/07/2007  . OBSTRUCTIVE SLEEP APNEA 10/07/2007  . Essential hypertension 10/07/2007  . ARTERIOSCLEROTIC HEART DISEASE 10/07/2007  . CEREBROVASCULAR DISEASE 10/07/2007  . PVD - CEA '02, RCFA PTA '01 10/07/2007  . HEMORRHOIDS 10/07/2007  . COPD (chronic obstructive pulmonary disease) ([place]) 10/07/2007  . DIVERTICULOSIS OF COLON 10/07/2007  . DEGENERATIVE JOINT DISEASE 10/07/2007  . LOW BACK PAIN, CHRONIC 10/07/2007    Hildagarde Holleran 09/04/2015, 4:29 PM  [place], [place], [address] Phone: [phone]   Fax:  [phone]  Name: Todd Kelly MRN: [id number] Date of Birth: 26-Oct-1930   Serafina Royals, PT 09/04/2015 4:29 PM

## 2015-09-04 NOTE — Progress Notes (Signed)
[address]       [place] [address]             [phone]                    Todd Kelly [facility] Medical Record #[id number] Date of Birth: 02-15-1931  Referring: Curt Bears, MD Primary Care: Jonathon Bellows, MD  Chief Complaint:   Lung MASS   History of Present Illness:    Todd Kelly 81 y.o. male is seen in the office  today for central left upper lobe mass .the last 3 weeks he has been complaining of increasing shortness breath and cough productive of brownish sputum. He also has a weight loss of around 84 pounds in the last 3 years.After recent hospitalization for SOB and "pneuminia"   He  was seen by his primary care physician Dr. Justin Mend. She ordered chest x-ray which was performed on 08/26/2015 and it showed persistent left upper lobe changes. This was followed by CT scan of the chest with contrast on 08/27/2015 and it showed central left upper lobe lung mass, obstructing the left upper lobe bronchus. This measures 5.4 x 6.6 cm. The central left upper lobe mass invades the mediastinum and is intimately associated with the left pulmonary artery. An adjacent 9 mm AP window node is suspicious based on location. Left infrahilar node measures 12 mm. There was also a spiculated subpleural left upper lobe pulmonary nodule measuring 1.9 x 2.1 cm. There was a subtle irregularity involving the right hepatic capsule. The patient  complains of generalized weakness and fatigue in addition to shortness breath at baseline and increased with exertion and cough productive of clear light brownish sputum. He also has mild headache but attributed  to sinus problem. He also has some blurry vision. He denied having any significant nausea or vomiting   He has  past medical history significant for COPD, hypertension, obstructive sleep apnea, congestive heart failure, coronary artery disease, chronic back pain as well as long history of smoking but quit 20 years ago. I did  CABG on him 2005.  He lost around 84 pounds in the last 3 years, some of it was secondary to aggressive diuresis for his congestive heart failure.   He has a history of smoking more than one pack per day for around 50 years but quit 20 years ago. He has no history of alcohol or drug abuse.   The patient was referred today for presentation in the multidisciplinary conference. Radiology studies and pathology slides were presented there for review and discussion of treatment options. A consensus was discussed regarding potential next steps.  Currently patient on xarleto for Afib, he has no history of stroke or embolic events    Current Activity/ Functional Status:  Patient is not independent with mobility/ambulation, transfers, ADL's, IADL's.   Zubrod Score: At the time of surgery this patient's most appropriate activity status/level should be described as: '[]'$     0    Normal activity, no symptoms '[]'$     1    Restricted in physical strenuous activity but ambulatory, able to do out light work '[x]'$     2    Ambulatory and capable of self care, unable to do work activities, up and about               >50 % of waking hours                              '[]'$   3    Only limited self care, in bed greater than 50% of waking hours '[]'$     4    Completely disabled, no self care, confined to bed or chair '[]'$     5    Moribund   Past Medical History  Diagnosis Date  . OSA (obstructive sleep apnea)   . Bronchial pneumonia   . Bronchitis, chronic with acute exacerbation ([place])   . COPD (chronic obstructive pulmonary disease) ([place])   . Hypertension   . Arteriosclerotic heart disease   . Atrial fibrillation ([place])   . Cardiac pacemaker in situ 04/12/2008    [place]  . Cerebrovascular disease   . Peripheral vascular disease ([place])   . Hypercholesteremia   . Obesity   . Diverticulosis of colon   . Adenomatous polyp of colon 08/1994  . Hemorrhoids   . DJD (degenerative joint disease)   . Chronic  low back pain   . Anxiety   . CHF (congestive heart failure) ([place])   . CHB (complete heart block) ([place])   . Carotid arterial disease ([place])   . CAD (coronary artery disease)   . S/P CABG (coronary artery bypass graft) 2005    Past Surgical History  Procedure Laterality Date  . Pta to right common femoral artery  2001    Dr. Lyndel Safe  . Carotid endarterectomy  2002    bilateral.  Dr. Kellie Simmering  . Coronary artery bypass graft  2005  . Pacemaker placement  July 2009    for CHB  Dr. Gwenlyn Found. [employer]  . Appendectomy  1948  . US echocardiography  11/06/2011    Mod. LVH w/mod. depressed systolic function,EF 58-09%,XIPJA LV hypokinesis,mildly dilated LA,mild aortic root dilatation  . Nm myoview ltd  09/04/2010    mild perfusion defect basal inferior,mid inferior, & apical inferior regions. LV systolic function deteriorated since 2006  . Cardiac catheterization  02/02/2008    patent grafts  CABG in 2005 by me  Family History  Problem Relation Age of Onset  . Asthma Paternal Uncle   . Heart disease Paternal Grandmother   . Heart disease Mother   . Heart attack Father   . Rheum arthritis Father   . Rheum arthritis Paternal Uncle   . Pancreatic cancer Mother   . Hyperlipidemia Mother   Family history significant for a mother with pancreatic cancer and father had heart disease.  Social History   Social History  . Marital Status: Married    Spouse Name: Enid Derry  . Number of Children: N  . Years of Education: N/A   Occupational History  . [employer]     retired  . DRIVER    Social History Main Topics  . Smoking status: Former Smoker -- 1.50 packs/day for 54 years    Types: Cigarettes    Quit date: 10/18/1996  . Smokeless tobacco: Former Systems developer    Types: Chew     Comment: only used chewing tobacco while working in the yard a couple of times  . Alcohol Use: No  . Drug Use: No  . Sexual Activity: Not on file   Other Topics Concern  . Not on file   Social History  Narrative   No alcohol.   Drinks 1 cup of caffeine.    History  Smoking status  . Former Smoker -- 1.50 packs/day for 54 years  . Types: Cigarettes  . Quit date: 10/18/1996  Smokeless tobacco  . Former Systems developer  . Types: Chew  Comment: only used chewing tobacco while working in the yard a couple of times    History  Alcohol Use No   The patient is married and has no biologic children but stepchildren. He was accompanied today by his wife Enid Derry. He used to work in an Facilities manager.  Allergies  Allergen Reactions  . Penicillins Anaphylaxis and Hives    Has patient had a PCN reaction causing immediate rash, facial/tongue/throat swelling, SOB or lightheadedness with hypotension: Yes Has patient had a PCN reaction causing severe rash involving mucus membranes or skin necrosis: No Has patient had a PCN reaction that required hospitalization Yes Has patient had a PCN reaction occurring within the last 10 years: No If all of the above answers are "NO", then may proceed with Cephalosporin use.    Current Outpatient Prescriptions  Medication Sig Dispense Refill  . ALPRAZolam (XANAX) 0.5 MG tablet Take 1 tablet (0.5 mg total) by mouth 3 (three) times daily as needed for anxiety. Not to exceed 3 per day. (Patient taking differently: Take 0.5 mg by mouth 2 (two) times daily. Not to exceed 3 per day.) 270 tablet 1  . amiodarone (PACERONE) 200 MG tablet Take 1 tablet (200 mg total) by mouth daily. 90 tablet 3  . atorvastatin (LIPITOR) 20 MG tablet Take 1 tablet (20 mg total) by mouth daily. 90 tablet 3  . Coenzyme Q10 (COQ10) 100 MG CAPS Take 100 mg by mouth daily.     [name] Dextromethorphan-Guaifenesin (MUCINEX DM) 30-600 MG TB12 Take 1 tablet by mouth 2 (two) times daily.    [name] docusate sodium (COLACE) 250 MG capsule Take 250 mg by mouth daily after supper.    . ferrous sulfate 325 (65 FE) MG tablet Take 325 mg by mouth daily after supper.     . fluticasone (FLONASE) 50 MCG/ACT nasal spray  USE ONE SPRAY IN EACH NOSTRIL TWICE A DAY (Patient taking differently: USE ONE SPRAY IN EACH NOSTRIL TWICE A DAY AS NEEDED FOR CONGESTION) 52 g 2  . furosemide (LASIX) 40 MG tablet Take 1 tablet (40 mg total) by mouth daily. (Patient taking differently: Take 40 mg by mouth daily as needed (weight of 192 lb or more). ) 90 tablet 3  . HYDROcodone-acetaminophen (NORCO/VICODIN) 5-325 MG per tablet Take 1/2 to 1 tablet by mouth three times daily as needed for pain (Patient taking differently: Take 0.5-1 tablets by mouth 3 (three) times daily as needed (pain). ) 90 tablet 0  . levofloxacin (LEVAQUIN) 750 MG tablet Take 1 tablet (750 mg total) by mouth daily. 3 tablet 0  . losartan (COZAAR) 100 MG tablet Take 1 tablet (100 mg total) by mouth at bedtime. (Patient taking differently: Take 100 mg by mouth daily with supper. ) 10 tablet 0  . meclizine (ANTIVERT) 25 MG tablet Take 1/2 to 1 tablet by mouth every 6 hours as needed for dizziness (Patient taking differently: Take 25 mg by mouth 2 (two) times daily as needed for dizziness. ) 90 tablet 3  . metoprolol tartrate (LOPRESSOR) 25 MG tablet Take 1 tablet (25 mg total) by mouth 2 (two) times daily. (Patient taking differently: Take 12.5-25 mg by mouth 2 (two) times daily. Take 1/2 tablet (12.5 mg) in the morning and 1 tablet (25 mg) in the evening.) 180 tablet 3  . montelukast (SINGULAIR) 10 MG tablet TAKE 1 TABLET AT BEDTIME (Patient taking differently: TAKE 1 TABLET BY MOUTH DAILY AFTER SUPPER) 90 tablet 0  . Omega-3 Fatty Acids (FISH OIL)  1000 MG CAPS Take 1,000 mg by mouth daily after supper.     Vladimir Faster Glycol-Propyl Glycol (SYSTANE OP) Place 1 drop into both eyes 4 (four) times daily.    . potassium chloride (K-DUR) 10 MEQ tablet Take 20 mEq by mouth daily as needed (if taking lasix).    . predniSONE (DELTASONE) 5 MG tablet Take 5 mg by mouth daily.     . ranitidine (ZANTAC) 150 MG tablet Take 1 tablet (150 mg total) by mouth 2 (two) times daily.  (Patient taking differently: Take 150 mg by mouth daily as needed (acid reflux). ) 180 tablet 2  . rivaroxaban (XARELTO) 20 MG TABS tablet Take 1 tablet (20 mg total) by mouth daily with supper. 90 tablet 3  . SYMBICORT 160-4.5 MCG/ACT inhaler USE 2 INHALATIONS TWICE A DAY 30.6 g 1  . tiotropium (SPIRIVA HANDIHALER) 18 MCG inhalation capsule INHALE THE CONTENTS OF 1 CAPSULE DAILY (Patient taking differently: Place 18 mcg into inhaler and inhale daily after supper. INHALE THE CONTENTS OF 1 CAPSULE DAILY) 90 capsule 3  . vitamin B-12 (CYANOCOBALAMIN) 500 MCG tablet Take 500 mcg by mouth daily after supper.     . vitamin C (ASCORBIC ACID) 500 MG tablet Take 500 mg by mouth daily after supper.      No current facility-administered medications for this visit.   Facility-Administered Medications Ordered in Other Visits  Medication Dose Route Frequency Provider Last Rate Last Dose  . 0.9 %  sodium chloride infusion    Continuous PRN Durene Romans, CRNA          Review of Systems:     Cardiac Review of Systems: Y or N  Chest Pain [    ]  Resting SOB [   ] Exertional SOB  [  ]  Orthopnea [  ]   Pedal Edema [   ]    Palpitations [  ] Syncope  [  ]   Presyncope [   ]  General Review of Systems: [Y] = yes [  ]=no Constitional: recent weight change [  ];  Wt loss over the last 3 months [   ] anorexia [  ]; fatigue [  ]; nausea [  ]; night sweats [  ]; fever [  ]; or chills [  ];          Dental: poor dentition[  ]; Last Dentist visit:   Eye : blurred vision [  ]; diplopia [   ]; vision changes [  ];  Amaurosis fugax[  ]; Resp: cough [  ];  wheezing[  ];  hemoptysis[  ]; shortness of breath[  ]; paroxysmal nocturnal dyspnea[  ]; dyspnea on exertion[  ]; or orthopnea[  ];  GI:  gallstones[  ], vomiting[  ];  dysphagia[  ]; melena[  ];  hematochezia [  ]; heartburn[  ];   Hx of  Colonoscopy[  ]; GU: kidney stones [  ]; hematuria[  ];   dysuria [  ];  nocturia[  ];  history of     obstruction [  ];  urinary frequency [  ]             Skin: rash, swelling[  ];, hair loss[  ];  peripheral edema[  ];  or itching[  ]; Musculosketetal: myalgias[  ];  joint swelling[  ];  joint erythema[  ];  joint pain[  ];  back pain[  ];  Heme/Lymph: bruising[  ];  bleeding[  ];  anemia[  ];  Neuro: TIA[  ];  headaches[  ];  stroke[  ];  vertigo[  ];  seizures[  ];   paresthesias[  ];  difficulty walking[  ];  Psych:depression[  ]; anxiety[  ];  Endocrine: diabetes[  ];  thyroid dysfunction[  ];  Immunizations: Flu up to date [  ]; Pneumococcal up to date [  ];  Other:  Physical Exam: BP 116/53 mmHg  Pulse 68  Temp(Src) 98.3 F (36.8 C) (Oral)  Resp 17  Ht '5\' 8"'$  (1.727 m)  Wt 190 lb 6.4 oz (86.365 kg)  BMI 28.96 kg/m2  SpO2 96%  PHYSICAL EXAMINATION: General appearance: alert, cooperative, no distress and moderately obese Head: Normocephalic, without obvious abnormality, atraumatic Neck: no adenopathy, no carotid bruit, no JVD, supple, symmetrical, trachea midline and thyroid not enlarged, symmetric, no tenderness/mass/nodules Lymph nodes: Cervical, supraclavicular, and axillary nodes normal. Resp: diminished breath sounds bibasilar Back: symmetric, no curvature. ROM normal. No CVA tenderness. Cardio: irregularly irregular rhythm Genitalia: defer exam Extremities: sifnificant bruising both arms  Neurologic: Grossly normal  Diagnostic Studies & Laboratory data:     Recent Radiology Findings:   Dg Chest 2 View  08/26/2015  CLINICAL DATA:  Followup pneumonia EXAM: CHEST - 2 VIEW COMPARISON:  08/19/2015 FINDINGS: Cardiac shadow remains enlarged. A pacing device is again seen. The lungs are again well aerated. There is a persistent left suprahilar density extending into the left upper lobe. Given the persistent nature the possibility of a central mass with peripheral consolidation deserves consideration. No new focal infiltrate is seen. No bony abnormality is noted. IMPRESSION: Persistent left  upper lobe changes. CT with contrast is recommended for further evaluation to rule out an underlying mass lesion causing the upper lobe consolidation. Electronically Signed   By: Inez Catalina M.D.   On: 08/26/2015 12:32   Dg Chest 2 View  08/16/2015  CLINICAL DATA:  Increased shortness breath for 1 week. History of COPD and CHF. EXAM: CHEST  2 VIEW COMPARISON:  Chest x-rays dated 11/08/2013 and 08/09/2013. FINDINGS: Mild cardiomegaly is unchanged. There is central pulmonary vascular congestion which appears similar to previous exams. There is now probable mild bilateral interstitial edema. Left chest wall pacemaker/AICD is unchanged in position. Median sternotomy wires appear intact and stable in alignment. New confluent airspace opacity is seen within the inferior segment of the left upper lobe extending towards the left hilum. Additional ill-defined opacity at the right lung base. IMPRESSION: 1. Cardiomegaly with central pulmonary vascular congestion and mild bilateral interstitial edema suggesting mild CHF/volume overload. 2. New confluent airspace opacity within the inferior segment of the left upper lobe, extending towards the left hilum, which could represent pneumonia or asymmetric pulmonary edema. Favor pneumonia. 3. Additional opacity at the right lung base, more likely atelectasis and/or small effusion, but an additional basilar pneumonia cannot be excluded. These results were called by telephone at the time of interpretation on 08/16/2015 at 6:14 pm to Dr. Janne Napoleon , who verbally acknowledged these results. Electronically Signed   By: Franki Cabot M.D.   On: 08/16/2015 18:08   Dg Ribs Bilateral W/chest  08/19/2015  CLINICAL DATA:  81 year old with injury to the anterior lower left ribs 2 days ago. Initial encounter. Personal history of bilateral rib fractures. EXAM: BILATERAL RIBS AND CHEST - 4+ VIEW 1819 hr: COMPARISON:  Chest x-rays earlier same date 1139 hr and previously. No prior rib  imaging. FINDINGS: Site of maximum pain and tenderness was  marked with a metallic BB. No acute rib fracture identified. Old healed fractures involving the left lateral 8th rib and the right lateral 5th, 6th, 7th and 8th ribs and the right anterior 9th rib. Prior sternotomy for CABG. Cardiac silhouette markedly enlarged, unchanged. Further improvement in aeration in the right lower lobe since earlier today, with mild atelectasis persisting. Streaky and patchy airspace opacity in the left upper lobe, unchanged. No new pulmonary parenchymal abnormalities. Mild pulmonary venous hypertension without overt edema. Left subclavian dual lead transvenous pacemaker unchanged and intact. IMPRESSION: 1. No acute rib fractures identified. 2. Old healed bilateral rib fractures as detailed above. 3. Further improvement in aeration in the right lung base with mild atelectasis persisting. 4. Stable left upper lobe atelectasis and/or bronchopneumonia. 5. No new abnormalities. Electronically Signed   By: Evangeline Dakin M.D.   On: 08/19/2015 18:38   Ct Chest W Contrast  08/27/2015  CLINICAL DATA:  Recent pneumonia. Cough. Short of breath and weight loss. Ex-smoker. EXAM: CT CHEST WITH CONTRAST TECHNIQUE: Multidetector CT imaging of the chest was performed during intravenous contrast administration. CONTRAST:  62m ISOVUE-300 IOPAMIDOL (ISOVUE-300) INJECTION 61% COMPARISON:  Chest radiographs, most recent 1 day prior. No prior CT. FINDINGS: Mediastinum/Nodes: Aortic and branch vessel atherosclerosis. Ulcerative plaque throughout the descending thoracic aorta. Mild cardiomegaly with pacer in place. No pericardial effusion. Pulmonary artery enlargement, outflow tract 3.3 cm. The central left upper lobe mass invades the mediastinum and is intimately associated with the left pulmonary artery. An adjacent 9 mm AP window node is suspicious based on location. Left infrahilar node measures 12 mm (image 26,, series 3), suspicious.  Lungs/Pleura: Small right greater than left pleural effusions. Minimal patchy right upper lobe opacities are favored to represent areas of subsegmental atelectasis. Spiculated subpleural left upper lobe pulmonary nodule measures 1.9 x 2.1 cm (image 21, series 4). Central left upper lobe lung mass, obstructing the left upper lobe bronchus. This measures 5.4 x 6.6 cm (image 23, series 4). Upper abdomen: Apparent subtle irregularity involving the right lobe of the liver at approximately 9 mm (image 63, series 3). Normal imaged portions of the spleen, stomach, pancreas, right adrenal gland. Mild left adrenal thickening and nodularity. On the order of 1.6 cm. An upper pole left renal lesion is most consistent with a cyst or minimally complex cyst at approximately 9 mm. Abdominal aortic atherosclerosis. Musculoskeletal: Probable sebaceous cyst superficial to the spinous process at 1.6 cm (image 15, series 3). lateral right rib remote trauma. IMPRESSION: 1. Central left upper lobe lung mass, consistent with primary bronchogenic carcinoma. This has direct mediastinal invasion with left mediastinal and infrahilar adenopathy, suspicious for nodal metastasis. 2. A more peripheral left upper lobe spiculated nodule is suspicious for a synchronous primary bronchogenic carcinoma. 3. Small bilateral pleural effusions. 4. Subtle irregularity involving the right hepatic capsule. Indeterminate. 5. Left adrenal nodularity, also indeterminate. Consider PET with attention to these 2 areas. 6. Pulmonary artery enlargement suggests pulmonary arterial hypertension. These results will be called to the ordering clinician or representative by the Radiologist Assistant, and communication documented in the PACS or zVision Dashboard. Electronically Signed   By: KAbigail MiyamotoM.D.   On: 08/27/2015 09:30   Dg Chest Port 1 View  08/19/2015  CLINICAL DATA:  81year old male with left chest pain. Rib fractures. EXAM: PORTABLE CHEST 1 VIEW  COMPARISON:  08/16/2015 and prior chest radiographs FINDINGS: Cardiomegaly, CABG changes and left-sided pacemaker again noted. Focal left upper lobe opacity/atelectasis/ airspace disease is unchanged. Improved right  basilar aeration noted with mild right basilar atelectasis present. There is no evidence of pneumothorax. There is a trace right pleural effusion present. Fractures of the right fifth through eighth ribs are noted. IMPRESSION: Improved right basilar aeration without other significant change. Continued left upper lobe opacity which may represent atelectasis or airspace disease. Fractures of the right fifth through eighth ribs. Trace right pleural effusion. Electronically Signed   By: Margarette Canada M.D.   On: 08/19/2015 13:28     I have independently reviewed the above radiologic studies.  Recent Lab Findings: Lab Results  Component Value Date   WBC 10.7* 09/04/2015   HGB 12.7* 09/04/2015   HCT 39.7 09/04/2015   PLT 153 09/04/2015   GLUCOSE 117 09/04/2015   CHOL 108 11/02/2013   TRIG 70.0 11/02/2013   HDL 39.30 11/02/2013   LDLDIRECT 60.7 10/07/2009   LDLCALC 55 11/02/2013   ALT <9 09/04/2015   AST 13 09/04/2015   NA 144 09/04/2015   K 4.4 09/04/2015   CL 99* 08/20/2015   CREATININE 1.2 09/04/2015   BUN 26.4* 09/04/2015   CO2 29 09/04/2015   TSH 1.724 08/17/2015   INR 1.52* 08/17/2015   HGBA1C 6.5 10/05/2011   T Assessment / Plan:    81 years old white male with questionably stage III/IV lung cancer highly suspicious for non-small cell carcinoma, presented with central left upper lobe mass with direct invasion of the mediastinum as well as mediastinal and hilar lymphadenopathy as well as questionable right pleural effusion and small liver lesion. Needs tissue dx Currently on xarleto will hold 5 days due to age, procedure with high risk if  bleeding occurs and decreased renal function Discussed with the  patient options to get tissue dx, including bronch, EBUS  and bx vs  needle bx of peripheral lesion. Patient prefers to have  bronchoscopy , EBUS and biopsy with goal of getting tissue for genomic testing Will stop xarelto and plan surgery Nov 22.       I  spent 40 minutes counseling the patient face to face and 50% or more the  time was spent in counseling and coordination of care. The total time spent in the appointment was 60 minutes.  Grace Isaac MD      [address] [place] [address] Office [phone]   Beeper [phone]  09/04/2015 10:14 PM

## 2015-09-05 ENCOUNTER — Other Ambulatory Visit: Payer: Self-pay | Admitting: *Deleted

## 2015-09-05 ENCOUNTER — Encounter: Payer: Self-pay | Admitting: *Deleted

## 2015-09-05 DIAGNOSIS — R918 Other nonspecific abnormal finding of lung field: Secondary | ICD-10-CM

## 2015-09-05 NOTE — Progress Notes (Signed)
Oncology Nurse Navigator Documentation  Oncology Nurse Navigator Flowsheets 09/05/2015  Navigator Encounter Type Other/I called central scheduling to see if there was an earlier appt for PET scan.  I was told there was not at this time.  I asked for him to be called with any cancellations.  I was told they do not call if there are cancellations.  I stated I would call back Monday.    Patient Visit Type -  Treatment Phase Abnormal Scans  Interventions Coordination of Care  Coordination of Care Radiology  Time Spent with Patient 30

## 2015-09-08 ENCOUNTER — Encounter (HOSPITAL_COMMUNITY)
Admission: RE | Admit: 2015-09-08 | Discharge: 2015-09-08 | Disposition: A | Discharge status: Home / Self Care | Payer: Commercial Managed Care - HMO | Source: Ambulatory Visit | Attending: Cardiothoracic Surgery | Admitting: Cardiothoracic Surgery

## 2015-09-08 ENCOUNTER — Encounter (HOSPITAL_COMMUNITY): Payer: Self-pay

## 2015-09-08 ENCOUNTER — Other Ambulatory Visit: Payer: Self-pay | Admitting: Internal Medicine

## 2015-09-08 ENCOUNTER — Ambulatory Visit (HOSPITAL_COMMUNITY)
Admission: RE | Admit: 2015-09-08 | Discharge: 2015-09-08 | Disposition: A | Discharge status: Home / Self Care | Payer: Commercial Managed Care - HMO | Source: Ambulatory Visit | Attending: Internal Medicine | Admitting: Internal Medicine

## 2015-09-08 ENCOUNTER — Ambulatory Visit (HOSPITAL_COMMUNITY)
Admission: RE | Admit: 2015-09-08 | Discharge: 2015-09-08 | Disposition: A | Discharge status: Home / Self Care | Payer: Commercial Managed Care - HMO | Source: Ambulatory Visit | Attending: Cardiothoracic Surgery | Admitting: Cardiothoracic Surgery

## 2015-09-08 VITALS — BP 98/40 | HR 65 | Temp 97.7°F | Resp 18 | Ht 68.0 in | Wt 191.1 lb

## 2015-09-08 DIAGNOSIS — G4733 Obstructive sleep apnea (adult) (pediatric): Secondary | ICD-10-CM | POA: Insufficient documentation

## 2015-09-08 DIAGNOSIS — I4891 Unspecified atrial fibrillation: Secondary | ICD-10-CM | POA: Insufficient documentation

## 2015-09-08 DIAGNOSIS — I999 Unspecified disorder of circulatory system: Secondary | ICD-10-CM | POA: Diagnosis not present

## 2015-09-08 DIAGNOSIS — I10 Essential (primary) hypertension: Secondary | ICD-10-CM | POA: Diagnosis not present

## 2015-09-08 DIAGNOSIS — G319 Degenerative disease of nervous system, unspecified: Secondary | ICD-10-CM | POA: Diagnosis not present

## 2015-09-08 DIAGNOSIS — R938 Abnormal findings on diagnostic imaging of other specified body structures: Secondary | ICD-10-CM | POA: Diagnosis present

## 2015-09-08 DIAGNOSIS — J449 Chronic obstructive pulmonary disease, unspecified: Secondary | ICD-10-CM | POA: Insufficient documentation

## 2015-09-08 DIAGNOSIS — R918 Other nonspecific abnormal finding of lung field: Secondary | ICD-10-CM | POA: Insufficient documentation

## 2015-09-08 DIAGNOSIS — D126 Benign neoplasm of colon, unspecified: Secondary | ICD-10-CM | POA: Insufficient documentation

## 2015-09-08 DIAGNOSIS — N320 Bladder-neck obstruction: Secondary | ICD-10-CM | POA: Diagnosis not present

## 2015-09-08 DIAGNOSIS — I679 Cerebrovascular disease, unspecified: Secondary | ICD-10-CM | POA: Insufficient documentation

## 2015-09-08 DIAGNOSIS — E7800 Pure hypercholesterolemia, unspecified: Secondary | ICD-10-CM | POA: Insufficient documentation

## 2015-09-08 DIAGNOSIS — Z01812 Encounter for preprocedural laboratory examination: Secondary | ICD-10-CM | POA: Insufficient documentation

## 2015-09-08 HISTORY — DX: Presence of cardiac pacemaker: Z95.0

## 2015-09-08 HISTORY — DX: Headache, unspecified: R51.9

## 2015-09-08 HISTORY — DX: Headache: R51

## 2015-09-08 HISTORY — DX: Reserved for inherently not codable concepts without codable children: IMO0001

## 2015-09-08 HISTORY — DX: Unspecified hearing loss, unspecified ear: H91.90

## 2015-09-08 LAB — APTT: aPTT: 29 seconds (ref 24–37)

## 2015-09-08 LAB — COMPREHENSIVE METABOLIC PANEL
ALT: 11 U/L — ABNORMAL LOW (ref 17–63)
AST: 13 U/L — ABNORMAL LOW (ref 15–41)
Albumin: 3.1 g/dL — ABNORMAL LOW (ref 3.5–5.0)
Alkaline Phosphatase: 54 U/L (ref 38–126)
Anion gap: 7 (ref 5–15)
BUN: 27 mg/dL — ABNORMAL HIGH (ref 6–20)
CO2: 29 mmol/L (ref 22–32)
Calcium: 8.8 mg/dL — ABNORMAL LOW (ref 8.9–10.3)
Chloride: 109 mmol/L (ref 101–111)
Creatinine, Ser: 1.52 mg/dL — ABNORMAL HIGH (ref 0.61–1.24)
GFR calc Af Amer: 47 mL/min — ABNORMAL LOW (ref >60)
GFR calc non Af Amer: 41 mL/min — ABNORMAL LOW (ref >60)
Glucose, Bld: 102 mg/dL — ABNORMAL HIGH (ref 65–99)
Potassium: 4.1 mmol/L (ref 3.5–5.1)
Sodium: 145 mmol/L (ref 135–145)
Total Bilirubin: 0.6 mg/dL (ref 0.3–1.2)
Total Protein: 5.9 g/dL — ABNORMAL LOW (ref 6.5–8.1)

## 2015-09-08 LAB — CBC
HCT: 40.6 % (ref 39.0–52.0)
Hemoglobin: 12.8 g/dL — ABNORMAL LOW (ref 13.0–17.0)
MCH: 30.2 pg (ref 26.0–34.0)
MCHC: 31.5 g/dL (ref 30.0–36.0)
MCV: 95.8 fL (ref 78.0–100.0)
Platelets: 155 10*3/uL (ref 150–400)
RBC: 4.24 MIL/uL (ref 4.22–5.81)
RDW: 14.7 % (ref 11.5–15.5)
WBC: 9.4 10*3/uL (ref 4.0–10.5)

## 2015-09-08 LAB — PROTIME-INR
INR: 1.14 (ref 0.00–1.49)
Prothrombin Time: 14.7 seconds (ref 11.6–15.2)

## 2015-09-08 MED ORDER — IOHEXOL 300 MG/ML  SOLN
75.0000 mL | Freq: Once | INTRAMUSCULAR | Status: AC | PRN
  Administered 2015-09-08: 75 mL via INTRAVENOUS

## 2015-09-08 NOTE — Progress Notes (Signed)
Mr Haydon denies chest pain, is short of breath with exertion, has a history of COPD.  Blood pressure is 94/40, HR 66 when he arrived to PAT.  Mr Shughart had taken blood pressure medications and 1/2 of a Vicodin this am.  Patient did not check blood pressure prior to taking medicines. "Blood pressure usually runs 120/70."  Blood pressure rechecked after PAT interview, manual blood pressure 98/40, heart rate 65.  Mrs Ramirez reports that patient has been lying around most of the month, "if he is not in the bed, he is in the recliner"  I notified Levonne Spiller at Dr Everrett Coombe office and she said that she will speak to patients cardiologist.  Thurmond Butts also instructed me to tell patient to not take Amoidarone tomorrow before surgery, may cause nausea on an empty stomach.  Patient and his wife notified.

## 2015-09-08 NOTE — Progress Notes (Signed)
   09/08/15 1142  OBSTRUCTIVE SLEEP APNEA  Have you ever been diagnosed with sleep apnea through a sleep study? No  Do you snore loudly (loud enough to be heard through closed doors)?  0  Do you often feel tired, fatigued, or sleepy during the daytime (such as falling asleep during driving or talking to someone)? 1  Has anyone observed you stop breathing during your sleep? 0  Do you have, or are you being treated for high blood pressure? 1  BMI more than 35 kg/m2? 0  Age > 50 (1-yes) 1  Neck circumference greater than:Male 16 inches or larger, Male 17inches or larger? 1 (6)  Male Gender (Yes=1) 1  Obstructive Sleep Apnea Score 5

## 2015-09-08 NOTE — Progress Notes (Addendum)
I notifed [facility]  Answering service that patient will be here for OR 09/09/15.  Patient arriving at 5:30 AM.  I did not receive a call back from [facility].

## 2015-09-08 NOTE — Progress Notes (Signed)
I

## 2015-09-08 NOTE — Pre-Procedure Instructions (Addendum)
    Todd Kelly  09/08/2015    Your procedure is scheduled on Tuesday, November 21.  Report to [facility] Admitting at 5:30A.M.              Your surgery is scheduled for 7:30A.M.    Call this number if you have problems the morning of surgery: [phone]                   Remember:  Do not eat food or drink liquids after midnight.  Take these medicines the morning of surgery with A SIP OF WATER :amiodarone (PACERONE), atorvastatin (LIPITOR), Dextromethorphan-Guaifenesin (MUCINEX DM),  metoprolol tartrate (LOPRESSOR), ranitidine (ZANTAC).                  You may use Nasal Sprays and inhalers                Stop Xarelto as instructed by Dr Servando Snare.                 Stop taking Vitamins               Do Not wear jewlery or nail polish.   Do not wear lotions, powders, or perfumes.           Men may shave face and neck.   Do not bring valuables to the hospital.   [facility] is not responsible for any belongings or valuables.  Contacts, dentures or bridgework may not be worn into surgery.  Leave your suitcase in the car.  After surgery it may be brought to your room.  For patients admitted to the hospital, discharge time will be determined by your treatment team.  Patients discharged the day of surgery will not be allowed to drive home.   Name and phone number of your driver:   -  Special instructions: Review  [facility] - Preparing For Surgery.  Please read over the following fact sheets that you were given. Pain Booklet, Coughing and Deep Breathing and Surgical Site Infection Prevention

## 2015-09-09 ENCOUNTER — Encounter (HOSPITAL_COMMUNITY): Payer: Self-pay | Admitting: Certified Registered"

## 2015-09-09 ENCOUNTER — Encounter (HOSPITAL_COMMUNITY)
Admission: RE | Disposition: A | Discharge status: Home / Self Care | Payer: Commercial Managed Care - HMO | Source: Ambulatory Visit | Attending: Cardiothoracic Surgery

## 2015-09-09 ENCOUNTER — Ambulatory Visit (HOSPITAL_COMMUNITY)
Admission: RE | Admit: 2015-09-09 | Discharge: 2015-09-09 | Disposition: A | Discharge status: Home / Self Care | Payer: Commercial Managed Care - HMO | Source: Ambulatory Visit | Attending: Cardiothoracic Surgery | Admitting: Cardiothoracic Surgery

## 2015-09-09 ENCOUNTER — Ambulatory Visit (HOSPITAL_COMMUNITY): Payer: Commercial Managed Care - HMO | Admitting: Certified Registered"

## 2015-09-09 DIAGNOSIS — G8929 Other chronic pain: Secondary | ICD-10-CM | POA: Diagnosis not present

## 2015-09-09 DIAGNOSIS — G4733 Obstructive sleep apnea (adult) (pediatric): Secondary | ICD-10-CM | POA: Insufficient documentation

## 2015-09-09 DIAGNOSIS — D649 Anemia, unspecified: Secondary | ICD-10-CM | POA: Insufficient documentation

## 2015-09-09 DIAGNOSIS — Z951 Presence of aortocoronary bypass graft: Secondary | ICD-10-CM | POA: Diagnosis not present

## 2015-09-09 DIAGNOSIS — C3412 Malignant neoplasm of upper lobe, left bronchus or lung: Secondary | ICD-10-CM | POA: Insufficient documentation

## 2015-09-09 DIAGNOSIS — I4891 Unspecified atrial fibrillation: Secondary | ICD-10-CM | POA: Insufficient documentation

## 2015-09-09 DIAGNOSIS — I10 Essential (primary) hypertension: Secondary | ICD-10-CM | POA: Diagnosis not present

## 2015-09-09 DIAGNOSIS — Z87891 Personal history of nicotine dependence: Secondary | ICD-10-CM | POA: Diagnosis not present

## 2015-09-09 DIAGNOSIS — I442 Atrioventricular block, complete: Secondary | ICD-10-CM | POA: Insufficient documentation

## 2015-09-09 DIAGNOSIS — Z7952 Long term (current) use of systemic steroids: Secondary | ICD-10-CM | POA: Insufficient documentation

## 2015-09-09 DIAGNOSIS — M1990 Unspecified osteoarthritis, unspecified site: Secondary | ICD-10-CM | POA: Diagnosis not present

## 2015-09-09 DIAGNOSIS — Z950 Presence of cardiac pacemaker: Secondary | ICD-10-CM | POA: Diagnosis not present

## 2015-09-09 DIAGNOSIS — Z7901 Long term (current) use of anticoagulants: Secondary | ICD-10-CM | POA: Diagnosis not present

## 2015-09-09 DIAGNOSIS — Z6835 Body mass index (BMI) 35.0-35.9, adult: Secondary | ICD-10-CM | POA: Diagnosis not present

## 2015-09-09 DIAGNOSIS — R918 Other nonspecific abnormal finding of lung field: Secondary | ICD-10-CM | POA: Diagnosis present

## 2015-09-09 DIAGNOSIS — E7800 Pure hypercholesterolemia, unspecified: Secondary | ICD-10-CM | POA: Diagnosis not present

## 2015-09-09 DIAGNOSIS — I2510 Atherosclerotic heart disease of native coronary artery without angina pectoris: Secondary | ICD-10-CM | POA: Diagnosis not present

## 2015-09-09 DIAGNOSIS — E669 Obesity, unspecified: Secondary | ICD-10-CM | POA: Insufficient documentation

## 2015-09-09 DIAGNOSIS — Z7951 Long term (current) use of inhaled steroids: Secondary | ICD-10-CM | POA: Insufficient documentation

## 2015-09-09 DIAGNOSIS — I509 Heart failure, unspecified: Secondary | ICD-10-CM | POA: Diagnosis not present

## 2015-09-09 DIAGNOSIS — J449 Chronic obstructive pulmonary disease, unspecified: Secondary | ICD-10-CM | POA: Diagnosis not present

## 2015-09-09 DIAGNOSIS — Z79899 Other long term (current) drug therapy: Secondary | ICD-10-CM | POA: Diagnosis not present

## 2015-09-09 DIAGNOSIS — I739 Peripheral vascular disease, unspecified: Secondary | ICD-10-CM | POA: Diagnosis not present

## 2015-09-09 HISTORY — PX: VIDEO BRONCHOSCOPY WITH ENDOBRONCHIAL ULTRASOUND: SHX6177

## 2015-09-09 SURGERY — BRONCHOSCOPY, WITH EBUS
Anesthesia: General

## 2015-09-09 MED ORDER — PROPOFOL 10 MG/ML IV BOLUS
INTRAVENOUS | Status: AC
  Filled 2015-09-09: qty 20

## 2015-09-09 MED ORDER — ROCURONIUM BROMIDE 100 MG/10ML IV SOLN
INTRAVENOUS | Status: DC | PRN
Start: 2015-09-09 — End: 2015-09-09
  Administered 2015-09-09: 40 mg via INTRAVENOUS
  Administered 2015-09-09: 10 mg via INTRAVENOUS

## 2015-09-09 MED ORDER — LIDOCAINE HCL (CARDIAC) 20 MG/ML IV SOLN
INTRAVENOUS | Status: AC
  Filled 2015-09-09: qty 5

## 2015-09-09 MED ORDER — ROCURONIUM BROMIDE 50 MG/5ML IV SOLN
INTRAVENOUS | Status: AC
  Filled 2015-09-09: qty 1

## 2015-09-09 MED ORDER — DEXTROSE 5 % IV SOLN
10.0000 mg | INTRAVENOUS | Status: DC | PRN
  Administered 2015-09-09: 50 ug/min via INTRAVENOUS

## 2015-09-09 MED ORDER — 0.9 % SODIUM CHLORIDE (POUR BTL) OPTIME
TOPICAL | Status: DC | PRN
  Administered 2015-09-09: 1000 mL

## 2015-09-09 MED ORDER — EPHEDRINE SULFATE 50 MG/ML IJ SOLN
INTRAMUSCULAR | Status: AC
  Filled 2015-09-09: qty 1

## 2015-09-09 MED ORDER — ARTIFICIAL TEARS OP OINT
TOPICAL_OINTMENT | OPHTHALMIC | Status: AC
  Filled 2015-09-09: qty 3.5

## 2015-09-09 MED ORDER — ONDANSETRON HCL 4 MG/2ML IJ SOLN
INTRAMUSCULAR | Status: AC
  Filled 2015-09-09: qty 2

## 2015-09-09 MED ORDER — FENTANYL CITRATE (PF) 100 MCG/2ML IJ SOLN: 25.0000 ug | INTRAMUSCULAR | Status: DC | PRN

## 2015-09-09 MED ORDER — LACTATED RINGERS IV SOLN
INTRAVENOUS | Status: DC | PRN
  Administered 2015-09-09: 07:00:00 via INTRAVENOUS

## 2015-09-09 MED ORDER — FENTANYL CITRATE (PF) 250 MCG/5ML IJ SOLN
INTRAMUSCULAR | Status: AC
  Filled 2015-09-09: qty 5

## 2015-09-09 MED ORDER — ONDANSETRON HCL 4 MG/2ML IJ SOLN
INTRAMUSCULAR | Status: DC | PRN
  Administered 2015-09-09: 4 mg via INTRAVENOUS

## 2015-09-09 MED ORDER — LIDOCAINE HCL (CARDIAC) 20 MG/ML IV SOLN
INTRAVENOUS | Status: DC | PRN
  Administered 2015-09-09: 50 mg via INTRAVENOUS

## 2015-09-09 MED ORDER — LIDOCAINE HCL 4 % MT SOLN
OROMUCOSAL | Status: DC | PRN
  Administered 2015-09-09: 4 mL via TOPICAL

## 2015-09-09 MED ORDER — SUGAMMADEX SODIUM 200 MG/2ML IV SOLN
INTRAVENOUS | Status: DC | PRN
  Administered 2015-09-09: 1.7 mg via INTRAVENOUS

## 2015-09-09 MED ORDER — EPHEDRINE SULFATE 50 MG/ML IJ SOLN
INTRAMUSCULAR | Status: DC | PRN
  Administered 2015-09-09 (×2): 5 mg via INTRAVENOUS

## 2015-09-09 MED ORDER — EPINEPHRINE HCL 1 MG/ML IJ SOLN
INTRAMUSCULAR | Status: AC
  Filled 2015-09-09: qty 2

## 2015-09-09 MED ORDER — SUGAMMADEX SODIUM 200 MG/2ML IV SOLN
INTRAVENOUS | Status: AC
  Filled 2015-09-09: qty 2

## 2015-09-09 MED ORDER — FENTANYL CITRATE (PF) 100 MCG/2ML IJ SOLN
INTRAMUSCULAR | Status: DC | PRN
  Administered 2015-09-09: 50 ug via INTRAVENOUS

## 2015-09-09 MED ORDER — PROPOFOL 10 MG/ML IV BOLUS
INTRAVENOUS | Status: DC | PRN
  Administered 2015-09-09: 50 mg via INTRAVENOUS

## 2015-09-09 MED ORDER — EPINEPHRINE HCL 1 MG/ML IJ SOLN
INTRAMUSCULAR | Status: DC | PRN
  Administered 2015-09-09: 1 mg via ENDOTRACHEOPULMONARY

## 2015-09-09 MED ORDER — SUCCINYLCHOLINE CHLORIDE 20 MG/ML IJ SOLN
INTRAMUSCULAR | Status: AC
  Filled 2015-09-09: qty 1

## 2015-09-09 MED ORDER — SODIUM CHLORIDE 0.9 % IJ SOLN
INTRAMUSCULAR | Status: AC
  Filled 2015-09-09: qty 10

## 2015-09-09 SURGICAL SUPPLY — 27 items
BRUSH CYTOL CELLEBRITY 1.5X140 (MISCELLANEOUS) ×2 IMPLANT
CANISTER SUCTION 2500CC (MISCELLANEOUS) ×2 IMPLANT
CONT SPEC 4OZ CLIKSEAL STRL BL (MISCELLANEOUS) ×2 IMPLANT
COVER DOME SNAP 22 D (MISCELLANEOUS) ×2 IMPLANT
COVER TABLE BACK 60X90 (DRAPES) ×2 IMPLANT
FORCEPS BIOP RJ4 1.8 (CUTTING FORCEPS) ×2 IMPLANT
GAUZE SPONGE 4X4 12PLY STRL (GAUZE/BANDAGES/DRESSINGS) ×2 IMPLANT
GLOVE BIO SURGEON STRL SZ 6.5 (GLOVE) ×4 IMPLANT
GOWN STRL REUS W/ TWL LRG LVL3 (GOWN DISPOSABLE) ×2 IMPLANT
GOWN STRL REUS W/TWL LRG LVL3 (GOWN DISPOSABLE) ×2
KIT CLEAN ENDO COMPLIANCE (KITS) ×6 IMPLANT
KIT ROOM TURNOVER OR (KITS) ×2 IMPLANT
MARKER SKIN DUAL TIP RULER LAB (MISCELLANEOUS) ×2 IMPLANT
NEEDLE BIOPSY TRANSBRONCH 21G (NEEDLE) IMPLANT
NEEDLE BLUNT 18X1 FOR OR ONLY (NEEDLE) IMPLANT
NEEDLE FILTER BLUNT 18X 1/2SAF (NEEDLE) ×1
NEEDLE FILTER BLUNT 18X1 1/2 (NEEDLE) ×1 IMPLANT
NEEDLE SONO TIP II EBUS (NEEDLE) ×2 IMPLANT
NS IRRIG 1000ML POUR BTL (IV SOLUTION) ×2 IMPLANT
OIL SILICONE PENTAX (PARTS (SERVICE/REPAIRS)) ×2 IMPLANT
PAD ARMBOARD 7.5X6 YLW CONV (MISCELLANEOUS) ×4 IMPLANT
SYR 20CC LL (SYRINGE) ×2 IMPLANT
SYR 20ML ECCENTRIC (SYRINGE) ×2 IMPLANT
SYR 3ML LL SCALE MARK (SYRINGE) ×2 IMPLANT
TOWEL OR 17X24 6PK STRL BLUE (TOWEL DISPOSABLE) ×2 IMPLANT
TRAP SPECIMEN MUCOUS 40CC (MISCELLANEOUS) ×2 IMPLANT
TUBE CONNECTING 20X1/4 (TUBING) ×2 IMPLANT

## 2015-09-09 NOTE — Brief Op Note (Addendum)
      [address]       [place] [address]             [phone]       8:53 AM  PATIENT:  Todd Kelly  81 y.o. male  PRE-OPERATIVE DIAGNOSIS:  LEFT LUNG MASS  POST-OPERATIVE DIAGNOSIS:  LEFT LUNG MASS, likely non small cell by cytology, final pending  PROCEDURE:  Procedure(s): VIDEO BRONCHOSCOPY WITH ENDOBRONCHIAL ULTRASOUND (N/A) Bronchial Biopsy with transbronchial biopsy and bronchial biopsy   SURGEON:  Surgeon(s) and Role:    * Grace Isaac, MD - Primary   ANESTHESIA:   general  EBL:   minimial  BLOOD ADMINISTERED:none  DRAINS: none   LOCAL MEDICATIONS USED:  NONE  SPECIMEN:  Source of Specimen:  # 7 node, #10L node and left upper lobe  DISPOSITION OF SPECIMEN:  PATHOLOGY  COUNTS:  YES  DICTATION: .Dragon Dictation  PLAN OF CARE: Discharge to home after PACU  PATIENT DISPOSITION:  PACU - hemodynamically stable.   Delay start of Pharmacological VTE agent (>24hrs) due to surgical blood loss or risk of bleeding: yes  See photo under media tab

## 2015-09-09 NOTE — Transfer of Care (Signed)
Immediate Anesthesia Transfer of Care Note  Patient: Todd Kelly  Procedure(s) Performed: Procedure(s): VIDEO BRONCHOSCOPY WITH ENDOBRONCHIAL ULTRASOUND (N/A)  Patient Location: PACU  Anesthesia Type:General  Level of Consciousness: awake  Airway & Oxygen Therapy: Patient Spontanous Breathing and Patient connected to nasal cannula oxygen  Post-op Assessment: Report given to RN  Post vital signs: Reviewed and stable  Last Vitals:  Filed Vitals:   09/09/15 0634  BP: 162/58  Pulse: 60  Temp: 36.6 C  Resp: 16    Complications: No apparent anesthesia complications

## 2015-09-09 NOTE — Progress Notes (Signed)
Dr Jefm Petty here to see pt. O2 sat 93-96% on RA, no SOB, pulls 1500cc on IS. OK to DC to home.

## 2015-09-09 NOTE — Anesthesia Preprocedure Evaluation (Addendum)
Anesthesia Evaluation  Patient identified by MRN, date of birth, ID band Patient awake    Reviewed: Allergy & Precautions, H&P , NPO status , Patient's Chart, lab work & pertinent test results  Airway Mallampati: II  TM Distance: >3 FB Neck ROM: Full    Dental no notable dental hx. (+) Teeth Intact, Dental Advisory Given   Pulmonary sleep apnea , COPD,  COPD inhaler, former smoker,    Pulmonary exam normal breath sounds clear to auscultation       Cardiovascular hypertension, Pt. on medications and Pt. on home beta blockers + CAD, + CABG, + Peripheral Vascular Disease and +CHF  + dysrhythmias Atrial Fibrillation + pacemaker + Valvular Problems/Murmurs AS  Rhythm:Regular Rate:Normal     Neuro/Psych Anxiety negative neurological ROS  negative psych ROS   GI/Hepatic negative GI ROS, Neg liver ROS,   Endo/Other  negative endocrine ROS  Renal/GU negative Renal ROS  negative genitourinary   Musculoskeletal  (+) Arthritis , Osteoarthritis,    Abdominal   Peds  Hematology negative hematology ROS (+) anemia ,   Anesthesia Other Findings   Reproductive/Obstetrics negative OB ROS                           Anesthesia Physical Anesthesia Plan  ASA: IV  Anesthesia Plan: General   Post-op Pain Management:    Induction: Intravenous  Airway Management Planned: Oral ETT  Additional Equipment:   Intra-op Plan:   Post-operative Plan: Extubation in OR  Informed Consent: I have reviewed the patients History and Physical, chart, labs and discussed the procedure including the risks, benefits and alternatives for the proposed anesthesia with the patient or authorized representative who has indicated his/her understanding and acceptance.   Dental advisory given  Plan Discussed with: CRNA  Anesthesia Plan Comments:         Anesthesia Quick Evaluation

## 2015-09-09 NOTE — Anesthesia Postprocedure Evaluation (Signed)
Anesthesia Post Note  Patient: Todd Kelly  Procedure(s) Performed: Procedure(s) (LRB): VIDEO BRONCHOSCOPY WITH ENDOBRONCHIAL ULTRASOUND (N/A)  Patient location during evaluation: PACU Anesthesia Type: General Level of consciousness: awake and alert Pain management: pain level controlled Vital Signs Assessment: post-procedure vital signs reviewed and stable Respiratory status: spontaneous breathing, nonlabored ventilation and respiratory function stable Cardiovascular status: blood pressure returned to baseline and stable Postop Assessment: No signs of nausea or vomiting Anesthetic complications: no    Last Vitals:  Filed Vitals:   09/09/15 1008 09/09/15 1020  BP:  108/54  Pulse: 64   Temp:    Resp: 20 18    Last Pain: There were no vitals filed for this visit.               Marzelle Rutten,W. EDMOND

## 2015-09-09 NOTE — Anesthesia Procedure Notes (Signed)
Procedure Name: Intubation Date/Time: 09/09/2015 7:41 AM Performed by: Barrington Ellison Pre-anesthesia Checklist: Patient identified, Emergency Drugs available, Suction available, Patient being monitored and Timeout performed Patient Re-evaluated:Patient Re-evaluated prior to inductionOxygen Delivery Method: Circle system utilized Preoxygenation: Pre-oxygenation with 100% oxygen Intubation Type: IV induction Ventilation: Mask ventilation without difficulty Laryngoscope Size: Mac and 3 Grade View: Grade I Tube type: Oral Tube size: 8.5 mm Number of attempts: 1 Airway Equipment and Method: Stylet and LTA kit utilized Placement Confirmation: ETT inserted through vocal cords under direct vision,  positive ETCO2 and breath sounds checked- equal and bilateral Secured at: 22 cm Tube secured with: Tape Dental Injury: Teeth and Oropharynx as per pre-operative assessment

## 2015-09-09 NOTE — Discharge Instructions (Signed)
Flexible Bronchoscopy, Care After Refer to this sheet in the next few weeks. These instructions provide you with information on caring for yourself after your procedure. Your health care provider may also give you more specific instructions. Your treatment has been planned according to current medical practices, but problems sometimes occur. Call your health care provider if you have any problems or questions after your procedure.  WHAT TO EXPECT AFTER THE PROCEDURE It is normal to have the following symptoms for 24-48 hours after the procedure:   Increased cough.  Low-grade fever.  Sore throat or hoarse voice.  Small streaks of blood in your thick spit (sputum) if tissue samples were taken (biopsy). HOME CARE INSTRUCTIONS   Do not eat or drink anything for 2 hours after your procedure. Your nose and throat were numbed by medicine. If you try to eat or drink before the medicine wears off, food or drink could go into your lungs or you could burn yourself. After the numbness is gone and your cough and gag reflexes have returned, you may eat soft food and drink liquids slowly.   The day after the procedure, you can go back to your normal diet.   You may resume normal activities.   Keep all follow-up visits as directed by your health care provider. It is important to keep all your appointments, especially if tissue samples were taken for testing (biopsy). SEEK IMMEDIATE MEDICAL CARE IF:   You have increasing shortness of breath.   You become light-headed or faint.   You have chest pain.   You have any new concerning symptoms.  You cough up more than a small amount of blood.  The amount of blood you cough up increases. MAKE SURE YOU:  Understand these instructions.  Will watch your condition.  Will get help right away if you are not doing well or get worse.   This information is not intended to replace advice given to you by your health care provider. Make sure you discuss  any questions you have with your health care provider.   Document Released: 04/23/2005 Document Revised: 10/25/2014 Document Reviewed: 06/08/2013 Elsevier Interactive Patient Education [employer].

## 2015-09-09 NOTE — Op Note (Signed)
Todd Kelly, CHASTEEN NO.:  [id number]  MEDICAL RECORD NO.:  [id number]  LOCATION:  [facility]                         FACILITY:  [facility]  PHYSICIAN:  Lanelle Bal, MD    DATE OF BIRTH:  May 01, 1931  DATE OF PROCEDURE:  09/09/2015 DATE OF DISCHARGE:                              OPERATIVE REPORT   PREOPERATIVE DIAGNOSIS:  Large left lung mass.  POSTOPERATIVE DIAGNOSIS:  Large left lung mass, probable non-small cell lung cancer by preliminary cytology.  PROCEDURE PERFORMED:  Bronchoscopy with bronchial biopsy left upper lobe and brushings left upper lobe.  Also, EBUS with transbronchial biopsy of #7 and #10L lymph nodes.  SURGEON:  Lanelle Bal, MD.  BRIEF HISTORY:  The patient is an 81 year old male with known coronary disease and underlying pulmonary disease who presented with pneumonia type symptoms.  Followup chest x-ray and CT scan confirmed a large left hilar mass and a second peripheral left upper lobe mass.  CT of the head showed no evidence of metastasis on this chest CT.  It appeared that tumor is invading the mediastinum.  It was recommended to the patient that we proceed with bronchoscopy, EBUS to fully stage and obtain tissue for diagnosis and possible genetic testing.  Risks and options were discussed with the patient.  He was on Xarelto preoperatively, this was discontinued 5 days preop.  Risks and options were discussed with the patient and his family in detail and he is agreeable with proceeding.  DESCRIPTION OF PROCEDURE:  The patient underwent general endotracheal anesthesia.  Appropriate time-out was performed.  We then proceeded with fiberoptic bronchoscopy with a videobronchoscope.  The right tracheobronchial tree appeared normal to the subsegmental level. Examination of left tracheobronchial tree showed tumor mass in the left upper lobe bronchus primarily in orifice of lingula. We then removed the standard scope and placed an EBUS scope  and positioned it over #7 nodes and 2 transbronchial biopsies were obtained and sent for cytology.  The scope was then moved into the left bronchus and 10L lymph nodes were identified with the scope and in a similar fashion, transbronchial biopsy was performed.  We got very little tissue on these pass as it appeared that the area was very firm. The scope was then removed.  Brushings of the left upper lobe were performed as were biopsies from the quick cytology.  There was little cellular material on the 10L node.  The #7 node was nondiagnostic.  It was consistent with non-small cell cancer on the cytology of the brushings from the left upper lobe.  The remaining fragments of the biopsy that were obtained of the left upper lobe mass were submitted for permanent evaluation and also for genetic testing as appropriate.  The patient tolerated the procedure without obvious complication.  Minimal bleeding.  He was extubated at the completion of the case and was transferred to the recovery room for further postoperative care.     Lanelle Bal, MD     EG/MEDQ  D:  09/09/2015  T:  09/09/2015  Job:  [id number]

## 2015-09-09 NOTE — H&P (Signed)
[address]       [place] [address]             [phone]                    Todd Kelly [facility] Medical Record #[id number] Date of Birth: 10/28/1930  Referring: Curt Bears, MD Primary Care: Jonathon Bellows, MD  Chief Complaint:   Lung MASS   History of Present Illness:    Todd Kelly 81 y.o. male is seen in the office  today for central left upper lobe mass .the last 3 weeks he has been complaining of increasing shortness breath and cough productive of brownish sputum. He also has a weight loss of around 84 pounds in the last 3 years.After recent hospitalization for SOB and "pneuminia"   He  was seen by his primary care physician Dr. Justin Mend. She ordered chest x-ray which was performed on 08/26/2015 and it showed persistent left upper lobe changes. This was followed by CT scan of the chest with contrast on 08/27/2015 and it showed central left upper lobe lung mass, obstructing the left upper lobe bronchus. This measures 5.4 x 6.6 cm. The central left upper lobe mass invades the mediastinum and is intimately associated with the left pulmonary artery. An adjacent 9 mm AP window node is suspicious based on location. Left infrahilar node measures 12 mm. There was also a spiculated subpleural left upper lobe pulmonary nodule measuring 1.9 x 2.1 cm. There was a subtle irregularity involving the right hepatic capsule. The patient  complains of generalized weakness and fatigue in addition to shortness breath at baseline and increased with exertion and cough productive of clear light brownish sputum. He also has mild headache but attributed  to sinus problem. He also has some blurry vision. He denied having any significant nausea or vomiting   He has  past medical history significant for COPD, hypertension, obstructive sleep apnea, congestive heart failure, coronary artery disease, chronic back pain as well as long history of smoking but quit 20 years ago. I did  CABG on him 2005.  He lost around 84 pounds in the last 3 years, some of it was secondary to aggressive diuresis for his congestive heart failure.   He has a history of smoking more than one pack per day for around 50 years but quit 20 years ago. He has no history of alcohol or drug abuse.   The patient was referred today for presentation in the multidisciplinary conference. Radiology studies and pathology slides were presented there for review and discussion of treatment options. A consensus was discussed regarding potential next steps.  Currently patient on xarleto for Afib, he has no history of stroke or embolic events    Current Activity/ Functional Status:  Patient is not independent with mobility/ambulation, transfers, ADL's, IADL's.   Zubrod Score: At the time of surgery this patient's most appropriate activity status/level should be described as: '[]'$     0    Normal activity, no symptoms '[]'$     1    Restricted in physical strenuous activity but ambulatory, able to do out light work '[x]'$     2    Ambulatory and capable of self care, unable to do work activities, up and about               >50 % of waking hours                              '[]'$   3    Only limited self care, in bed greater than 50% of waking hours '[]'$     4    Completely disabled, no self care, confined to bed or chair '[]'$     5    Moribund   Past Medical History  Diagnosis Date  . OSA (obstructive sleep apnea)   . Bronchial pneumonia   . Bronchitis, chronic with acute exacerbation ([place])   . COPD (chronic obstructive pulmonary disease) ([place])   . Hypertension   . Arteriosclerotic heart disease   . Atrial fibrillation ([place])   . Cardiac pacemaker in situ 04/12/2008    [facility]  . Cerebrovascular disease   . Peripheral vascular disease ([place])   . Hypercholesteremia   . Obesity   . Diverticulosis of colon   . Adenomatous polyp of colon 08/1994  . Hemorrhoids   . DJD (degenerative joint disease)   . Chronic  low back pain   . Anxiety   . CHF (congestive heart failure) ([place])   . CHB (complete heart block) ([place])   . Carotid arterial disease ([place])   . CAD (coronary artery disease)   . S/P CABG (coronary artery bypass graft) 2005  . Shortness of breath dyspnea   . HOH (hard of hearing)   . Headache     sinus  . Presence of permanent cardiac pacemaker     Past Surgical History  Procedure Laterality Date  . Pta to right common femoral artery  2001    Dr. Lyndel Safe  . Carotid endarterectomy  2002    bilateral.  Dr. Kellie Simmering  . Coronary artery bypass graft  2005  . Pacemaker placement  July 2009    for CHB  Dr. Gwenlyn Found. [employer]  . Appendectomy  1948  . US echocardiography  11/06/2011    Mod. LVH w/mod. depressed systolic function,EF 70-48%,GQBVQ LV hypokinesis,mildly dilated LA,mild aortic root dilatation  . Nm myoview ltd  09/04/2010    mild perfusion defect basal inferior,mid inferior, & apical inferior regions. LV systolic function deteriorated since 2006  . Cardiac catheterization  02/02/2008    patent grafts  . Adenoidectomy    . Eye surgery Bilateral     Cataract  CABG in 2005 by me  Family History  Problem Relation Age of Onset  . Asthma Paternal Uncle   . Heart disease Paternal Grandmother   . Heart disease Mother   . Heart attack Father   . Rheum arthritis Father   . Rheum arthritis Paternal Uncle   . Pancreatic cancer Mother   . Hyperlipidemia Mother   Family history significant for a mother with pancreatic cancer and father had heart disease.  Social History   Social History  . Marital Status: Married    Spouse Name: Enid Derry  . Number of Children: N  . Years of Education: N/A   Occupational History  . [employer]     retired  . DRIVER    Social History Main Topics  . Smoking status: Former Smoker -- 1.50 packs/day for 54 years    Types: Cigarettes    Quit date: 10/18/1996  . Smokeless tobacco: Former Systems developer    Types: Chew     Comment: only used chewing  tobacco while working in the yard a couple of times  . Alcohol Use: No  . Drug Use: No  . Sexual Activity: Not on file   Other Topics Concern  . Not on file   Social History Narrative   No alcohol.  Drinks 1 cup of caffeine.    History  Smoking status  . Former Smoker -- 1.50 packs/day for 54 years  . Types: Cigarettes  . Quit date: 10/18/1996  Smokeless tobacco  . Former Systems developer  . Types: Chew    Comment: only used chewing tobacco while working in the yard a couple of times    History  Alcohol Use No   The patient is married and has no biologic children but stepchildren. He was accompanied today by his wife Enid Derry. He used to work in an Facilities manager.  Allergies  Allergen Reactions  . Penicillins Anaphylaxis and Hives    Has patient had a PCN reaction causing immediate rash, facial/tongue/throat swelling, SOB or lightheadedness with hypotension: Yes Has patient had a PCN reaction causing severe rash involving mucus membranes or skin necrosis: No Has patient had a PCN reaction that required hospitalization Yes Has patient had a PCN reaction occurring within the last 10 years: No If all of the above answers are "NO", then may proceed with Cephalosporin use.    Current Facility-Administered Medications  Medication Dose Route Frequency Provider Last Rate Last Dose  . 0.9 % irrigation (POUR BTL)    PRN Grace Isaac, MD   1,000 mL at 09/09/15 0710   Facility-Administered Medications Ordered in Other Encounters  Medication Dose Route Frequency Provider Last Rate Last Dose  . 0.9 %  sodium chloride infusion    Continuous PRN Durene Romans, CRNA      . lactated ringers infusion    Continuous PRN Barrington Ellison, CRNA          Review of Systems:     Cardiac Review of Systems: Y or N  Chest Pain [ n   ]  Resting SOB [ n  ] Exertional SOB  Blue.Reese  ]  Orthopnea [ n ]   Pedal Edema Blue.Reese   ]    Palpitations Blue.Reese  ] Syncope  Florencio.Farrier  ]   Presyncope [ n  ]  General Review of  Systems: [Y] = yes [  ]=no Constitional: recent weight change [  ];  Wt loss over the last 3 months [   ] anorexia [  ]; fatigue [  ]; nausea [  ]; night sweats [  ]; fever [  ]; or chills [  ];          Dental: poor dentition[  ]; Last Dentist visit:   Eye : blurred vision [  ]; diplopia [   ]; vision changes [  ];  Amaurosis fugax[  ]; Resp: cough [  ];  wheezing[  ];  hemoptysis[  ]; shortness of breath[  ]; paroxysmal nocturnal dyspnea[  ]; dyspnea on exertion[  ]; or orthopnea[  ];  GI:  gallstones[  ], vomiting[  ];  dysphagia[  ]; melena[  ];  hematochezia [  ]; heartburn[  ];   Hx of  Colonoscopy[  ]; GU: kidney stones [  ]; hematuria[  ];   dysuria [  ];  nocturia[  ];  history of     obstruction [  ]; urinary frequency [  ]             Skin: rash, swelling[  ];, hair loss[  ];  peripheral edema[  ];  or itching[  ]; Musculosketetal: myalgias[  ];  joint swelling[  ];  joint erythema[  ];  joint pain[  ];  back pain[  ];  Heme/Lymph: bruising[  ];  bleeding[  ];  anemia[  ];  Neuro: TIA[  ];  headaches[  ];  stroke[  ];  vertigo[  ];  seizures[  ];   paresthesias[  ];  difficulty walking[  ];  Psych:depression[  ]; anxiety[  ];  Endocrine: diabetes[  ];  thyroid dysfunction[  ];  Immunizations: Flu up to date [  ]; Pneumococcal up to date [  ];  Other:  Physical Exam: BP 162/58 mmHg  Pulse 60  Temp(Src) 97.8 F (36.6 C) (Oral)  Resp 16  SpO2 99%  PHYSICAL EXAMINATION: General appearance: alert, cooperative, no distress and moderately obese Head: Normocephalic, without obvious abnormality, atraumatic Neck: no adenopathy, no carotid bruit, no JVD, supple, symmetrical, trachea midline and thyroid not enlarged, symmetric, no tenderness/mass/nodules Lymph nodes: Cervical, supraclavicular, and axillary nodes normal. Resp: diminished breath sounds bibasilar Back: symmetric, no curvature. ROM normal. No CVA tenderness. Cardio: irregularly irregular rhythm Genitalia: defer  exam Extremities: sifnificant bruising both arms  Neurologic: Grossly normal  Diagnostic Studies & Laboratory data:     Recent Radiology Findings:   Dg Chest 2 View  09/08/2015  CLINICAL DATA:  Lung mass.  Preoperative bronchoscopy EXAM: CHEST  2 VIEW COMPARISON:  Chest CT August 27, 2015; chest radiograph August 26, 2015 FINDINGS: There is a mass again noted in the left upper lobe region measuring 5.1 x 5.1 cm. A pacemaker obscures the smaller more peripheral left upper lobe mass seen on recent CT. There is no edema or consolidation. Heart is upper normal in size with pulmonary vascularity within normal limits. There is no appreciable adenopathy by radiography. Pacemaker leads are attached to the right atrium right ventricle. Patient is status post coronary artery bypass grafting. No blastic or lytic bone lesions appreciable. IMPRESSION: Dominant left upper lobe mass. Smaller left upper lobe mass likely obscured by pacemaker. No frank edema or consolidation. No new opacity. No change in cardiac silhouette. Electronically Signed   By: Lowella Grip III M.D.   On: 09/08/2015 13:42   Dg Chest 2 View  08/26/2015  CLINICAL DATA:  Followup pneumonia EXAM: CHEST - 2 VIEW COMPARISON:  08/19/2015 FINDINGS: Cardiac shadow remains enlarged. A pacing device is again seen. The lungs are again well aerated. There is a persistent left suprahilar density extending into the left upper lobe. Given the persistent nature the possibility of a central mass with peripheral consolidation deserves consideration. No new focal infiltrate is seen. No bony abnormality is noted. IMPRESSION: Persistent left upper lobe changes. CT with contrast is recommended for further evaluation to rule out an underlying mass lesion causing the upper lobe consolidation. Electronically Signed   By: Inez Catalina M.D.   On: 08/26/2015 12:32   Dg Chest 2 View  08/16/2015  CLINICAL DATA:  Increased shortness breath for 1 week. History of COPD  and CHF. EXAM: CHEST  2 VIEW COMPARISON:  Chest x-rays dated 11/08/2013 and 08/09/2013. FINDINGS: Mild cardiomegaly is unchanged. There is central pulmonary vascular congestion which appears similar to previous exams. There is now probable mild bilateral interstitial edema. Left chest wall pacemaker/AICD is unchanged in position. Median sternotomy wires appear intact and stable in alignment. New confluent airspace opacity is seen within the inferior segment of the left upper lobe extending towards the left hilum. Additional ill-defined opacity at the right lung base. IMPRESSION: 1. Cardiomegaly with central pulmonary vascular congestion and mild bilateral interstitial edema suggesting mild CHF/volume overload. 2. New confluent airspace opacity within the inferior segment  of the left upper lobe, extending towards the left hilum, which could represent pneumonia or asymmetric pulmonary edema. Favor pneumonia. 3. Additional opacity at the right lung base, more likely atelectasis and/or small effusion, but an additional basilar pneumonia cannot be excluded. These results were called by telephone at the time of interpretation on 08/16/2015 at 6:14 pm to Dr. Janne Napoleon , who verbally acknowledged these results. Electronically Signed   By: Franki Cabot M.D.   On: 08/16/2015 18:08   Dg Ribs Bilateral W/chest  08/19/2015  CLINICAL DATA:  81 year old with injury to the anterior lower left ribs 2 days ago. Initial encounter. Personal history of bilateral rib fractures. EXAM: BILATERAL RIBS AND CHEST - 4+ VIEW 1819 hr: COMPARISON:  Chest x-rays earlier same date 1139 hr and previously. No prior rib imaging. FINDINGS: Site of maximum pain and tenderness was marked with a metallic BB. No acute rib fracture identified. Old healed fractures involving the left lateral 8th rib and the right lateral 5th, 6th, 7th and 8th ribs and the right anterior 9th rib. Prior sternotomy for CABG. Cardiac silhouette markedly enlarged, unchanged.  Further improvement in aeration in the right lower lobe since earlier today, with mild atelectasis persisting. Streaky and patchy airspace opacity in the left upper lobe, unchanged. No new pulmonary parenchymal abnormalities. Mild pulmonary venous hypertension without overt edema. Left subclavian dual lead transvenous pacemaker unchanged and intact. IMPRESSION: 1. No acute rib fractures identified. 2. Old healed bilateral rib fractures as detailed above. 3. Further improvement in aeration in the right lung base with mild atelectasis persisting. 4. Stable left upper lobe atelectasis and/or bronchopneumonia. 5. No new abnormalities. Electronically Signed   By: Evangeline Dakin M.D.   On: 08/19/2015 18:38   Ct Head W Wo Contrast  09/08/2015  CLINICAL DATA:  81 year old male with new diagnosis of lung cancer. Weakness. Weight loss. Staging. Initial encounter. EXAM: CT HEAD WITHOUT AND WITH CONTRAST TECHNIQUE: Contiguous axial images were obtained from the base of the skull through the vertex without and with intravenous contrast CONTRAST:  37m OMNIPAQUE IOHEXOL 300 MG/ML  SOLN COMPARISON:  05/05/2008 CT angiogram. FINDINGS: No intracranial mass or bony destructive lesion. Global atrophy without hydrocephalus. Small vessel disease type changes without CT evidence of large acute infarct. No intracranial hemorrhage. Vascular calcifications. Poor delineation full extent of the right vertebral artery. IMPRESSION: No CT evidence of intracranial metastatic disease. Global atrophy. Small vessel disease type changes. Vascular calcifications. Full extent of the right vertebral artery not visualized. Electronically Signed   By: SGenia DelM.D.   On: 09/08/2015 16:40   Ct Chest W Contrast  08/27/2015  CLINICAL DATA:  Recent pneumonia. Cough. Short of breath and weight loss. Ex-smoker. EXAM: CT CHEST WITH CONTRAST TECHNIQUE: Multidetector CT imaging of the chest was performed during intravenous contrast administration.  CONTRAST:  776mISOVUE-300 IOPAMIDOL (ISOVUE-300) INJECTION 61% COMPARISON:  Chest radiographs, most recent 1 day prior. No prior CT. FINDINGS: Mediastinum/Nodes: Aortic and branch vessel atherosclerosis. Ulcerative plaque throughout the descending thoracic aorta. Mild cardiomegaly with pacer in place. No pericardial effusion. Pulmonary artery enlargement, outflow tract 3.3 cm. The central left upper lobe mass invades the mediastinum and is intimately associated with the left pulmonary artery. An adjacent 9 mm AP window node is suspicious based on location. Left infrahilar node measures 12 mm (image 26,, series 3), suspicious. Lungs/Pleura: Small right greater than left pleural effusions. Minimal patchy right upper lobe opacities are favored to represent areas of subsegmental atelectasis. Spiculated subpleural left upper  lobe pulmonary nodule measures 1.9 x 2.1 cm (image 21, series 4). Central left upper lobe lung mass, obstructing the left upper lobe bronchus. This measures 5.4 x 6.6 cm (image 23, series 4). Upper abdomen: Apparent subtle irregularity involving the right lobe of the liver at approximately 9 mm (image 63, series 3). Normal imaged portions of the spleen, stomach, pancreas, right adrenal gland. Mild left adrenal thickening and nodularity. On the order of 1.6 cm. An upper pole left renal lesion is most consistent with a cyst or minimally complex cyst at approximately 9 mm. Abdominal aortic atherosclerosis. Musculoskeletal: Probable sebaceous cyst superficial to the spinous process at 1.6 cm (image 15, series 3). lateral right rib remote trauma. IMPRESSION: 1. Central left upper lobe lung mass, consistent with primary bronchogenic carcinoma. This has direct mediastinal invasion with left mediastinal and infrahilar adenopathy, suspicious for nodal metastasis. 2. A more peripheral left upper lobe spiculated nodule is suspicious for a synchronous primary bronchogenic carcinoma. 3. Small bilateral pleural  effusions. 4. Subtle irregularity involving the right hepatic capsule. Indeterminate. 5. Left adrenal nodularity, also indeterminate. Consider PET with attention to these 2 areas. 6. Pulmonary artery enlargement suggests pulmonary arterial hypertension. These results will be called to the ordering clinician or representative by the Radiologist Assistant, and communication documented in the PACS or zVision Dashboard. Electronically Signed   By: Abigail Miyamoto M.D.   On: 08/27/2015 09:30   Dg Chest Port 1 View  08/19/2015  CLINICAL DATA:  81 year old male with left chest pain. Rib fractures. EXAM: PORTABLE CHEST 1 VIEW COMPARISON:  08/16/2015 and prior chest radiographs FINDINGS: Cardiomegaly, CABG changes and left-sided pacemaker again noted. Focal left upper lobe opacity/atelectasis/ airspace disease is unchanged. Improved right basilar aeration noted with mild right basilar atelectasis present. There is no evidence of pneumothorax. There is a trace right pleural effusion present. Fractures of the right fifth through eighth ribs are noted. IMPRESSION: Improved right basilar aeration without other significant change. Continued left upper lobe opacity which may represent atelectasis or airspace disease. Fractures of the right fifth through eighth ribs. Trace right pleural effusion. Electronically Signed   By: Margarette Canada M.D.   On: 08/19/2015 13:28     I have independently reviewed the above radiologic studies.  Recent Lab Findings: Lab Results  Component Value Date   WBC 9.4 09/08/2015   HGB 12.8* 09/08/2015   HCT 40.6 09/08/2015   PLT 155 09/08/2015   GLUCOSE 102* 09/08/2015   CHOL 108 11/02/2013   TRIG 70.0 11/02/2013   HDL 39.30 11/02/2013   LDLDIRECT 60.7 10/07/2009   LDLCALC 55 11/02/2013   ALT 11* 09/08/2015   AST 13* 09/08/2015   NA 145 09/08/2015   K 4.1 09/08/2015   CL 109 09/08/2015   CREATININE 1.52* 09/08/2015   BUN 27* 09/08/2015   CO2 29 09/08/2015   TSH 1.724 08/17/2015    INR 1.14 09/08/2015   HGBA1C 6.5 10/05/2011   T Assessment / Plan:    82 years old white male with questionably stage III/IV lung cancer highly suspicious for non-small cell carcinoma, presented with central left upper lobe mass with direct invasion of the mediastinum as well as mediastinal and hilar lymphadenopathy as well as questionable right pleural effusion and small liver lesion. Needs tissue dx Has been on xarleto hold 5 days due to age, procedure with high risk if  bleeding occurs and decreased renal function Have discussed with patient proceeding   bronchoscopy , EBUS and biopsy with goal of getting  tissue for genomic testing  The goals risks and alternatives of the planned surgical procedure bronchoscopy , EBUS and biopsy have been discussed with the patient in detail. The risks of the procedure including death, infection, stroke, myocardial infarction, bleeding, blood transfusion have all been discussed specifically.  I have quoted [name] a 1 % of perioperative mortality and a complication rate as high as 25 %. The patient's questions have been answered.[name] is willing  to proceed with the planned procedure.      Grace Isaac MD      [address] [place] [address] Office [phone]   Beeper [phone]  09/09/2015 7:23 AM

## 2015-09-10 ENCOUNTER — Telehealth: Payer: Self-pay | Admitting: *Deleted

## 2015-09-10 ENCOUNTER — Emergency Department (HOSPITAL_COMMUNITY)
Admission: EM | Admit: 2015-09-10 | Discharge: 2015-09-10 | Disposition: A | Discharge status: Home / Self Care | Payer: Commercial Managed Care - HMO | Attending: Emergency Medicine | Admitting: Emergency Medicine

## 2015-09-10 ENCOUNTER — Telehealth: Payer: Self-pay | Admitting: Internal Medicine

## 2015-09-10 ENCOUNTER — Emergency Department (INDEPENDENT_AMBULATORY_CARE_PROVIDER_SITE_OTHER)
Admission: EM | Admit: 2015-09-10 | Discharge: 2015-09-10 | Disposition: A | Discharge status: Home / Self Care | Payer: Commercial Managed Care - HMO | Source: Home / Self Care

## 2015-09-10 ENCOUNTER — Encounter (HOSPITAL_COMMUNITY): Payer: Self-pay | Admitting: Emergency Medicine

## 2015-09-10 DIAGNOSIS — N390 Urinary tract infection, site not specified: Secondary | ICD-10-CM | POA: Diagnosis not present

## 2015-09-10 DIAGNOSIS — Z79899 Other long term (current) drug therapy: Secondary | ICD-10-CM | POA: Insufficient documentation

## 2015-09-10 DIAGNOSIS — I509 Heart failure, unspecified: Secondary | ICD-10-CM

## 2015-09-10 DIAGNOSIS — E7800 Pure hypercholesterolemia, unspecified: Secondary | ICD-10-CM | POA: Insufficient documentation

## 2015-09-10 DIAGNOSIS — Z7951 Long term (current) use of inhaled steroids: Secondary | ICD-10-CM | POA: Insufficient documentation

## 2015-09-10 DIAGNOSIS — Z880 Allergy status to penicillin: Secondary | ICD-10-CM | POA: Diagnosis not present

## 2015-09-10 DIAGNOSIS — I10 Essential (primary) hypertension: Secondary | ICD-10-CM | POA: Diagnosis not present

## 2015-09-10 DIAGNOSIS — Z951 Presence of aortocoronary bypass graft: Secondary | ICD-10-CM | POA: Diagnosis not present

## 2015-09-10 DIAGNOSIS — Z86010 Personal history of colonic polyps: Secondary | ICD-10-CM | POA: Diagnosis not present

## 2015-09-10 DIAGNOSIS — Z7901 Long term (current) use of anticoagulants: Secondary | ICD-10-CM | POA: Diagnosis not present

## 2015-09-10 DIAGNOSIS — Z8719 Personal history of other diseases of the digestive system: Secondary | ICD-10-CM | POA: Diagnosis not present

## 2015-09-10 DIAGNOSIS — I4891 Unspecified atrial fibrillation: Secondary | ICD-10-CM | POA: Diagnosis not present

## 2015-09-10 DIAGNOSIS — Z87891 Personal history of nicotine dependence: Secondary | ICD-10-CM | POA: Diagnosis not present

## 2015-09-10 DIAGNOSIS — Z8701 Personal history of pneumonia (recurrent): Secondary | ICD-10-CM | POA: Insufficient documentation

## 2015-09-10 DIAGNOSIS — G8929 Other chronic pain: Secondary | ICD-10-CM | POA: Insufficient documentation

## 2015-09-10 DIAGNOSIS — N320 Bladder-neck obstruction: Secondary | ICD-10-CM | POA: Diagnosis not present

## 2015-09-10 DIAGNOSIS — R918 Other nonspecific abnormal finding of lung field: Secondary | ICD-10-CM | POA: Insufficient documentation

## 2015-09-10 DIAGNOSIS — E669 Obesity, unspecified: Secondary | ICD-10-CM | POA: Insufficient documentation

## 2015-09-10 DIAGNOSIS — I2510 Atherosclerotic heart disease of native coronary artery without angina pectoris: Secondary | ICD-10-CM | POA: Insufficient documentation

## 2015-09-10 DIAGNOSIS — R339 Retention of urine, unspecified: Secondary | ICD-10-CM | POA: Diagnosis present

## 2015-09-10 DIAGNOSIS — Z950 Presence of cardiac pacemaker: Secondary | ICD-10-CM | POA: Diagnosis not present

## 2015-09-10 DIAGNOSIS — F419 Anxiety disorder, unspecified: Secondary | ICD-10-CM | POA: Insufficient documentation

## 2015-09-10 LAB — CBC
HEMATOCRIT: 39.2 % (ref 39.0–52.0)
HEMOGLOBIN: 12.7 g/dL — AB (ref 13.0–17.0)
MCH: 31.2 pg (ref 26.0–34.0)
MCHC: 32.4 g/dL (ref 30.0–36.0)
MCV: 96.3 fL (ref 78.0–100.0)
Platelets: 137 10*3/uL — ABNORMAL LOW (ref 150–400)
RBC: 4.07 MIL/uL — ABNORMAL LOW (ref 4.22–5.81)
RDW: 14.5 % (ref 11.5–15.5)
WBC: 8.4 10*3/uL (ref 4.0–10.5)

## 2015-09-10 LAB — URINALYSIS, ROUTINE W REFLEX MICROSCOPIC
BILIRUBIN URINE: NEGATIVE
GLUCOSE, UA: NEGATIVE mg/dL
Ketones, ur: NEGATIVE mg/dL
Nitrite: NEGATIVE
PH: 5.5 (ref 5.0–8.0)
Protein, ur: NEGATIVE mg/dL
SPECIFIC GRAVITY, URINE: 1.015 (ref 1.005–1.030)

## 2015-09-10 LAB — BASIC METABOLIC PANEL
ANION GAP: 10 (ref 5–15)
BUN: 23 mg/dL — ABNORMAL HIGH (ref 6–20)
CHLORIDE: 106 mmol/L (ref 101–111)
CO2: 25 mmol/L (ref 22–32)
CREATININE: 1.18 mg/dL (ref 0.61–1.24)
Calcium: 8.6 mg/dL — ABNORMAL LOW (ref 8.9–10.3)
GFR calc non Af Amer: 56 mL/min — ABNORMAL LOW (ref >60)
Glucose, Bld: 114 mg/dL — ABNORMAL HIGH (ref 65–99)
POTASSIUM: 4.3 mmol/L (ref 3.5–5.1)
SODIUM: 141 mmol/L (ref 135–145)

## 2015-09-10 LAB — URINE MICROSCOPIC-ADD ON

## 2015-09-10 LAB — BRAIN NATRIURETIC PEPTIDE: B NATRIURETIC PEPTIDE 5: 1063.5 pg/mL — AB (ref 0.0–100.0)

## 2015-09-10 MED ORDER — FUROSEMIDE 10 MG/ML IJ SOLN
60.0000 mg | Freq: Once | INTRAMUSCULAR | Status: AC
  Administered 2015-09-10: 60 mg via INTRAVENOUS
  Filled 2015-09-10: qty 6

## 2015-09-10 MED ORDER — CIPROFLOXACIN HCL 500 MG PO TABS: 500.0000 mg | ORAL_TABLET | Freq: Two times a day (BID) | ORAL | Status: DC

## 2015-09-10 MED ORDER — CIPROFLOXACIN IN D5W 400 MG/200ML IV SOLN
400.0000 mg | Freq: Once | INTRAVENOUS | Status: AC
  Administered 2015-09-10: 400 mg via INTRAVENOUS
  Filled 2015-09-10: qty 200

## 2015-09-10 NOTE — ED Provider Notes (Signed)
CSN: [id number]     Arrival date & time 09/10/15  1814 History   First MD Initiated Contact with Patient 09/10/15 1826     Chief Complaint  Patient presents with  . Urinary Retention      HPI Pt instructed by pcp to come to ER to be evaluated for urinary blockage. Pt has been unable to urinate normally x 2 days. Pt had lung biopsy yesterday. Pt legs have increase in swelling and having sob.  The patient has history of chronic congestive heart failure.  He takes Lasix as needed but has been afraid to take it for his swelling because of his urinary frequency is getting up several times during the night.  Patient denies any fever or chills nausea vomiting.  Patient is currently undergoing workup for an possible lung cancer. Past Medical History  Diagnosis Date  . OSA (obstructive sleep apnea)   . Bronchial pneumonia   . Bronchitis, chronic with acute exacerbation ([place])   . COPD (chronic obstructive pulmonary disease) ([place])   . Hypertension   . Arteriosclerotic heart disease   . Atrial fibrillation ([place])   . Cardiac pacemaker in situ 04/12/2008    [place]  . Cerebrovascular disease   . Peripheral vascular disease ([place])   . Hypercholesteremia   . Obesity   . Diverticulosis of colon   . Adenomatous polyp of colon 08/1994  . Hemorrhoids   . DJD (degenerative joint disease)   . Chronic low back pain   . Anxiety   . CHF (congestive heart failure) ([place])   . CHB (complete heart block) ([place])   . Carotid arterial disease ([place])   . CAD (coronary artery disease)   . S/P CABG (coronary artery bypass graft) 2005  . Shortness of breath dyspnea   . HOH (hard of hearing)   . Headache     sinus  . Presence of permanent cardiac pacemaker    Past Surgical History  Procedure Laterality Date  . Pta to right common femoral artery  2001    Dr. Lyndel Safe  . Carotid endarterectomy  2002    bilateral.  Dr. Kellie Simmering  . Coronary artery bypass graft  2005  . Pacemaker placement  July 2009    for  CHB  Dr. Gwenlyn Found. St. Jude  . Appendectomy  1948  . US echocardiography  11/06/2011    Mod. LVH w/mod. depressed systolic function,EF 43-15%,QMGQQ LV hypokinesis,mildly dilated LA,mild aortic root dilatation  . Nm myoview ltd  09/04/2010    mild perfusion defect basal inferior,mid inferior, & apical inferior regions. LV systolic function deteriorated since 2006  . Cardiac catheterization  02/02/2008    patent grafts  . Adenoidectomy    . Eye surgery Bilateral     Cataract   Family History  Problem Relation Age of Onset  . Asthma Paternal Uncle   . Heart disease Paternal Grandmother   . Heart disease Mother   . Heart attack Father   . Rheum arthritis Father   . Rheum arthritis Paternal Uncle   . Pancreatic cancer Mother   . Hyperlipidemia Mother    Social History  Substance Use Topics  . Smoking status: Former Smoker -- 1.50 packs/day for 54 years    Types: Cigarettes    Quit date: 10/18/1996  . Smokeless tobacco: Former Systems developer    Types: Chew     Comment: only used chewing tobacco while working in the yard a couple of times  . Alcohol Use: No    Review  of Systems  Constitutional: Negative for fever.  Genitourinary: Positive for dysuria. Negative for hematuria and penile pain.  All other systems reviewed and are negative.     Allergies  Penicillins  Home Medications   Prior to Admission medications   Medication Sig Start Date End Date Taking? Authorizing Provider  ALPRAZolam Duanne Moron) 0.5 MG tablet Take 1 tablet (0.5 mg total) by mouth 3 (three) times daily as needed for anxiety. Not to exceed 3 per day. Patient taking differently: Take 0.5 mg by mouth 2 (two) times daily. Not to exceed 3 per day. 11/08/13   Noralee Space, MD  amiodarone (PACERONE) 200 MG tablet Take 1 tablet (200 mg total) by mouth daily. 01/14/15   Mihai Croitoru, MD  atorvastatin (LIPITOR) 20 MG tablet Take 1 tablet (20 mg total) by mouth daily. 07/15/15   Mihai Croitoru, MD  ciprofloxacin (CIPRO) 500 MG  tablet Take 1 tablet (500 mg total) by mouth 2 (two) times daily. 09/10/15   Leonard Schwartz, MD  Coenzyme Q10 (COQ10) 100 MG CAPS Take 100 mg by mouth daily.     Historical Provider, MD  Dextromethorphan-Guaifenesin (MUCINEX DM) 30-600 MG TB12 Take 1 tablet by mouth 2 (two) times daily.    Historical Provider, MD  docusate sodium (COLACE) 250 MG capsule Take 250 mg by mouth daily after supper.    Historical Provider, MD  ferrous sulfate 325 (65 FE) MG tablet Take 325 mg by mouth daily after supper.     Historical Provider, MD  fluticasone (FLONASE) 50 MCG/ACT nasal spray USE ONE SPRAY IN EACH NOSTRIL TWICE A DAY Patient taking differently: USE ONE SPRAY IN EACH NOSTRIL TWICE A DAY AS NEEDED FOR CONGESTION 09/22/14   Noralee Space, MD  furosemide (LASIX) 40 MG tablet Take 1 tablet (40 mg total) by mouth daily. Patient taking differently: Take 40 mg by mouth daily as needed (weight of 186 lb or more).  01/14/15   Mihai Croitoru, MD  HYDROcodone-acetaminophen (NORCO/VICODIN) 5-325 MG per tablet Take 1/2 to 1 tablet by mouth three times daily as needed for pain Patient taking differently: Take 0.5-1 tablets by mouth 3 (three) times daily as needed (pain).  01/31/14   Noralee Space, MD  losartan (COZAAR) 100 MG tablet Take 1 tablet (100 mg total) by mouth at bedtime. Patient taking differently: Take 100 mg by mouth daily after supper.  04/16/15   Mihai Croitoru, MD  meclizine (ANTIVERT) 25 MG tablet Take 1/2 to 1 tablet by mouth every 6 hours as needed for dizziness Patient taking differently: Take 25 mg by mouth 2 (two) times daily as needed for dizziness.  11/08/13   Noralee Space, MD  metoprolol tartrate (LOPRESSOR) 25 MG tablet Take 1 tablet (25 mg total) by mouth 2 (two) times daily. Patient taking differently: Take 12.5-25 mg by mouth 2 (two) times daily. Take 1/2 tablet (12.5 mg)by mouth in the morning and 1 tablet (25 mg) in the evening. 01/14/15   Mihai Croitoru, MD  montelukast (SINGULAIR) 10 MG  tablet TAKE 1 TABLET AT BEDTIME Patient taking differently: TAKE 1 TABLET BY MOUTH DAILY AFTER SUPPER 08/06/15   Noralee Space, MD  Omega-3 Fatty Acids (FISH OIL) 1000 MG CAPS Take 1,000 mg by mouth daily.     Historical Provider, MD  Polyethyl Glycol-Propyl Glycol (SYSTANE OP) Place 1 drop into both eyes 4 (four) times daily.    Historical Provider, MD  polyethylene glycol (MIRALAX / GLYCOLAX) packet Take 17 g by mouth daily  as needed (constipation).    Historical Provider, MD  potassium chloride (K-DUR) 10 MEQ tablet Take 20 mEq by mouth daily as needed (if taking lasix).    Historical Provider, MD  predniSONE (DELTASONE) 5 MG tablet Take 5 mg by mouth daily.  03/20/13   Noralee Space, MD  ranitidine (ZANTAC) 150 MG tablet Take 1 tablet (150 mg total) by mouth 2 (two) times daily. 05/07/15   Mihai Croitoru, MD  rivaroxaban (XARELTO) 20 MG TABS tablet Take 1 tablet (20 mg total) by mouth daily with supper. Patient taking differently: Take 20 mg by mouth daily after supper.  01/14/15   Sanda Klein, MD  SYMBICORT 160-4.5 MCG/ACT inhaler USE 2 INHALATIONS TWICE A DAY 08/07/15   Noralee Space, MD  tiotropium (SPIRIVA HANDIHALER) 18 MCG inhalation capsule INHALE THE CONTENTS OF 1 CAPSULE DAILY Patient taking differently: Place 18 mcg into inhaler and inhale daily after supper. INHALE THE CONTENTS OF 1 CAPSULE DAILY 11/08/13   Noralee Space, MD  vitamin B-12 (CYANOCOBALAMIN) 500 MCG tablet Take 500 mcg by mouth daily after supper.     Historical Provider, MD  vitamin C (ASCORBIC ACID) 500 MG tablet Take 500 mg by mouth daily after supper.     Historical Provider, MD   BP 137/94 mmHg  Pulse 59  Temp(Src) 98.1 F (36.7 C) (Oral)  Resp 21  Ht '5\' 8"'$  (1.727 m)  Wt 192 lb 6.4 oz (87.272 kg)  BMI 29.26 kg/m2  SpO2 98% Physical Exam  Constitutional: He is oriented to person, place, and time. He appears well-developed and well-nourished. No distress.  HENT:  Head: Normocephalic and atraumatic.  Eyes:  Pupils are equal, round, and reactive to light.  Neck: Normal range of motion.  Cardiovascular: Normal rate and intact distal pulses.   Pulmonary/Chest: Effort normal. No respiratory distress. He has no wheezes.  Abdominal: Normal appearance. He exhibits no distension. There is no tenderness. There is no rebound.  Musculoskeletal: Normal range of motion. He exhibits edema (1-2+ pitting around ankles bilaterally).  Neurological: He is alert and oriented to person, place, and time. No cranial nerve deficit.  Skin: Skin is warm and dry. No rash noted.  Psychiatric: He has a normal mood and affect. His behavior is normal.  Nursing note and vitals reviewed.   ED Course  Procedures (including critical care time) Medications  ciprofloxacin (CIPRO) IVPB 400 mg (400 mg Intravenous New Bag/Given 09/10/15 1946)  furosemide (LASIX) injection 60 mg (not administered)    Labs Review Labs Reviewed  URINALYSIS, ROUTINE W REFLEX MICROSCOPIC (NOT AT [facility]) - Abnormal; Notable for the following:    APPearance CLOUDY (*)    Hgb urine dipstick MODERATE (*)    Leukocytes, UA LARGE (*)    All other components within normal limits  BRAIN NATRIURETIC PEPTIDE - Abnormal; Notable for the following:    B Natriuretic Peptide 1063.5 (*)    All other components within normal limits  CBC - Abnormal; Notable for the following:    RBC 4.07 (*)    Hemoglobin 12.7 (*)    Platelets 137 (*)    All other components within normal limits  BASIC METABOLIC PANEL - Abnormal; Notable for the following:    Glucose, Bld 114 (*)    BUN 23 (*)    Calcium 8.6 (*)    GFR calc non Af Amer 56 (*)    All other components within normal limits  URINE MICROSCOPIC-ADD ON - Abnormal; Notable for the following:  Squamous Epithelial / LPF 0-5 (*)    Bacteria, UA MANY (*)    All other components within normal limits  URINE CULTURE    Imaging Review No results found. I have personally reviewed and evaluated these images and lab  results as part of my medical decision-making.   EKG Interpretation   Date/Time:  Wednesday September 10 2015 18:34:17 EST Ventricular Rate:  60 PR Interval:  185 QRS Duration: 182 QT Interval:  504 QTC Calculation: 504 R Axis:   -81 Text Interpretation:  Sinus rhythm Left bundle branch block Inferior  infarct, acute (RCA) Probable RV involvement, suggest recording right  precordial leads No significant change since last tracing Confirmed by  Jailyn Langhorst  MD, Saabir Blyth ([id number]) on 09/10/2015 6:45:21 PM     After placement of indwelling Foley catheter approximately 600 cc of cloudy urine was returned.  Foley will be left in place until Monday when he he can follow-up his primary care doctor.  He was given intravenous Cipro and will be started on by mouth Cipro.  Patient has history of chronic CHF and his sats on room air were good throughout his stay in the ED. MDM   Final diagnoses:  UTI (lower urinary tract infection)  Chronic congestive heart failure, unspecified congestive heart failure type [facility])  Lung mass        Leonard Schwartz, MD 09/10/15 2008

## 2015-09-10 NOTE — ED Notes (Signed)
Patient and spouse reports urinary retention that started last night.  Bilateral ankle/foot swelling.  Patient and spouse reports urine "dribbles".  Patient has had 2 lasix recently

## 2015-09-10 NOTE — ED Provider Notes (Signed)
CSN: [id number]     Arrival date & time 09/10/15  1641 History   None    Chief Complaint  Patient presents with  . Urinary Retention   (Consider location/radiation/quality/duration/timing/severity/associated sxs/prior Treatment) Patient is a 81 y.o. male presenting with frequency. The history is provided by the patient and the spouse.  Urinary Frequency This is a new problem. The current episode started yesterday (unable to void even after 2 lasix tabs today.). The problem has been gradually worsening. Associated symptoms include abdominal pain.    Past Medical History  Diagnosis Date  . OSA (obstructive sleep apnea)   . Bronchial pneumonia   . Bronchitis, chronic with acute exacerbation ([place])   . COPD (chronic obstructive pulmonary disease) ([place])   . Hypertension   . Arteriosclerotic heart disease   . Atrial fibrillation ([place])   . Cardiac pacemaker in situ 04/12/2008    [place]  . Cerebrovascular disease   . Peripheral vascular disease ([place])   . Hypercholesteremia   . Obesity   . Diverticulosis of colon   . Adenomatous polyp of colon 08/1994  . Hemorrhoids   . DJD (degenerative joint disease)   . Chronic low back pain   . Anxiety   . CHF (congestive heart failure) ([place])   . CHB (complete heart block) ([place])   . Carotid arterial disease ([place])   . CAD (coronary artery disease)   . S/P CABG (coronary artery bypass graft) 2005  . Shortness of breath dyspnea   . HOH (hard of hearing)   . Headache     sinus  . Presence of permanent cardiac pacemaker    Past Surgical History  Procedure Laterality Date  . Pta to right common femoral artery  2001    Dr. Lyndel Safe  . Carotid endarterectomy  2002    bilateral.  Dr. Kellie Simmering  . Coronary artery bypass graft  2005  . Pacemaker placement  July 2009    for CHB  Dr. Gwenlyn Found. St. Jude  . Appendectomy  1948  . US echocardiography  11/06/2011    Mod. LVH w/mod. depressed systolic function,EF 94-85%,IOEVO LV hypokinesis,mildly  dilated LA,mild aortic root dilatation  . Nm myoview ltd  09/04/2010    mild perfusion defect basal inferior,mid inferior, & apical inferior regions. LV systolic function deteriorated since 2006  . Cardiac catheterization  02/02/2008    patent grafts  . Adenoidectomy    . Eye surgery Bilateral     Cataract   Family History  Problem Relation Age of Onset  . Asthma Paternal Uncle   . Heart disease Paternal Grandmother   . Heart disease Mother   . Heart attack Father   . Rheum arthritis Father   . Rheum arthritis Paternal Uncle   . Pancreatic cancer Mother   . Hyperlipidemia Mother    Social History  Substance Use Topics  . Smoking status: Former Smoker -- 1.50 packs/day for 54 years    Types: Cigarettes    Quit date: 10/18/1996  . Smokeless tobacco: Former Systems developer    Types: Chew     Comment: only used chewing tobacco while working in the yard a couple of times  . Alcohol Use: No    Review of Systems  Constitutional: Negative.   Gastrointestinal: Positive for abdominal pain.  Genitourinary: Positive for difficulty urinating. Negative for frequency.  Musculoskeletal: Negative.   All other systems reviewed and are negative.   Allergies  Penicillins  Home Medications   Prior to Admission medications   Medication  Sig Start Date End Date Taking? Authorizing Provider  ALPRAZolam Duanne Moron) 0.5 MG tablet Take 1 tablet (0.5 mg total) by mouth 3 (three) times daily as needed for anxiety. Not to exceed 3 per day. Patient taking differently: Take 0.5 mg by mouth 2 (two) times daily. Not to exceed 3 per day. 11/08/13   Noralee Space, MD  amiodarone (PACERONE) 200 MG tablet Take 1 tablet (200 mg total) by mouth daily. 01/14/15   Mihai Croitoru, MD  atorvastatin (LIPITOR) 20 MG tablet Take 1 tablet (20 mg total) by mouth daily. 07/15/15   Mihai Croitoru, MD  Coenzyme Q10 (COQ10) 100 MG CAPS Take 100 mg by mouth daily.     Historical Provider, MD  Dextromethorphan-Guaifenesin (MUCINEX DM)  30-600 MG TB12 Take 1 tablet by mouth 2 (two) times daily.    Historical Provider, MD  docusate sodium (COLACE) 250 MG capsule Take 250 mg by mouth daily after supper.    Historical Provider, MD  ferrous sulfate 325 (65 FE) MG tablet Take 325 mg by mouth daily after supper.     Historical Provider, MD  fluticasone (FLONASE) 50 MCG/ACT nasal spray USE ONE SPRAY IN EACH NOSTRIL TWICE A DAY Patient taking differently: USE ONE SPRAY IN EACH NOSTRIL TWICE A DAY AS NEEDED FOR CONGESTION 09/22/14   Noralee Space, MD  furosemide (LASIX) 40 MG tablet Take 1 tablet (40 mg total) by mouth daily. Patient taking differently: Take 40 mg by mouth daily as needed (weight of 186 lb or more).  01/14/15   Mihai Croitoru, MD  HYDROcodone-acetaminophen (NORCO/VICODIN) 5-325 MG per tablet Take 1/2 to 1 tablet by mouth three times daily as needed for pain Patient taking differently: Take 0.5-1 tablets by mouth 3 (three) times daily as needed (pain).  01/31/14   Noralee Space, MD  losartan (COZAAR) 100 MG tablet Take 1 tablet (100 mg total) by mouth at bedtime. Patient taking differently: Take 100 mg by mouth daily after supper.  04/16/15   Mihai Croitoru, MD  meclizine (ANTIVERT) 25 MG tablet Take 1/2 to 1 tablet by mouth every 6 hours as needed for dizziness Patient taking differently: Take 25 mg by mouth 2 (two) times daily as needed for dizziness.  11/08/13   Noralee Space, MD  metoprolol tartrate (LOPRESSOR) 25 MG tablet Take 1 tablet (25 mg total) by mouth 2 (two) times daily. Patient taking differently: Take 12.5-25 mg by mouth 2 (two) times daily. Take 1/2 tablet (12.5 mg)by mouth in the morning and 1 tablet (25 mg) in the evening. 01/14/15   Mihai Croitoru, MD  montelukast (SINGULAIR) 10 MG tablet TAKE 1 TABLET AT BEDTIME Patient taking differently: TAKE 1 TABLET BY MOUTH DAILY AFTER SUPPER 08/06/15   Noralee Space, MD  Omega-3 Fatty Acids (FISH OIL) 1000 MG CAPS Take 1,000 mg by mouth daily.     Historical Provider, MD   Polyethyl Glycol-Propyl Glycol (SYSTANE OP) Place 1 drop into both eyes 4 (four) times daily.    Historical Provider, MD  polyethylene glycol (MIRALAX / GLYCOLAX) packet Take 17 g by mouth daily as needed (constipation).    Historical Provider, MD  potassium chloride (K-DUR) 10 MEQ tablet Take 20 mEq by mouth daily as needed (if taking lasix).    Historical Provider, MD  predniSONE (DELTASONE) 5 MG tablet Take 5 mg by mouth daily.  03/20/13   Noralee Space, MD  ranitidine (ZANTAC) 150 MG tablet Take 1 tablet (150 mg total) by mouth 2 (two)  times daily. 05/07/15   Mihai Croitoru, MD  rivaroxaban (XARELTO) 20 MG TABS tablet Take 1 tablet (20 mg total) by mouth daily with supper. Patient taking differently: Take 20 mg by mouth daily after supper.  01/14/15   Sanda Klein, MD  SYMBICORT 160-4.5 MCG/ACT inhaler USE 2 INHALATIONS TWICE A DAY 08/07/15   Noralee Space, MD  tiotropium (SPIRIVA HANDIHALER) 18 MCG inhalation capsule INHALE THE CONTENTS OF 1 CAPSULE DAILY Patient taking differently: Place 18 mcg into inhaler and inhale daily after supper. INHALE THE CONTENTS OF 1 CAPSULE DAILY 11/08/13   Noralee Space, MD  vitamin B-12 (CYANOCOBALAMIN) 500 MCG tablet Take 500 mcg by mouth daily after supper.     Historical Provider, MD  vitamin C (ASCORBIC ACID) 500 MG tablet Take 500 mg by mouth daily after supper.     Historical Provider, MD   Meds Ordered and Administered this Visit  Medications - No data to display  BP 139/75 mmHg  Pulse 94  Temp(Src) 98.7 F (37.1 C) (Oral)  SpO2 94% No data found.   Physical Exam  Constitutional: He is oriented to person, place, and time. He appears well-developed and well-nourished. He appears distressed.  Abdominal: Soft. Normal appearance and bowel sounds are normal. He exhibits no distension and no mass. There is tenderness in the suprapubic area. There is no rebound, no guarding and no CVA tenderness.  Neurological: He is alert and oriented to person, place,  and time.  Skin: Skin is warm and dry.  Nursing note and vitals reviewed.   ED Course  Procedures (including critical care time)  Labs Review Labs Reviewed - No data to display  Imaging Review No results found.   Visual Acuity Review  Right Eye Distance:   Left Eye Distance:   Bilateral Distance:    Right Eye Near:   Left Eye Near:    Bilateral Near:         MDM   1. Bladder outlet obstruction    Sent for bladder cath and urology consult re outlet obstruct.    Billy Fischer, MD 09/10/15 [phone]

## 2015-09-10 NOTE — Telephone Encounter (Signed)
Patient's wife called it saying patients weight was up 2 lbs (@ 191 lbs) and he isn't urinating much at all.  She also says its burning and she thinks he has a UTI.  He does not have a fever.  His PCP called in an antibiotic for him but she wanted to make Dr. Servando Snare aware.  I have let him know and and he is concerned there could possibly be a urinary blockage and he needs to get a bladder scan to have this evaluated.  I have instructed the patient to go to the ER per Dr. Servando Snare but she is going to take him to Urgent Care to see if they can help with this situation.  I have updated Dr. Servando Snare.  Patient is aware to call us back if any surgical issues come up.

## 2015-09-10 NOTE — Discharge Instructions (Signed)

## 2015-09-10 NOTE — Telephone Encounter (Signed)
returned call and s.w. pt wife adn advised on appt....pt ok and aware

## 2015-09-10 NOTE — ED Notes (Signed)
Pt instructed by pcp to come to ER to be evaluated for urinary blockage. Pt has been unable to urinate normally x 2 days. Pt had lung biopsy yesterday. Pt legs have increase in swelling and having sob.

## 2015-09-13 LAB — URINE CULTURE
Culture: 40000
SPECIAL REQUESTS: NORMAL

## 2015-09-14 ENCOUNTER — Telehealth (HOSPITAL_BASED_OUTPATIENT_CLINIC_OR_DEPARTMENT_OTHER): Payer: Self-pay | Admitting: Emergency Medicine

## 2015-09-14 NOTE — Progress Notes (Signed)
ED Antimicrobial Stewardship Positive Culture Follow Up   Todd Kelly is an 81 y.o. male who presented to [facility] on 09/10/2015 with a chief complaint of  Chief Complaint  Patient presents with  . Urinary Retention    Recent Results (from the past 720 hour(s))  Culture, blood (routine x 2) Call MD if unable to obtain prior to antibiotics being given     Status: None   Collection Time: 08/17/15 12:28 AM  Result Value Ref Range Status   Specimen Description BLOOD RIGHT ANTECUBITAL  Final   Special Requests BOTTLES DRAWN AEROBIC AND ANAEROBIC 5CC   Final   Culture NO GROWTH 5 DAYS  Final   Report Status 08/22/2015 FINAL  Final  Culture, blood (routine x 2) Call MD if unable to obtain prior to antibiotics being given     Status: None   Collection Time: 08/17/15 12:35 AM  Result Value Ref Range Status   Specimen Description BLOOD RIGHT HAND  Final   Special Requests BOTTLES DRAWN AEROBIC ONLY 5CC  Final   Culture NO GROWTH 5 DAYS  Final   Report Status 08/22/2015 FINAL  Final  Urine culture     Status: None   Collection Time: 09/10/15  7:57 PM  Result Value Ref Range Status   Specimen Description URINE, CATHETERIZED  Final   Special Requests Normal  Final   Culture   Final    40,000 COLONIES/ml STAPHYLOCOCCUS SPECIES (COAGULASE NEGATIVE)   Report Status 09/13/2015 FINAL  Final   Organism ID, Bacteria STAPHYLOCOCCUS SPECIES (COAGULASE NEGATIVE)  Final      Susceptibility   Staphylococcus species (coagulase negative) - MIC*    CIPROFLOXACIN >=8 RESISTANT Resistant     GENTAMICIN <=0.5 SENSITIVE Sensitive     NITROFURANTOIN <=16 SENSITIVE Sensitive     OXACILLIN <=0.25 SENSITIVE Sensitive     TETRACYCLINE >=16 RESISTANT Resistant     VANCOMYCIN 1 SENSITIVE Sensitive     TRIMETH/SULFA <=10 SENSITIVE Sensitive     CLINDAMYCIN <=0.25 SENSITIVE Sensitive     RIFAMPIN <=0.5 SENSITIVE Sensitive     Inducible Clindamycin NEGATIVE Sensitive     * 40,000 COLONIES/ml STAPHYLOCOCCUS  SPECIES (COAGULASE NEGATIVE)    '[x]'$  Treated with Cipro, organism resistant to prescribed antimicrobial '[]'$  Patient discharged originally without antimicrobial agent and treatment is now indicated  New antibiotic prescription: Septra DS 1 tab PO BID x 7 days.  Patient should stop Cipro  ED Provider: Jaquita Folds, PA-C   Norva Riffle 09/14/2015, 12:33 PM Infectious Diseases Pharmacist Phone# [phone]

## 2015-09-14 NOTE — Telephone Encounter (Signed)
Post ED Visit - Positive Culture Follow-up: Successful Patient Follow-Up  Culture assessed and recommendations reviewed by: '[]'$  Elenor Quinones, Pharm.D. '[]'$  Heide Guile, Pharm.D., BCPS '[]'$  Parks Neptune, Pharm.D. '[]'$  Alycia Rossetti, Pharm.D., BCPS '[]'$  [place], Pharm.D., BCPS, AAHIVP '[x]'$  Legrand Como, Pharm.D., BCPS, AAHIVP '[]'$  Milus Glazier, Pharm.D. '[]'$  Stephens November, [place].D.  Positive urine culture Staph  '[]'$  Patient discharged without antimicrobial prescription and treatment is now indicated '[x]'$  Organism is resistant to prescribed ED discharge antimicrobial '[]'$  Patient with positive blood cultures  Changes discussed with ED provider: Jaquita Folds PA New antibiotic prescription D/c cipro, start septra DS one tab po bid x 7 days Called to [facility] [phone]  Notified to wife 09/14/15 Kelly, Todd Aslin 09/14/2015, 3:06 PM

## 2015-09-15 ENCOUNTER — Ambulatory Visit (HOSPITAL_COMMUNITY)
Admission: RE | Admit: 2015-09-15 | Discharge: 2015-09-15 | Disposition: A | Discharge status: Home / Self Care | Payer: Commercial Managed Care - HMO | Source: Ambulatory Visit | Attending: Internal Medicine | Admitting: Internal Medicine

## 2015-09-15 ENCOUNTER — Encounter (HOSPITAL_COMMUNITY): Payer: Self-pay | Admitting: Cardiothoracic Surgery

## 2015-09-15 DIAGNOSIS — K8020 Calculus of gallbladder without cholecystitis without obstruction: Secondary | ICD-10-CM | POA: Insufficient documentation

## 2015-09-15 DIAGNOSIS — R918 Other nonspecific abnormal finding of lung field: Secondary | ICD-10-CM | POA: Diagnosis not present

## 2015-09-15 DIAGNOSIS — R590 Localized enlarged lymph nodes: Secondary | ICD-10-CM | POA: Insufficient documentation

## 2015-09-15 DIAGNOSIS — I517 Cardiomegaly: Secondary | ICD-10-CM | POA: Insufficient documentation

## 2015-09-15 DIAGNOSIS — I7090 Unspecified atherosclerosis: Secondary | ICD-10-CM | POA: Diagnosis not present

## 2015-09-15 DIAGNOSIS — J90 Pleural effusion, not elsewhere classified: Secondary | ICD-10-CM | POA: Diagnosis not present

## 2015-09-15 DIAGNOSIS — I2510 Atherosclerotic heart disease of native coronary artery without angina pectoris: Secondary | ICD-10-CM | POA: Diagnosis not present

## 2015-09-15 DIAGNOSIS — Z0189 Encounter for other specified special examinations: Secondary | ICD-10-CM | POA: Diagnosis present

## 2015-09-15 LAB — GLUCOSE, CAPILLARY: GLUCOSE-CAPILLARY: 102 mg/dL — AB (ref 65–99)

## 2015-09-15 IMAGING — CR DG CHEST 2V
2 series · 2 of 2 positions shown · non-contrast
Comparison: July 26, 2013.

CLINICAL DATA: Cough, shortness of breath.

EXAM:
CHEST  2 VIEW

[view not recorded (1 of 2)]
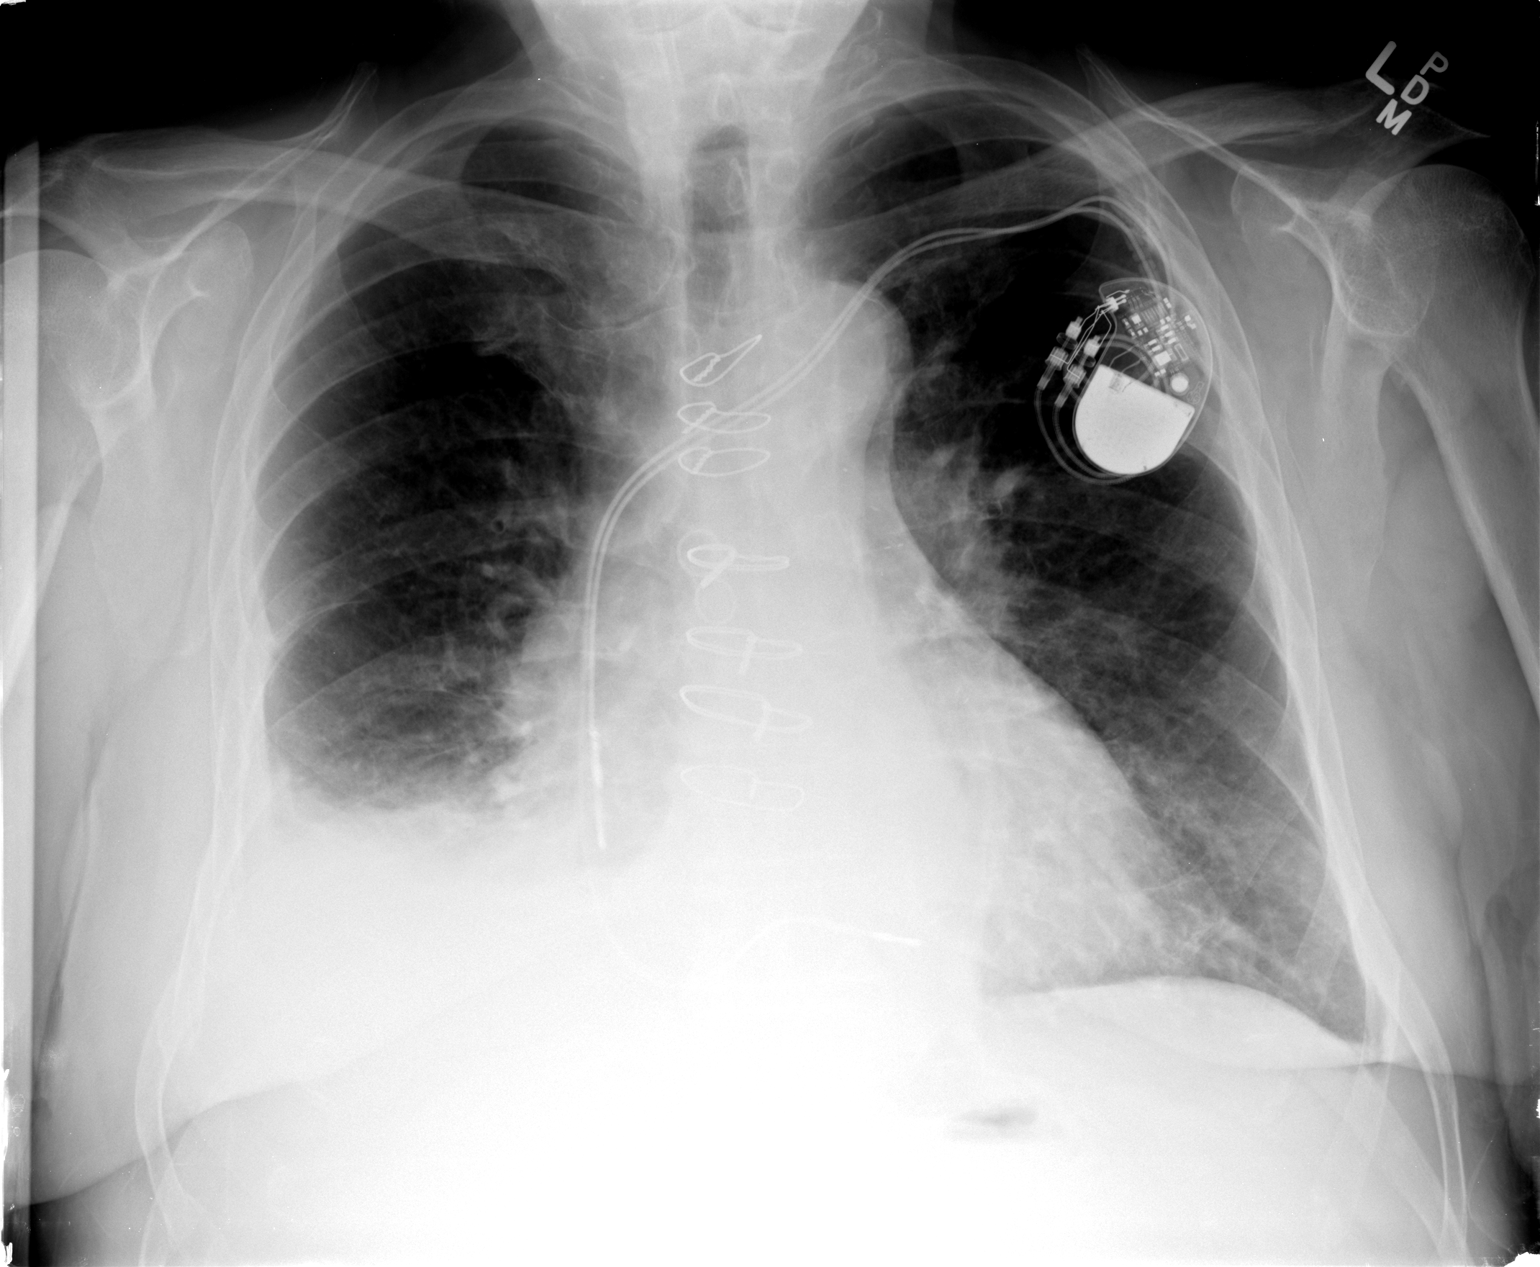

[view not recorded (2 of 2)]
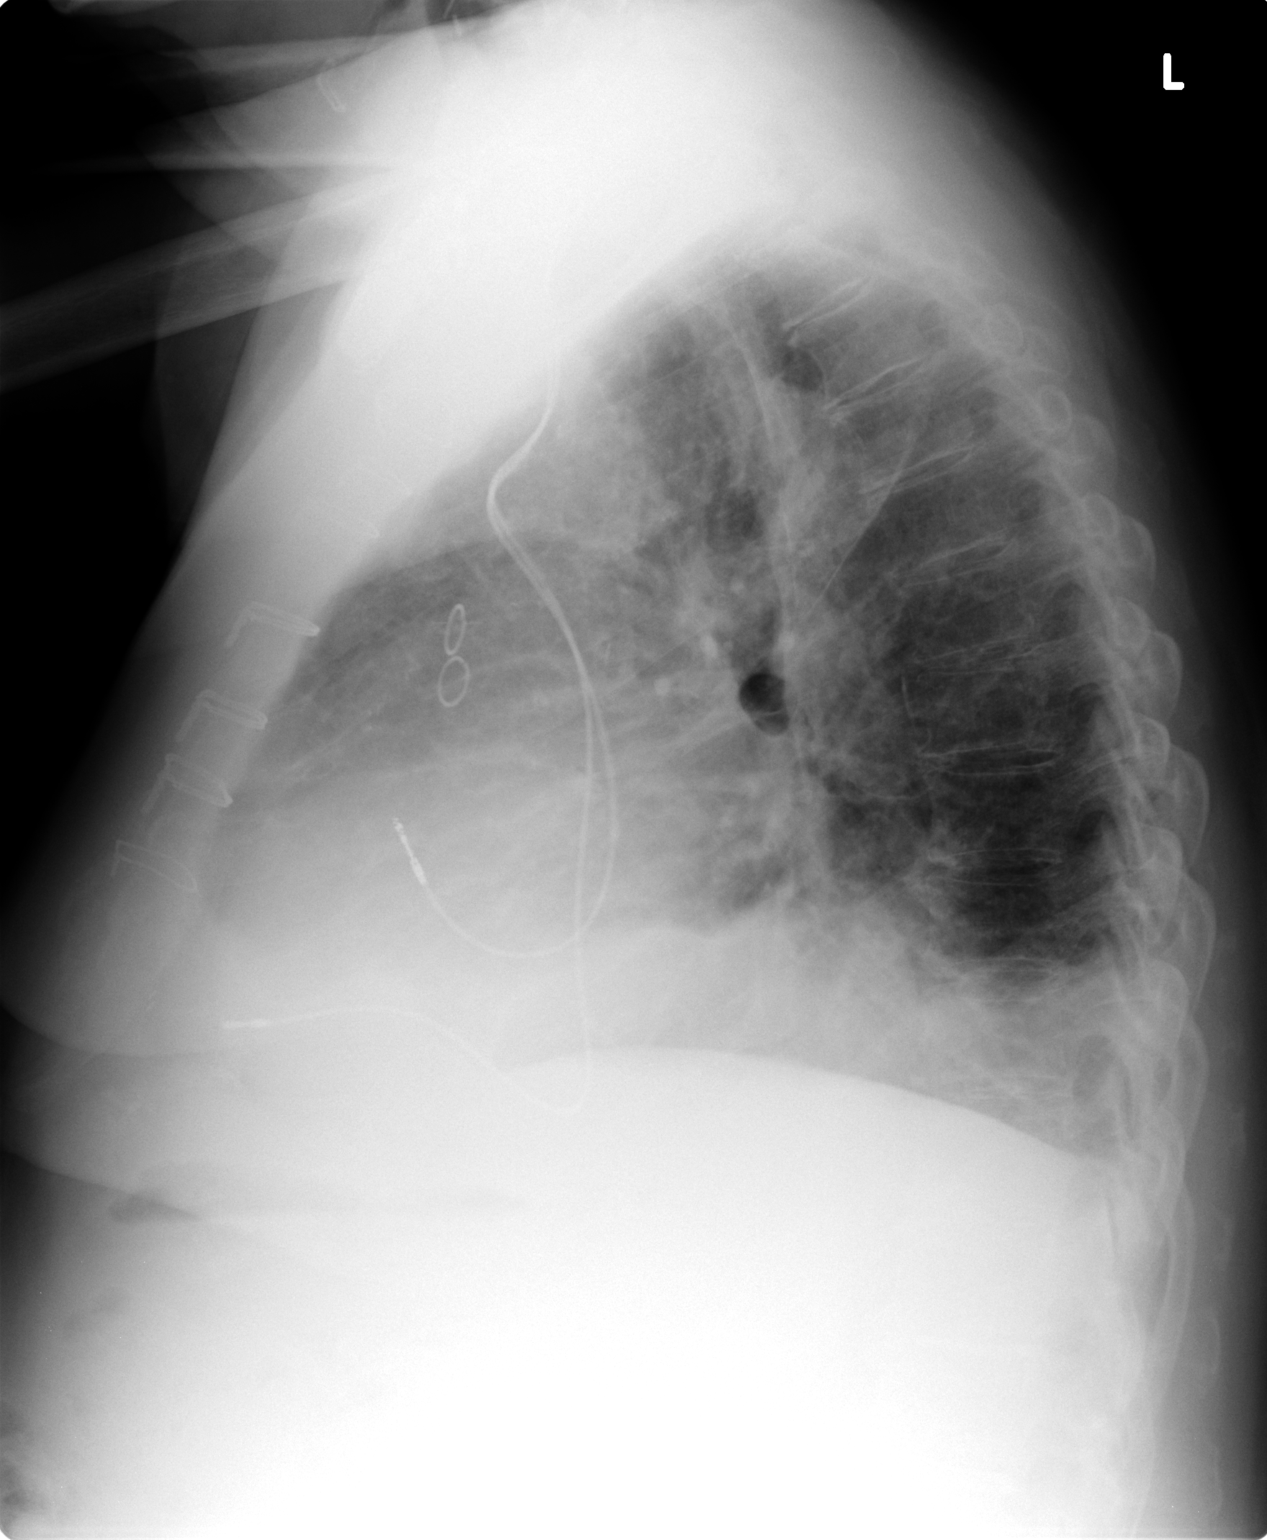

[2 of 2 positions shown; findings below may reference images not displayed]

FINDINGS: Stable mild cardiomegaly. Status post coronary artery bypass graft.
Left-sided pacemaker is unchanged. Left lung is clear. Mild right
pleural effusion is noted which is stable ; underlying pneumonia or
atelectasis cannot be excluded. No pneumothorax is noted. Bony
thorax is intact.
IMPRESSION: Stable mild right pleural effusion compared to prior exam.

## 2015-09-15 MED ORDER — FLUDEOXYGLUCOSE F - 18 (FDG) INJECTION: 11.0000 | Freq: Once | INTRAVENOUS | Status: DC | PRN

## 2015-09-18 ENCOUNTER — Ambulatory Visit (HOSPITAL_BASED_OUTPATIENT_CLINIC_OR_DEPARTMENT_OTHER): Payer: Commercial Managed Care - HMO | Admitting: Internal Medicine

## 2015-09-18 ENCOUNTER — Encounter: Payer: Self-pay | Admitting: *Deleted

## 2015-09-18 ENCOUNTER — Telehealth: Payer: Self-pay | Admitting: Cardiovascular Disease

## 2015-09-18 VITALS — BP 92/56 | HR 58 | Temp 98.6°F | Resp 17 | Ht 68.0 in | Wt 187.2 lb

## 2015-09-18 DIAGNOSIS — C3412 Malignant neoplasm of upper lobe, left bronchus or lung: Secondary | ICD-10-CM

## 2015-09-18 DIAGNOSIS — C7972 Secondary malignant neoplasm of left adrenal gland: Secondary | ICD-10-CM | POA: Diagnosis not present

## 2015-09-18 DIAGNOSIS — C7971 Secondary malignant neoplasm of right adrenal gland: Secondary | ICD-10-CM | POA: Diagnosis not present

## 2015-09-18 NOTE — Progress Notes (Signed)
Oncology Nurse Navigator Documentation  Oncology Nurse Navigator Flowsheets 09/18/2015  Navigator Encounter Type Clinic/MDC/spoke with patient and family today. Dr. Julien Nordmann updated him on his dx and tx plan.   Dr. Julien Nordmann request patient get port placement with Dr. Servando Snare.  I notified their office to schedule.  I gave and explained information of his lung cancer dx and staging.  Dr. Julien Nordmann also asked I follow up on PDL1 testing.  I notified Tammy at [facility] pathology to send tissue for testing.  Patient and family would like to wait on results of testing before starting tx.    Patient Visit Type Initial  Treatment Phase Abnormal Scans  Interventions Coordination of Care;Education Method  Coordination of Care MD Appointments  Education Method Verbal;Written  Time Spent with Patient 35

## 2015-09-18 NOTE — Telephone Encounter (Signed)
Called wife again, she answered phone, we discussed recommendations in detail, she verbalized understanding and voiced thanks for the call.

## 2015-09-18 NOTE — Telephone Encounter (Signed)
[name] is calling because Todd Kelly bp was 92/56 and wants to know if any of his medication needs to be cut back . Please call .[name]  Thanks

## 2015-09-18 NOTE — Telephone Encounter (Signed)
Left message for wife to call, recommended changes.

## 2015-09-18 NOTE — Progress Notes (Signed)
[place] [phone]   [phone]  OFFICE PROGRESS NOTE  WEBB, Valla Leaver, MD [place] [address] [place] [address]  DIAGNOSIS: Stage IV (T3, N2, M1 B) non-small cell lung cancer, invasive squamous cell carcinoma diagnosed in November 2016 and presented with large left upper lobe mass with 2 large satellite nodules and associated AP window lymph nodes as well as bilateral adrenal metastasis.  PRIOR THERAPY: None  CURRENT THERAPY: None.   INTERVAL HISTORY: Todd Kelly 82 y.o. male returns to the clinic today for follow-up visit accompanied by his wife. The patient is feeling fine today with no specific complaints except for mild fatigue and shortness breath with exertion. He denied having any significant chest pain but has mild cough with no hemoptysis. He denied having any significant weight loss or night sweats. The patient denied having any nausea or vomiting, no fever or chills. He has several studies performed recently including CT scan of the head that showed no evidence for brain metastasis. The patient also had a PET scan that showed the large left upper lobe lung mass with satellite pulmonary nodules in addition to mediastinal lymphadenopathy and bilateral adrenal metastasis. He underwent bronchoscopy with bronchial biopsy of the left upper lobe and brushing as well as endoscopic bronchial ultrasound and transbronchial biopsy of the lymph nodes at level 7 and 10L under the care of Dr. Servando Snare and the final pathology was consistent with invasive squamous cell carcinoma. The patient is here today for evaluation and discussion of his treatment options.  MEDICAL HISTORY: Past Medical History  Diagnosis Date  . OSA (obstructive sleep apnea)   . Bronchial pneumonia   . Bronchitis, chronic with acute exacerbation ([place])   . COPD (chronic obstructive pulmonary disease) ([place])   . Hypertension   . Arteriosclerotic heart disease     . Atrial fibrillation ([place])   . Cardiac pacemaker in situ 04/12/2008    [place]  . Cerebrovascular disease   . Peripheral vascular disease ([place])   . Hypercholesteremia   . Obesity   . Diverticulosis of colon   . Adenomatous polyp of colon 08/1994  . Hemorrhoids   . DJD (degenerative joint disease)   . Chronic low back pain   . Anxiety   . CHF (congestive heart failure) ([place])   . CHB (complete heart block) ([place])   . Carotid arterial disease ([place])   . CAD (coronary artery disease)   . S/P CABG (coronary artery bypass graft) 2005  . Shortness of breath dyspnea   . HOH (hard of hearing)   . Headache     sinus  . Presence of permanent cardiac pacemaker     ALLERGIES:  is allergic to penicillins.  MEDICATIONS:  Current Outpatient Prescriptions  Medication Sig Dispense Refill  . ALPRAZolam (XANAX) 0.5 MG tablet Take 1 tablet (0.5 mg total) by mouth 3 (three) times daily as needed for anxiety. Not to exceed 3 per day. (Patient taking differently: Take 0.5 mg by mouth 2 (two) times daily. Not to exceed 3 per day.) 270 tablet 1  . amiodarone (PACERONE) 200 MG tablet Take 1 tablet (200 mg total) by mouth daily. 90 tablet 3  . atorvastatin (LIPITOR) 20 MG tablet Take 1 tablet (20 mg total) by mouth daily. 90 tablet 3  . ciprofloxacin (CIPRO) 500 MG tablet Take 1 tablet (500 mg total) by mouth 2 (two) times daily. 28 tablet 0  . Coenzyme Q10 (COQ10) 100  MG CAPS Take 100 mg by mouth daily.     [name] docusate sodium (COLACE) 250 MG capsule Take 250 mg by mouth daily after supper.    . ferrous sulfate 325 (65 FE) MG tablet Take 325 mg by mouth daily after supper.     . fluticasone (FLONASE) 50 MCG/ACT nasal spray USE ONE SPRAY IN EACH NOSTRIL TWICE A DAY (Patient taking differently: USE ONE SPRAY IN EACH NOSTRIL TWICE A DAY AS NEEDED FOR CONGESTION) 52 g 2  . furosemide (LASIX) 40 MG tablet Take 1 tablet (40 mg total) by mouth daily. (Patient taking differently: Take 40 mg by mouth daily as  needed (weight of 186 lb or more). ) 90 tablet 3  . HYDROcodone-acetaminophen (NORCO/VICODIN) 5-325 MG tablet     . levofloxacin (LEVAQUIN) 500 MG tablet TAKE 1 TABLET BY MOUTH DAILY FOR 10 DAYS  0  . losartan (COZAAR) 100 MG tablet Take 1 tablet (100 mg total) by mouth at bedtime. (Patient taking differently: Take 100 mg by mouth daily after supper. ) 10 tablet 0  . meclizine (ANTIVERT) 25 MG tablet Take 1/2 to 1 tablet by mouth every 6 hours as needed for dizziness (Patient taking differently: Take 25 mg by mouth 2 (two) times daily as needed for dizziness. ) 90 tablet 3  . metoprolol tartrate (LOPRESSOR) 25 MG tablet Take 1 tablet (25 mg total) by mouth 2 (two) times daily. (Patient taking differently: Take 12.5-25 mg by mouth 2 (two) times daily. Take 1/2 tablet (12.5 mg)by mouth in the morning and 1 tablet (25 mg) in the evening.) 180 tablet 3  . montelukast (SINGULAIR) 10 MG tablet TAKE 1 TABLET AT BEDTIME (Patient taking differently: TAKE 1 TABLET BY MOUTH DAILY AFTER SUPPER) 90 tablet 0  . Omega-3 Fatty Acids (FISH OIL) 1000 MG CAPS Take 1,000 mg by mouth daily.     Vladimir Faster Glycol-Propyl Glycol (SYSTANE OP) Place 1 drop into both eyes 4 (four) times daily.    . polyethylene glycol (MIRALAX / GLYCOLAX) packet Take 17 g by mouth daily as needed (constipation).    . potassium chloride (K-DUR) 10 MEQ tablet Take 20 mEq by mouth daily as needed (if taking lasix).    . predniSONE (DELTASONE) 5 MG tablet Take 5 mg by mouth daily.     . ranitidine (ZANTAC) 150 MG tablet Take 1 tablet (150 mg total) by mouth 2 (two) times daily. 180 tablet 2  . rivaroxaban (XARELTO) 20 MG TABS tablet Take 1 tablet (20 mg total) by mouth daily with supper. (Patient taking differently: Take 20 mg by mouth daily after supper. ) 90 tablet 3  . sulfamethoxazole-trimethoprim (BACTRIM DS,SEPTRA DS) 800-160 MG tablet     . SYMBICORT 160-4.5 MCG/ACT inhaler USE 2 INHALATIONS TWICE A DAY 30.6 g 1  . tiotropium (SPIRIVA  HANDIHALER) 18 MCG inhalation capsule INHALE THE CONTENTS OF 1 CAPSULE DAILY (Patient taking differently: Place 18 mcg into inhaler and inhale daily after supper. INHALE THE CONTENTS OF 1 CAPSULE DAILY) 90 capsule 3  . vitamin B-12 (CYANOCOBALAMIN) 500 MCG tablet Take 500 mcg by mouth daily after supper.     . vitamin C (ASCORBIC ACID) 500 MG tablet Take 500 mg by mouth daily after supper.      No current facility-administered medications for this visit.   Facility-Administered Medications Ordered in Other Visits  Medication Dose Route Frequency Provider Last Rate Last Dose  . 0.9 %  sodium chloride infusion    Continuous PRN Miguel Rota  Amedeo Plenty, CRNA      . fludeoxyglucose F - 18 (FDG) injection 11 milli Curie  11 milli Curie Intravenous Once PRN Curt Bears, MD        SURGICAL HISTORY:  Past Surgical History  Procedure Laterality Date  . Pta to right common femoral artery  2001    Dr. Lyndel Safe  . Carotid endarterectomy  2002    bilateral.  Dr. Kellie Simmering  . Coronary artery bypass graft  2005  . Pacemaker placement  July 2009    for CHB  Dr. Gwenlyn Found. [facility]  . Appendectomy  1948  . US echocardiography  11/06/2011    Mod. LVH w/mod. depressed systolic function,EF 76-73%,ALPFX LV hypokinesis,mildly dilated LA,mild aortic root dilatation  . Nm myoview ltd  09/04/2010    mild perfusion defect basal inferior,mid inferior, & apical inferior regions. LV systolic function deteriorated since 2006  . Cardiac catheterization  02/02/2008    patent grafts  . Adenoidectomy    . Eye surgery Bilateral     Cataract  . Video bronchoscopy with endobronchial ultrasound N/A 09/09/2015    Procedure: VIDEO BRONCHOSCOPY WITH ENDOBRONCHIAL ULTRASOUND;  Surgeon: Grace Isaac, MD;  Location: [facility];  Service: Thoracic;  Laterality: N/A;    REVIEW OF SYSTEMS:  Constitutional: positive for fatigue Eyes: negative Ears, nose, mouth, throat, and face: negative Respiratory: positive for cough and dyspnea on  exertion Cardiovascular: negative Gastrointestinal: negative Genitourinary:negative Integument/breast: negative Hematologic/lymphatic: negative Musculoskeletal:negative Neurological: negative Behavioral/Psych: negative Endocrine: negative Allergic/Immunologic: negative   PHYSICAL EXAMINATION: General appearance: alert, cooperative, fatigued and no distress Head: Normocephalic, without obvious abnormality, atraumatic Neck: no adenopathy, no JVD, supple, symmetrical, trachea midline and thyroid not enlarged, symmetric, no tenderness/mass/nodules Lymph nodes: Cervical, supraclavicular, and axillary nodes normal. Resp: clear to auscultation bilaterally Back: symmetric, no curvature. ROM normal. No CVA tenderness. Cardio: regular rate and rhythm, S1, S2 normal, no murmur, click, rub or gallop GI: soft, non-tender; bowel sounds normal; no masses,  no organomegaly Extremities: extremities normal, atraumatic, no cyanosis or edema Neurologic: Alert and oriented X 3, normal strength and tone. Normal symmetric reflexes. Normal coordination and gait  ECOG PERFORMANCE STATUS: 1 - Symptomatic but completely ambulatory  Blood pressure 92/56, pulse 58, temperature 98.6 F (37 C), temperature source Oral, resp. rate 17, height 5' 8"  (1.727 m), weight 187 lb 3.2 oz (84.913 kg), SpO2 96 %.  LABORATORY DATA: Lab Results  Component Value Date   WBC 8.4 09/10/2015   HGB 12.7* 09/10/2015   HCT 39.2 09/10/2015   MCV 96.3 09/10/2015   PLT 137* 09/10/2015      Chemistry      Component Value Date/Time   NA 141 09/10/2015 1826   NA 144 09/04/2015 1339   K 4.3 09/10/2015 1826   K 4.4 09/04/2015 1339   CL 106 09/10/2015 1826   CO2 25 09/10/2015 1826   CO2 29 09/04/2015 1339   BUN 23* 09/10/2015 1826   BUN 26.4* 09/04/2015 1339   CREATININE 1.18 09/10/2015 1826   CREATININE 1.2 09/04/2015 1339   CREATININE 1.19 09/14/2013 1652      Component Value Date/Time   CALCIUM 8.6* 09/10/2015 1826    CALCIUM 8.5 09/04/2015 1339   ALKPHOS 54 09/08/2015 1112   ALKPHOS 57 09/04/2015 1339   AST 13* 09/08/2015 1112   AST 13 09/04/2015 1339   ALT 11* 09/08/2015 1112   ALT <9 09/04/2015 1339   BILITOT 0.6 09/08/2015 1112   BILITOT 0.60 09/04/2015 1339  RADIOGRAPHIC STUDIES: Dg Chest 2 View  09/08/2015  CLINICAL DATA:  Lung mass.  Preoperative bronchoscopy EXAM: CHEST  2 VIEW COMPARISON:  Chest CT August 27, 2015; chest radiograph August 26, 2015 FINDINGS: There is a mass again noted in the left upper lobe region measuring 5.1 x 5.1 cm. A pacemaker obscures the smaller more peripheral left upper lobe mass seen on recent CT. There is no edema or consolidation. Heart is upper normal in size with pulmonary vascularity within normal limits. There is no appreciable adenopathy by radiography. Pacemaker leads are attached to the right atrium right ventricle. Patient is status post coronary artery bypass grafting. No blastic or lytic bone lesions appreciable. IMPRESSION: Dominant left upper lobe mass. Smaller left upper lobe mass likely obscured by pacemaker. No frank edema or consolidation. No new opacity. No change in cardiac silhouette. Electronically Signed   By: Lowella Grip III M.D.   On: 09/08/2015 13:42   Dg Chest 2 View  08/26/2015  CLINICAL DATA:  Followup pneumonia EXAM: CHEST - 2 VIEW COMPARISON:  08/19/2015 FINDINGS: Cardiac shadow remains enlarged. A pacing device is again seen. The lungs are again well aerated. There is a persistent left suprahilar density extending into the left upper lobe. Given the persistent nature the possibility of a central mass with peripheral consolidation deserves consideration. No new focal infiltrate is seen. No bony abnormality is noted. IMPRESSION: Persistent left upper lobe changes. CT with contrast is recommended for further evaluation to rule out an underlying mass lesion causing the upper lobe consolidation. Electronically Signed   By: Inez Catalina  M.D.   On: 08/26/2015 12:32   Dg Ribs Bilateral W/chest  08/19/2015  CLINICAL DATA:  81 year old with injury to the anterior lower left ribs 2 days ago. Initial encounter. Personal history of bilateral rib fractures. EXAM: BILATERAL RIBS AND CHEST - 4+ VIEW 1819 hr: COMPARISON:  Chest x-rays earlier same date 1139 hr and previously. No prior rib imaging. FINDINGS: Site of maximum pain and tenderness was marked with a metallic BB. No acute rib fracture identified. Old healed fractures involving the left lateral 8th rib and the right lateral 5th, 6th, 7th and 8th ribs and the right anterior 9th rib. Prior sternotomy for CABG. Cardiac silhouette markedly enlarged, unchanged. Further improvement in aeration in the right lower lobe since earlier today, with mild atelectasis persisting. Streaky and patchy airspace opacity in the left upper lobe, unchanged. No new pulmonary parenchymal abnormalities. Mild pulmonary venous hypertension without overt edema. Left subclavian dual lead transvenous pacemaker unchanged and intact. IMPRESSION: 1. No acute rib fractures identified. 2. Old healed bilateral rib fractures as detailed above. 3. Further improvement in aeration in the right lung base with mild atelectasis persisting. 4. Stable left upper lobe atelectasis and/or bronchopneumonia. 5. No new abnormalities. Electronically Signed   By: Evangeline Dakin M.D.   On: 08/19/2015 18:38   Ct Head W Wo Contrast  09/08/2015  CLINICAL DATA:  81 year old male with new diagnosis of lung cancer. Weakness. Weight loss. Staging. Initial encounter. EXAM: CT HEAD WITHOUT AND WITH CONTRAST TECHNIQUE: Contiguous axial images were obtained from the base of the skull through the vertex without and with intravenous contrast CONTRAST:  74m OMNIPAQUE IOHEXOL 300 MG/ML  SOLN COMPARISON:  05/05/2008 CT angiogram. FINDINGS: No intracranial mass or bony destructive lesion. Global atrophy without hydrocephalus. Small vessel disease type changes  without CT evidence of large acute infarct. No intracranial hemorrhage. Vascular calcifications. Poor delineation full extent of the right vertebral artery. IMPRESSION:  No CT evidence of intracranial metastatic disease. Global atrophy. Small vessel disease type changes. Vascular calcifications. Full extent of the right vertebral artery not visualized. Electronically Signed   By: Genia Del M.D.   On: 09/08/2015 16:40   Ct Chest W Contrast  08/27/2015  CLINICAL DATA:  Recent pneumonia. Cough. Short of breath and weight loss. Ex-smoker. EXAM: CT CHEST WITH CONTRAST TECHNIQUE: Multidetector CT imaging of the chest was performed during intravenous contrast administration. CONTRAST:  75m ISOVUE-300 IOPAMIDOL (ISOVUE-300) INJECTION 61% COMPARISON:  Chest radiographs, most recent 1 day prior. No prior CT. FINDINGS: Mediastinum/Nodes: Aortic and branch vessel atherosclerosis. Ulcerative plaque throughout the descending thoracic aorta. Mild cardiomegaly with pacer in place. No pericardial effusion. Pulmonary artery enlargement, outflow tract 3.3 cm. The central left upper lobe mass invades the mediastinum and is intimately associated with the left pulmonary artery. An adjacent 9 mm AP window node is suspicious based on location. Left infrahilar node measures 12 mm (image 26,, series 3), suspicious. Lungs/Pleura: Small right greater than left pleural effusions. Minimal patchy right upper lobe opacities are favored to represent areas of subsegmental atelectasis. Spiculated subpleural left upper lobe pulmonary nodule measures 1.9 x 2.1 cm (image 21, series 4). Central left upper lobe lung mass, obstructing the left upper lobe bronchus. This measures 5.4 x 6.6 cm (image 23, series 4). Upper abdomen: Apparent subtle irregularity involving the right lobe of the liver at approximately 9 mm (image 63, series 3). Normal imaged portions of the spleen, stomach, pancreas, right adrenal gland. Mild left adrenal thickening and  nodularity. On the order of 1.6 cm. An upper pole left renal lesion is most consistent with a cyst or minimally complex cyst at approximately 9 mm. Abdominal aortic atherosclerosis. Musculoskeletal: Probable sebaceous cyst superficial to the spinous process at 1.6 cm (image 15, series 3). lateral right rib remote trauma. IMPRESSION: 1. Central left upper lobe lung mass, consistent with primary bronchogenic carcinoma. This has direct mediastinal invasion with left mediastinal and infrahilar adenopathy, suspicious for nodal metastasis. 2. A more peripheral left upper lobe spiculated nodule is suspicious for a synchronous primary bronchogenic carcinoma. 3. Small bilateral pleural effusions. 4. Subtle irregularity involving the right hepatic capsule. Indeterminate. 5. Left adrenal nodularity, also indeterminate. Consider PET with attention to these 2 areas. 6. Pulmonary artery enlargement suggests pulmonary arterial hypertension. These results will be called to the ordering clinician or representative by the Radiologist Assistant, and communication documented in the PACS or zVision Dashboard. Electronically Signed   By: KAbigail MiyamotoM.D.   On: 08/27/2015 09:30   Nm Pet Image Initial (pi) Skull Base To Thigh  09/15/2015  CLINICAL DATA:  Initial treatment strategy for lung mass. EXAM: NUCLEAR MEDICINE PET SKULL BASE TO THIGH TECHNIQUE: 11.0 mCi F-18 FDG was injected intravenously. Full-ring PET imaging was performed from the skull base to thigh after the radiotracer. CT data was obtained and used for attenuation correction and anatomic localization. FASTING BLOOD GLUCOSE:  Value: 102 mg/dl COMPARISON:  Chest CT 08/27/2015. FINDINGS: NECK Nonenlarged but mildly hypermetabolic (SUVmax = 6.7) left level Ib lymph node favored to be reactive. CHEST Interval enlargement of a a 5.3 x 5.4 cm hypermetabolic (SUVmax = 275.6)EPPIin the central aspect of the left upper lobe (image 65 of series 4). 2 satellite nodules in the left  upper lobe are also noted and are hypermetabolic measuring 2.6 x 2.3 cm (SUVmax = 13.3)on image 23 of series 8, and 2.6 x 1.7 cm (SUVmax = 6.6)on image 24 of series  8. There is some surrounding ground-glass attenuation, consolidative airspace disease and architectural distortion adjacent to these left upper lobe nodules and mass, most likely to reflect postobstructive pneumonitis, although some early lymphangitic spread of tumor is not excluded. Mildly enlarged (12 mm) AP window lymph node is hypermetabolic (SUVmax = 3.6). No other mediastinal, right hilar or supraclavicular lymphadenopathy is noted. Small left and moderate right-sided pleural effusions layer dependently and are simple in appearance, without associated pleural hypermetabolism. No right-sided pulmonary nodules are noted. Mild cardiomegaly with left atrial dilatation. There is atherosclerosis of the thoracic aorta, the great vessels of the mediastinum and the coronary arteries, including calcified atherosclerotic plaque in the left main, left anterior descending, left circumflex and right coronary arteries. Status post median sternotomy for CABG, including LIMA to the LAD. Calcifications of the mitral annulus. Mild calcifications of the aortic valve. Left-sided pacemaker device in position with lead tips terminating in the right atrium and right ventricular apex. ABDOMEN/PELVIS Hypermetabolic adrenal gland nodules bilaterally measuring 1.3 x 1.9 cm on the right (SUVmax = 4.8) and 1.7 x 2.4 cm on the left (SUVmax = 6.5), presumably adrenal metastasis. No abnormal hypermetabolic activity within the liver, pancreas, or spleen. No hypermetabolic lymph nodes in the abdomen or pelvis. High attenuation material lying dependently in the gallbladder, presumably tiny gallstones. No current findings to suggest an acute cholecystitis at this time. Low-attenuation lesions in the kidneys bilaterally, incompletely characterized on today's noncontrast CT examination,  but demonstrate no hypermetabolism, presumably cysts, largest of which measures 4.3 cm in the interpolar region of the right kidney. Extensive atherosclerosis throughout the abdominal and pelvic vasculature, without definite aneurysm. No pathologic dilatation of small bowel or colon. Small bilateral inguinal hernias (right greater than left) containing fat. Foley balloon catheter in position, with completely decompressed urinary bladder. SKELETON Median sternotomy wires. No focal hypermetabolic activity to suggest skeletal metastasis. IMPRESSION: 1. 5.3 x 5.4 cm hypermetabolic left upper lobe mass with 2 large hypermetabolic satellite nodules and associated AP window lymphadenopathy. In addition, there are bilateral adrenal metastasis, and there is a small left pleural effusion. Findings are compatible with stage IV lung cancer (T3, N1, M1b). 2. Nonenlarged hypermetabolic left level Ib lymph node is nonspecific, and favored to be physiologic. 3. Moderate right pleural effusion layering dependently. 4. Cardiomegaly with left atrial dilatation. 5. Atherosclerosis, including left main and 3 vessel coronary artery disease. Status post median sternotomy for CABG, including LIMA to the LAD. 6. Cholelithiasis without evidence of acute cholecystitis at this time. 7. Additional incidental findings, as above. Electronically Signed   By: Vinnie Langton M.D.   On: 09/15/2015 15:40   Dg Chest Port 1 View  08/19/2015  CLINICAL DATA:  81 year old male with left chest pain. Rib fractures. EXAM: PORTABLE CHEST 1 VIEW COMPARISON:  08/16/2015 and prior chest radiographs FINDINGS: Cardiomegaly, CABG changes and left-sided pacemaker again noted. Focal left upper lobe opacity/atelectasis/ airspace disease is unchanged. Improved right basilar aeration noted with mild right basilar atelectasis present. There is no evidence of pneumothorax. There is a trace right pleural effusion present. Fractures of the right fifth through eighth  ribs are noted. IMPRESSION: Improved right basilar aeration without other significant change. Continued left upper lobe opacity which may represent atelectasis or airspace disease. Fractures of the right fifth through eighth ribs. Trace right pleural effusion. Electronically Signed   By: Margarette Canada M.D.   On: 08/19/2015 13:28    ASSESSMENT AND PLAN: This is a very pleasant 81 years old white male recently  diagnosed with a stage IV non-small cell lung cancer, invasive squamous cell carcinoma. PDL 1 expression result is still pending. I had a lengthy discussion with the patient and his family today about his current disease stage, prognosis and treatment options. I gave the patient the option of palliative care and hospice referral versus consideration of treatment with immunotherapy with Ketruda (pembrolizumab) if the PDL 1 expression is 50% or more versus systemic chemotherapy with either carboplatin and paclitaxel or carboplatin and gemcitabine. The patient and his family are very interested in treatment with immunotherapy. We will wait for the results of the PDL 1 test. He is also interested in consideration of systemic chemotherapy with carboplatin and gemcitabine if PDL1 expression is less than 50%. We will arrange a treatment plan for this patient once we have the results of the PDL 1 expression in the next few days. The patient was advised to call immediately if he has any concerning symptoms in the interval. The patient voices understanding of current disease status and treatment options and is in agreement with the current care plan.  All questions were answered. The patient knows to call the clinic with any problems, questions or concerns. We can certainly see the patient much sooner if necessary.  I spent 20 minutes counseling the patient face to face. The total time spent in the appointment was 30 minutes.  Disclaimer: This note was dictated with voice recognition software. Similar  sounding words can inadvertently be transcribed and may not be corrected upon review.

## 2015-09-18 NOTE — Telephone Encounter (Signed)
Agree, cut losartan in half, temporarily, until acute illness resolves.

## 2015-09-18 NOTE — Telephone Encounter (Signed)
Returned call to patient's wife. She states pt was recently hospitalized w/ pneumonia, when discharged found to be unable to urinate, went back to ED and was placed w/ cath & leg bag and ABs to treat UTI/neurogenic bladder.  She reports pt had just received call from ER on Sunday stating culture showed infection immune to 1st round of ABs, he is now on 2nd round of ABs for treatment.  Pt also has recent dx of lung CA which is being followed by Dr. Julien Nordmann.  At a visit today, pt's BP was 92/56. Wife states no acute problems/symptoms beyond pt experiencing weakness all the time. He is not having orthostatic symptoms or lightheadedness/dizziness o/w.  Informed her the reported BP is on low end of normal but probably OK to make adjustment such as cutting dose of losartan, would defer to Dr. Sallyanne Kuster for best advice. Wife acknowledged.

## 2015-09-19 ENCOUNTER — Other Ambulatory Visit: Payer: Self-pay | Admitting: Internal Medicine

## 2015-09-20 ENCOUNTER — Encounter: Payer: Self-pay | Admitting: Internal Medicine

## 2015-09-22 ENCOUNTER — Other Ambulatory Visit: Payer: Self-pay | Admitting: Internal Medicine

## 2015-09-22 ENCOUNTER — Encounter (HOSPITAL_COMMUNITY): Payer: Self-pay

## 2015-09-22 ENCOUNTER — Telehealth: Payer: Self-pay | Admitting: *Deleted

## 2015-09-22 DIAGNOSIS — C3412 Malignant neoplasm of upper lobe, left bronchus or lung: Secondary | ICD-10-CM

## 2015-09-22 NOTE — Telephone Encounter (Signed)
Oncology Nurse Navigator Documentation  Oncology Nurse Navigator Flowsheets 09/22/2015  Navigator Encounter Type Telephone/I received a phone call from family that stated they were confused about port and chemo being ordered.  I called and explained Interventional radiology will call with appt for port placement.  I also stated that Dr. Julien Nordmann was waiting on PDL 1 results to possible start immunotherapy first line.  They verbalized understanding.  Dr. Julien Nordmann came in later today and stated results are back and to call family with the update.  I called and explained they will be seen with Dr. Julien Nordmann to talk about next steps.   Patient Visit Type Follow-up  Treatment Phase Abnormal Scans  Interventions Coordination of Care  Coordination of Care Radiology  Education Method -  Time Spent with Patient 30

## 2015-09-23 ENCOUNTER — Telehealth: Payer: Self-pay | Admitting: Medical Oncology

## 2015-09-23 ENCOUNTER — Telehealth: Payer: Self-pay | Admitting: *Deleted

## 2015-09-23 NOTE — Telephone Encounter (Signed)
Per Dr Julien Nordmann pt may hold xarelto for 24 hours prior to port a cath procedure.

## 2015-09-23 NOTE — Telephone Encounter (Signed)
Per staff message and POF I have scheduled appts. Advised scheduler of appts and tro move labs . JMW

## 2015-09-24 ENCOUNTER — Other Ambulatory Visit: Payer: Self-pay | Admitting: Medical Oncology

## 2015-09-24 ENCOUNTER — Telehealth: Payer: Self-pay | Admitting: Internal Medicine

## 2015-09-24 ENCOUNTER — Telehealth: Payer: Self-pay | Admitting: *Deleted

## 2015-09-24 NOTE — Telephone Encounter (Signed)
sw pt and advised on DEC appt...pt ok and aware °

## 2015-09-24 NOTE — Telephone Encounter (Signed)
per pof to r/s pt 1st chemo until after port placement-sent MW email to r/s trmt

## 2015-09-25 ENCOUNTER — Telehealth: Payer: Self-pay | Admitting: *Deleted

## 2015-09-25 ENCOUNTER — Other Ambulatory Visit: Payer: Commercial Managed Care - HMO

## 2015-09-25 ENCOUNTER — Encounter: Payer: Self-pay | Admitting: *Deleted

## 2015-09-25 ENCOUNTER — Other Ambulatory Visit: Payer: Self-pay | Admitting: Radiology

## 2015-09-25 NOTE — Telephone Encounter (Signed)
Per staff message and POF I have scheduled appts. Advised scheduler of appts and first available given. JMW  

## 2015-09-25 NOTE — Progress Notes (Signed)
Spent time with patients wife reviewing side effects of Gemzar and Carbo.  Husband felt too ill to attend

## 2015-09-26 ENCOUNTER — Other Ambulatory Visit: Payer: Self-pay | Admitting: Medical Oncology

## 2015-09-26 ENCOUNTER — Other Ambulatory Visit: Payer: Commercial Managed Care - HMO

## 2015-09-26 ENCOUNTER — Telehealth: Payer: Self-pay | Admitting: *Deleted

## 2015-09-26 ENCOUNTER — Telehealth: Payer: Self-pay | Admitting: Internal Medicine

## 2015-09-26 ENCOUNTER — Other Ambulatory Visit: Payer: Self-pay | Admitting: Radiology

## 2015-09-26 DIAGNOSIS — C3412 Malignant neoplasm of upper lobe, left bronchus or lung: Secondary | ICD-10-CM

## 2015-09-26 NOTE — Telephone Encounter (Signed)
All appt cx per pof...s.w Diane and confirmed that all appts are to be cx

## 2015-09-26 NOTE — Progress Notes (Signed)
t wants to cancel PICC line-IR notified.

## 2015-09-26 NOTE — Telephone Encounter (Signed)
Received call from Tyson Dense, patients wife, who was in my chemo class yesterday 09/25/15.  Wife stated after chemo class her husband has decided to not have chemotherapy but to go with hospice instead.  Hospice was in patients home today.  Wife asked that we cancel all appointments and to let Dr. Earlie Server know.  Called Abelina Bachelor RN desk nurse to let her know this information.

## 2015-09-29 ENCOUNTER — Other Ambulatory Visit: Payer: Commercial Managed Care - HMO

## 2015-09-29 ENCOUNTER — Ambulatory Visit: Payer: Commercial Managed Care - HMO

## 2015-09-29 ENCOUNTER — Telehealth: Payer: Self-pay | Admitting: Medical Oncology

## 2015-09-29 ENCOUNTER — Other Ambulatory Visit (HOSPITAL_COMMUNITY): Payer: Commercial Managed Care - HMO

## 2015-09-29 ENCOUNTER — Telehealth: Payer: Self-pay | Admitting: *Deleted

## 2015-09-29 ENCOUNTER — Ambulatory Visit (HOSPITAL_COMMUNITY): Payer: Commercial Managed Care - HMO

## 2015-09-29 NOTE — Telephone Encounter (Signed)
Oncology Nurse Navigator Documentation  Oncology Nurse Navigator Flowsheets 09/29/2015  Navigator Encounter Type Telephone/I went to follow up with Todd Kelly today in chemo.  He apparently has decided not to receive tx and go with Hospice.  I called to check on him and family but were unable to reach them nor leave a message.   Patient Visit Type Follow-up  Treatment Phase Abnormal Scans  Time Spent with Patient 15

## 2015-09-29 NOTE — Telephone Encounter (Signed)
Called to [facility] 

## 2015-09-29 NOTE — Telephone Encounter (Signed)
-----   Message from Curt Bears, MD sent at 09/27/2015  7:18 PM EST ----- Regarding: RE: cancel appts Ok. Thank you. His PCP can be the hospice attending. ----- Message -----    From: Ardeen Garland, RN    Sent: 09/26/2015   1:59 PM      To: Tora Kindred, RPH, Lovena Le Wheat, NT, # Subject: cancel appts                                   From joEllen-"Received call from Tyson Dense, patients wife, who was in my chemo class yesterday 09/25/15. Wife stated after chemo class her husband has decided to not have chemotherapy but to go with hospice instead. Hospice was in patients home today. Wife asked that we cancel all appointments and to let Dr. Earlie Server know. "\ Onc tx request sen to cancel all appts

## 2015-10-06 ENCOUNTER — Ambulatory Visit: Payer: Commercial Managed Care - HMO

## 2015-10-06 ENCOUNTER — Other Ambulatory Visit: Payer: Commercial Managed Care - HMO

## 2015-10-06 ENCOUNTER — Ambulatory Visit: Payer: Commercial Managed Care - HMO | Admitting: Internal Medicine

## 2015-10-14 ENCOUNTER — Other Ambulatory Visit: Payer: Commercial Managed Care - HMO

## 2015-10-21 ENCOUNTER — Other Ambulatory Visit: Payer: Commercial Managed Care - HMO

## 2015-10-21 ENCOUNTER — Ambulatory Visit: Payer: Commercial Managed Care - HMO | Admitting: Oncology

## 2015-10-27 ENCOUNTER — Other Ambulatory Visit: Payer: Commercial Managed Care - HMO

## 2015-11-02 ENCOUNTER — Other Ambulatory Visit: Payer: Self-pay | Admitting: Pulmonary Disease

## 2015-11-03 ENCOUNTER — Other Ambulatory Visit: Payer: Commercial Managed Care - HMO

## 2015-11-10 ENCOUNTER — Other Ambulatory Visit: Payer: Commercial Managed Care - HMO

## 2015-11-10 ENCOUNTER — Ambulatory Visit: Payer: Commercial Managed Care - HMO | Admitting: Internal Medicine

## 2015-11-17 ENCOUNTER — Other Ambulatory Visit: Payer: Commercial Managed Care - HMO

## 2016-05-18 DEATH — deceased

## 2016-10-12 ENCOUNTER — Other Ambulatory Visit: Payer: Self-pay | Admitting: Nurse Practitioner
